# Patient Record
Sex: Female | Born: 1950 | Race: White | Hispanic: No | Marital: Married | Smoking: Former smoker
Health system: Southern US, Community
[De-identification: ages and names within clinical notes are randomized; demographics above are authoritative.]

## PROBLEM LIST (undated history)

## (undated) DIAGNOSIS — G4730 Sleep apnea, unspecified: Secondary | ICD-10-CM

## (undated) DIAGNOSIS — E119 Type 2 diabetes mellitus without complications: Secondary | ICD-10-CM

## (undated) DIAGNOSIS — M792 Neuralgia and neuritis, unspecified: Secondary | ICD-10-CM

## (undated) DIAGNOSIS — K589 Irritable bowel syndrome without diarrhea: Secondary | ICD-10-CM

## (undated) DIAGNOSIS — I10 Essential (primary) hypertension: Secondary | ICD-10-CM

## (undated) DIAGNOSIS — T8859XA Other complications of anesthesia, initial encounter: Secondary | ICD-10-CM

## (undated) DIAGNOSIS — T4145XA Adverse effect of unspecified anesthetic, initial encounter: Secondary | ICD-10-CM

## (undated) DIAGNOSIS — J189 Pneumonia, unspecified organism: Secondary | ICD-10-CM

## (undated) DIAGNOSIS — R519 Headache, unspecified: Secondary | ICD-10-CM

## (undated) DIAGNOSIS — R51 Headache: Secondary | ICD-10-CM

## (undated) HISTORY — PX: GASTRIC BYPASS: SHX52

## (undated) HISTORY — PX: FOOT SURGERY: SHX648

## (undated) HISTORY — PX: TUBAL LIGATION: SHX77

## (undated) HISTORY — PX: DILATION AND CURETTAGE OF UTERUS: SHX78

---

## 2002-11-20 ENCOUNTER — Ambulatory Visit (HOSPITAL_BASED_OUTPATIENT_CLINIC_OR_DEPARTMENT_OTHER): Admission: RE | Admit: 2002-11-20 | Discharge: 2002-11-20 | Payer: Self-pay | Admitting: *Deleted

## 2006-04-07 ENCOUNTER — Ambulatory Visit (HOSPITAL_COMMUNITY): Admission: RE | Admit: 2006-04-07 | Discharge: 2006-04-07 | Payer: Self-pay | Admitting: *Deleted

## 2006-04-07 ENCOUNTER — Encounter: Admission: RE | Admit: 2006-04-07 | Discharge: 2006-07-06 | Payer: Self-pay | Admitting: *Deleted

## 2006-04-12 ENCOUNTER — Ambulatory Visit (HOSPITAL_COMMUNITY): Admission: RE | Admit: 2006-04-12 | Discharge: 2006-04-12 | Payer: Self-pay | Admitting: *Deleted

## 2006-05-25 ENCOUNTER — Inpatient Hospital Stay (HOSPITAL_COMMUNITY): Admission: RE | Admit: 2006-05-25 | Discharge: 2006-05-27 | Payer: Self-pay | Admitting: *Deleted

## 2006-05-26 ENCOUNTER — Encounter: Payer: Self-pay | Admitting: Vascular Surgery

## 2006-05-31 ENCOUNTER — Encounter: Admission: RE | Admit: 2006-05-31 | Discharge: 2006-08-29 | Payer: Self-pay | Admitting: *Deleted

## 2006-11-29 ENCOUNTER — Encounter: Admission: RE | Admit: 2006-11-29 | Discharge: 2007-02-27 | Payer: Self-pay | Admitting: *Deleted

## 2007-06-13 ENCOUNTER — Encounter: Admission: RE | Admit: 2007-06-13 | Discharge: 2007-06-13 | Payer: Self-pay | Admitting: *Deleted

## 2010-11-21 ENCOUNTER — Other Ambulatory Visit: Payer: Self-pay

## 2010-11-21 ENCOUNTER — Other Ambulatory Visit: Payer: Self-pay | Admitting: Family Medicine

## 2010-11-21 DIAGNOSIS — R197 Diarrhea, unspecified: Secondary | ICD-10-CM

## 2010-11-24 ENCOUNTER — Ambulatory Visit
Admission: RE | Admit: 2010-11-24 | Discharge: 2010-11-24 | Disposition: A | Discharge status: Home / Self Care | Payer: PRIVATE HEALTH INSURANCE | Source: Ambulatory Visit | Attending: Family Medicine | Admitting: Family Medicine

## 2010-11-24 DIAGNOSIS — R197 Diarrhea, unspecified: Secondary | ICD-10-CM

## 2010-11-28 ENCOUNTER — Other Ambulatory Visit: Payer: Self-pay | Admitting: Internal Medicine

## 2010-11-28 DIAGNOSIS — R197 Diarrhea, unspecified: Secondary | ICD-10-CM

## 2010-12-05 ENCOUNTER — Other Ambulatory Visit: Payer: PRIVATE HEALTH INSURANCE

## 2010-12-05 NOTE — Op Note (Signed)
NAME:  Rachel White, Rachel White            ACCOUNT NO.:  [id number]   MEDICAL RECORD NO.:  [id number]          PATIENT TYPE:  INP   LOCATION:  0152                         FACILITY:  [facility]   PHYSICIAN:  Alfonse Ras, MD   DATE OF BIRTH:  06/23/1951   DATE OF PROCEDURE:  05/25/2006  DATE OF DISCHARGE:                               OPERATIVE REPORT   PREOPERATIVE DIAGNOSIS:  Medically refractory morbid obesity.   POSTOPERATIVE DIAGNOSIS:  Medically refractory morbid obesity.   PROCEDURE:  Laparoscopic Roux-En-Y gastric bypass, antegastric  antecolic, left facing Roux limb.   SURGEON:  Alfonse Ras, M.D.   ASSISTANT:  Rachel White. Rachel White, M.D.   ANESTHESIA:  General.   DESCRIPTION:  The patient was taken to the operating room and placed in  a supine position. After adequate general anesthesia was induced using  endotracheal tube, the abdomen was prepped and draped in a normal  sterile fashion. Using a small incision in the left upper quadrant and a  12 mm OptiVu, peritoneal access was obtained under direct vision.  Pneumoperitoneum was then obtained.  Additional 12 mm trocars were  placed in the left and mid abdomen.  There was a few amount of omental  adhesions to the anterior abdominal wall around the umbilicus and these  were taken down with the harmonic scalpel.  An additional 5 mm port was  placed in the left abdomen.  The ligament of Treitz was identified and  40 cm distal to that, the small bowel was transected using a 60-mm white  load GIA stapling device.  Additional mesentery was taken down with the  harmonic scalpel.  The distal end was marked with a Penrose drain.  I  counted an additional 100 cm distal to this and a side-to-side  jejunojejunostomy was performed in the standard fashion using a harmonic  scalpel.  There was a through-and-through injury to the posterior wall  of the distal small bowel which was extended to include the enterotomy.  The side-to-side  jejunojejunostomy was performed using a white load 60  mm GIA stapling device.  The defect was closed with running 2-0 Vicryl  sutures.  It was inspected.  It was reinforced with Tisseel.  The  mesenteric defect was closed with running 2-0 silk suture.   I then put the patient in a head-up position and placed a Nathanson  liver retractor to retract the left lateral segment of the liver.  The  angle of His was dissected using the Bovie electrocautery device.  An  area about 4-5 cm distal from the EG junction on the lesser curve was  identified.  The lesser sac was entered using the harmonic scalpel and  bluntly.  Using four serial firings of first gold load and then three  additional blue loads, accomplished the pouch.  Rachel White was in place and  verified the patency of the EG junction.   The Roux limb was then brought up next to the gastric pouch and the  posterior row was sewn with a running 2-0 Vicryl suture.  Of note, there  was a little bit of duskiness to the  tip of the Roux limb but on  inspection over about 2 to 2 1/2 hours, it appeared pink and was  peristalsising.  After the posterior row was placed, a side-to-side  gastrojejunostomy was performed in the standard fashion using a 45 mm  blue load stapling device.  That defect was closed with a running 2-0  Vicryl suture, as well.  The Ewald was then placed down through the  anastomosis and an anterior layer was placed using a running 2-0 Vicryl  suture.  The Rachel White was removed.  Dr. Ezzard White performed upper endoscopy  and we saw a small leak in the anterior portion of the anastomosis and  this was oversewn with interrupted 2-0 Vicryl suture.  On follow-up  endoscopy, there appeared to be a leak at the gastrojejunostomy right at  the left lateral segment.  This was oversewn with a U stitch of 2-0  Vicryl, as well.  On a third endoscopy by Dr. Ezzard White, there was no  evidence of any further bubbles or leaks.  The anastomosis was   reinforced with Tisseel.  Peterson's defect was closed with a running 2-  0 silk suture.  Of note, the gastric remnant was also oversewn partially  with a running interrupted locking 2-0 silk suture.  I did place a 19  Blake drain.  Adequate hemostasis was assured.  The trocars were  removed.  Pneumoperitoneum was released.  The skin was closed with  staples.  The patient tolerated the procedure well, went to PACU and  then to step down because of a history of CPAP.      Alfonse Ras, MD  Electronically Signed     KRE/MEDQ  D:  05/25/2006  T:  05/25/2006  Job:  [id number]

## 2011-10-27 ENCOUNTER — Ambulatory Visit: Payer: Self-pay | Admitting: Family Medicine

## 2011-10-27 VITALS — BP 178/82 | HR 64 | Temp 98.3°F | Resp 16 | Ht 63.5 in | Wt 155.0 lb

## 2011-10-27 DIAGNOSIS — R197 Diarrhea, unspecified: Secondary | ICD-10-CM

## 2011-10-27 MED ORDER — METRONIDAZOLE 250 MG PO TABS: 250.0000 mg | ORAL_TABLET | Freq: Three times a day (TID) | ORAL | Status: DC

## 2011-10-27 NOTE — Progress Notes (Signed)
61 yo unemployed woman with 6 weeks of diarrhea.  Tried cholestyramine, colon health, imodium AD, peptobismol, (probiotics).  Watery diarrhea at least 10 times a day.  Hyperactive BS, no cramps Incredible urgency. No recent antibiotics Becoming dizzy at times One brother died 10 years of cirrhosis, another brother is dying in [place] with cirrhosis, another brother with frostbite up in [place] Mom is doing well Depressed, tearful  O:  NAD Abdomen:  Soft, nontender, hyperactive BS, no mass or HSM  No jaundice  A:  Anxiety, depression, diarrhea  P:  Flagyl, citrocel

## 2011-10-27 NOTE — Patient Instructions (Signed)
Flagyl three times a day citrocel daily  Call if feelings of stress worsen or continue, or if the diarrhea is not resolving

## 2011-11-05 ENCOUNTER — Other Ambulatory Visit: Payer: Self-pay | Admitting: Family Medicine

## 2011-11-07 ENCOUNTER — Other Ambulatory Visit: Payer: Self-pay | Admitting: Family Medicine

## 2011-11-07 NOTE — Telephone Encounter (Signed)
Okay to refill rx x 1

## 2011-11-07 NOTE — Telephone Encounter (Signed)
Dr. Milus Glazier, Please advise [id number] this medication vs. Re-evaluate.

## 2012-02-20 ENCOUNTER — Other Ambulatory Visit: Payer: Self-pay | Admitting: Physician Assistant

## 2012-02-20 NOTE — Telephone Encounter (Signed)
Need chart

## 2012-02-21 NOTE — Telephone Encounter (Signed)
Chart pulled °

## 2012-03-17 ENCOUNTER — Ambulatory Visit: Payer: Self-pay | Admitting: Family Medicine

## 2012-04-28 ENCOUNTER — Ambulatory Visit: Payer: Self-pay | Admitting: Family Medicine

## 2012-08-01 ENCOUNTER — Encounter: Payer: Self-pay | Admitting: Family Medicine

## 2012-08-01 ENCOUNTER — Ambulatory Visit (INDEPENDENT_AMBULATORY_CARE_PROVIDER_SITE_OTHER): Payer: Managed Care, Other (non HMO) | Admitting: Family Medicine

## 2012-08-01 VITALS — BP 182/100 | HR 70 | Temp 98.8°F | Resp 16 | Ht 63.0 in | Wt 156.8 lb

## 2012-08-01 DIAGNOSIS — K529 Noninfective gastroenteritis and colitis, unspecified: Secondary | ICD-10-CM | POA: Insufficient documentation

## 2012-08-01 DIAGNOSIS — G629 Polyneuropathy, unspecified: Secondary | ICD-10-CM | POA: Insufficient documentation

## 2012-08-01 DIAGNOSIS — R197 Diarrhea, unspecified: Secondary | ICD-10-CM

## 2012-08-01 DIAGNOSIS — G609 Hereditary and idiopathic neuropathy, unspecified: Secondary | ICD-10-CM

## 2012-08-01 DIAGNOSIS — Z0000 Encounter for general adult medical examination without abnormal findings: Secondary | ICD-10-CM

## 2012-08-01 DIAGNOSIS — G4700 Insomnia, unspecified: Secondary | ICD-10-CM

## 2012-08-01 LAB — CBC
HCT: 45.7 % (ref 36.0–46.0)
Hemoglobin: 15.9 g/dL — ABNORMAL HIGH (ref 12.0–15.0)
MCH: 29.2 pg (ref 26.0–34.0)
MCHC: 34.8 g/dL (ref 30.0–36.0)
MCV: 83.9 fL (ref 78.0–100.0)
Platelets: 206 10*3/uL (ref 150–400)
RBC: 5.45 MIL/uL — ABNORMAL HIGH (ref 3.87–5.11)
RDW: 13.8 % (ref 11.5–15.5)
WBC: 7.2 10*3/uL (ref 4.0–10.5)

## 2012-08-01 LAB — COMPREHENSIVE METABOLIC PANEL
ALT: 22 U/L (ref 0–35)
AST: 20 U/L (ref 0–37)
Albumin: 4.7 g/dL (ref 3.5–5.2)
Alkaline Phosphatase: 53 U/L (ref 39–117)
BUN: 14 mg/dL (ref 6–23)
CO2: 31 mEq/L (ref 19–32)
Calcium: 9.5 mg/dL (ref 8.4–10.5)
Chloride: 104 mEq/L (ref 96–112)
Creat: 0.93 mg/dL (ref 0.50–1.10)
Glucose, Bld: 82 mg/dL (ref 70–99)
Potassium: 3.8 mEq/L (ref 3.5–5.3)
Sodium: 142 mEq/L (ref 135–145)
Total Bilirubin: 0.4 mg/dL (ref 0.3–1.2)
Total Protein: 6.9 g/dL (ref 6.0–8.3)

## 2012-08-01 LAB — POCT URINALYSIS DIPSTICK
Bilirubin, UA: NEGATIVE
Blood, UA: NEGATIVE
Glucose, UA: NEGATIVE
Leukocytes, UA: NEGATIVE
Nitrite, UA: NEGATIVE
Protein, UA: NEGATIVE
Spec Grav, UA: 1.02
Urobilinogen, UA: 0.2
pH, UA: 6

## 2012-08-01 LAB — LIPID PANEL
Cholesterol: 195 mg/dL (ref 0–200)
HDL: 34 mg/dL — ABNORMAL LOW (ref >39)
LDL Cholesterol: 136 mg/dL — ABNORMAL HIGH (ref 0–99)
Total CHOL/HDL Ratio: 5.7 Ratio
Triglycerides: 126 mg/dL (ref <150)
VLDL: 25 mg/dL (ref 0–40)

## 2012-08-01 MED ORDER — GABAPENTIN 300 MG PO CAPS: 300.0000 mg | ORAL_CAPSULE | Freq: Every evening | ORAL | Status: DC | PRN

## 2012-08-01 MED ORDER — DIPHENOXYLATE-ATROPINE 2.5-0.025 MG PO TABS: 1.0000 | ORAL_TABLET | Freq: Every day | ORAL | Status: DC | PRN

## 2012-08-01 MED ORDER — LORAZEPAM 1 MG PO TABS: 1.0000 mg | ORAL_TABLET | Freq: Every evening | ORAL | Status: DC | PRN

## 2012-08-01 NOTE — Progress Notes (Signed)
Patient ID: Rachel White MRN: [id number], DOB: Nov 16, 1950, 62 y.o. Date of Encounter: 08/01/2012, 1:20 PM  Primary Physician: Elvina Sidle, MD  Chief Complaint: Physical (CPE)  HPI: 62 y.o. y/o female with history of noted below here for CPE.  S/P bypass surgery 2007.  Weight has stabilized at 156 (she had lost to 142 and is now fighting to keep from gaining).  She has chronic diarrhea and uses lomotil and dicyclomine (Bentyl).  Does not believe in immunizations, colonoscopy, paps.  Has not had a mammogram either.   Review of Systems: Consitutional: No fever, chills, fatigue, night sweats, lymphadenopathy, or weight changes. Eyes: No visual changes, eye redness, or discharge. ENT/Mouth: Ears: No otalgia, tinnitus, hearing loss, discharge. Nose: No congestion, rhinorrhea, sinus pain, or epistaxis. Throat: No sore throat, post nasal drip, or teeth pain. Cardiovascular: No CP, palpitations, diaphoresis, DOE, edema, orthopnea, PND. Respiratory: No cough, hemoptysis, SOB, or wheezing. Gastrointestinal: No anorexia, dysphagia, reflux, pain, nausea, vomiting, hematemesis, constipation, BRBPR, or melena. Breast: No discharge, pain, swelling, or mass. Genitourinary: No dysuria, frequency, urgency, hematuria, incontinence, nocturia, amenorrhea, vaginal discharge, pruritis, burning, abnormal bleeding, or pain. Musculoskeletal: No decreased ROM, myalgias, stiffness, joint swelling, or weakness. Skin: No rash, erythema, lesion changes, pain, warmth, jaundice, or pruritis. Neurological: No headache, dizziness, syncope, seizures, tremors, memory loss, coordination problems, or paresthesias. Psychological: No anxiety, depression, hallucinations, SI/HI. Endocrine: No fatigue, polydipsia, polyphagia, polyuria, or known diabetes. All other systems were reviewed and are otherwise negative.  No past medical history on file.   No past surgical history on file.  Home Meds:  Prior to Admission  medications   Medication Sig Start Date End Date Taking? Authorizing Provider  CALCIUM PO Take by mouth daily.   Yes Historical Provider, MD  ferrous sulfate 325 (65 FE) MG tablet Take 325 mg by mouth daily with breakfast. Patient states she takes 65 mg   Yes Historical Provider, MD  Multiple Vitamin (MULTIVITAMIN) tablet Take 1 tablet by mouth daily.   Yes Historical Provider, MD  gabapentin (NEURONTIN) 300 MG capsule TAKE ONE CAPSULE AT BEDTIME 02/20/12   Morrell Riddle, PA-C  metroNIDAZOLE (FLAGYL) 250 MG tablet TAKE 1 TABLET (250 MG TOTAL) BY MOUTH 3 (THREE) TIMES DAILY. 11/07/11   Elvina Sidle, MD    Allergies:  Allergies  Allergen Reactions  . Sulfa Antibiotics   . Zolpidem Tartrate     History   Social History  . Marital Status: Married    Spouse Name: N/A    Number of Children: N/A  . Years of Education: N/A   Occupational History  . Not on file.   Social History Main Topics  . Smoking status: Former Smoker -- 1.0 packs/day for 40 years    Types: Cigarettes  . Smokeless tobacco: Not on file  . Alcohol Use: Not on file  . Drug Use: Not on file  . Sexually Active: Not on file   Other Topics Concern  . Not on file   Social History Narrative  . No narrative on file    No family history on file.  Physical Exam:  160/80 Blood pressure 182/100, pulse 70, temperature 98.8 F (37.1 C), temperature source Oral, resp. rate 16, height 5\' 3"  (1.6 m), weight 156 lb 12.8 oz (71.124 kg), SpO2 98.00%., Body mass index is 27.78 kg/(m^2). General: Well developed, well nourished, in no acute distress. HEENT: Normocephalic, atraumatic. Conjunctiva pink, sclera non-icteric. Pupils 2 mm constricting to 1 mm, round, regular, and equally reactive to light and accomodation.  EOMI. Internal auditory canal clear. TMs with good cone of light and without pathology. Nasal mucosa pink. Nares are without discharge. No sinus tenderness. Oral mucosa pink. Dentition= good. Pharynx without exudate.     Neck: Supple. Trachea midline. No thyromegaly. Full ROM. No lymphadenopathy. Lungs: Clear to auscultation bilaterally without wheezes, rales, or rhonchi. Breathing is of normal effort and unlabored. Cardiovascular: RRR with S1 S2. No murmurs, rubs, or gallops appreciated. Distal pulses 2+ symmetrically. No carotid or abdominal bruits. Breast: Symmetrical. No masses. Nipples without discharge. Abdomen: Soft, non-tender, non-distended with normoactive bowel sounds. No hepatosplenomegaly or masses. No rebound/guarding. No CVA tenderness. Without hernias.  Musculoskeletal: Full range of motion and 5/5 strength throughout. Without swelling, atrophy, tenderness, crepitus, or warmth. Extremities without clubbing, cyanosis, or edema. Calves supple. Skin: Warm and moist without erythema, ecchymosis, wounds, or rash. Neuro: A+Ox3. CN II-XII grossly intact. Moves all extremities spontaneously. Full sensation throughout. Normal gait. DTR 2+ throughout upper and lower extremities. Finger to nose intact. Psych:  Responds to questions appropriately with a normal affect.    Assessment/Plan:  62 y.o. y/o female here for CPE, refuses preventive care 1. Annual physical exam  CBC, POCT urinalysis dipstick, Comprehensive metabolic panel, Lipid panel  2. Peripheral neuropathy  gabapentin (NEURONTIN) 300 MG capsule  3. Chronic diarrhea  diphenoxylate-atropine (LOMOTIL) 2.5-0.025 MG per tablet  4. Insomnia  LORazepam (ATIVAN) 1 MG tablet    -  Signed, Elvina Sidle, MD 08/01/2012 1:20 PM

## 2012-08-01 NOTE — Patient Instructions (Addendum)

## 2012-08-01 NOTE — Progress Notes (Signed)
Patient ID: Rachel White MRN: [id number], DOB: [birth year], 62 y.o. Date of Encounter: 08/01/2012, 1:44 PM  Primary Physician: Elvina Sidle, MD  Chief Complaint: Physical (CPE)  HPI: 62 y.o. y/o female with history of noted below here for CPE.  Doing well. Recently came back from [place] where her brother with cirrhosis lives, and her mother (who ran off with another man during patient's childhood) lives after suffering from an MI Husband works at [employer], along with her son. [place] no longer works Her chronic diarrhea is controlled with lomotil and bentyl.  She refuses colonoscopy, pap, mammogram, and all immunizations.  She is here to retain her health insurance (required by [employer])  LMP: 12 year ago Pap: 20 year ago MMG: never Review of Systems: Consitutional: No fever, chills, fatigue, night sweats, lymphadenopathy, or weight changes. Eyes: No visual changes, eye redness, or discharge. ENT/Mouth: Ears: No otalgia, tinnitus, hearing loss, discharge. Nose: No congestion, rhinorrhea, sinus pain, or epistaxis. Throat: No sore throat, post nasal drip, or teeth pain. Cardiovascular: No CP, palpitations, diaphoresis, DOE, edema, orthopnea, PND. Respiratory: No cough, hemoptysis, SOB, or wheezing. Gastrointestinal: No anorexia, dysphagia, reflux, pain, nausea, vomiting, hematemesis, diarrhea, constipation, BRBPR, or melena. Breast: No discharge, pain, swelling, or mass. Genitourinary: No dysuria, frequency, urgency, hematuria, incontinence, nocturia, amenorrhea, vaginal discharge, pruritis, burning, abnormal bleeding, or pain. Musculoskeletal: No decreased ROM, myalgias, stiffness, joint swelling, or weakness. Skin: No rash, erythema, lesion changes, pain, warmth, jaundice, or pruritis. Neurological: No headache, dizziness, syncope, seizures, tremors, memory loss, coordination problems, or paresthesias. Psychological: No anxiety, depression, hallucinations,  SI/HI. Endocrine: No fatigue, polydipsia, polyphagia, polyuria, or known diabetes. All other systems were reviewed and are otherwise negative.  No past medical history on file.   No past surgical history on file.  Home Meds:  Prior to Admission medications   Medication Sig Start Date End Date Taking? Authorizing Provider  CALCIUM PO Take by mouth daily.   Yes Historical Provider, MD  DICYCLOMINE HCL PO Take by mouth as needed.   Yes Historical Provider, MD  ferrous sulfate 325 (65 FE) MG tablet Take 325 mg by mouth daily with breakfast. Patient states she takes 65 mg   Yes Historical Provider, MD  Multiple Vitamin (MULTIVITAMIN) tablet Take 1 tablet by mouth daily.   Yes Historical Provider, MD  diphenoxylate-atropine (LOMOTIL) 2.5-0.025 MG per tablet Take 1 tablet by mouth daily as needed for diarrhea or loose stools. 08/01/12   Elvina Sidle, MD  gabapentin (NEURONTIN) 300 MG capsule Take 1 capsule (300 mg total) by mouth at bedtime and may repeat dose one time if needed. 08/01/12   Elvina Sidle, MD  LORazepam (ATIVAN) 1 MG tablet Take 1 tablet (1 mg total) by mouth at bedtime and may repeat dose one time if needed. 08/01/12   Elvina Sidle, MD  metroNIDAZOLE (FLAGYL) 250 MG tablet TAKE 1 TABLET (250 MG TOTAL) BY MOUTH 3 (THREE) TIMES DAILY. 11/07/11   Elvina Sidle, MD    Allergies:  Allergies  Allergen Reactions  . Sulfa Antibiotics   . Zolpidem Tartrate     History   Social History  . Marital Status: Married    Spouse Name: N/A    Number of Children: N/A  . Years of Education: N/A   Occupational History  . Not on file.   Social History Main Topics  . Smoking status: Former Smoker -- 1.0 packs/day for 40 years    Types: Cigarettes  . Smokeless tobacco: Not on file  . Alcohol Use: Not  on file  . Drug Use: Not on file  . Sexually Active: Not on file   Other Topics Concern  . Not on file   Social History Narrative  . No narrative on file    No family  history on file.  Physical Exam:  160/80 on recheck Blood pressure 182/100, pulse 70, temperature 98.8 F (37.1 C), temperature source Oral, resp. rate 16, height 5\' 3"  (1.6 m), weight 156 lb 12.8 oz (71.124 kg), SpO2 98.00%., Body mass index is 27.78 kg/(m^2). General: Well developed, well nourished, in no acute distress. HEENT: Normocephalic, atraumatic. Conjunctiva pink, sclera non-icteric. Pupils 2 mm constricting to 1 mm, round, regular, and equally reactive to light and accomodation. EOMI. Internal auditory canal clear. TMs with good cone of light and without pathology. Nasal mucosa pink. Nares are without discharge. No sinus tenderness. Oral mucosa pink. Dentition missing few teeth, but remaining teeth appear healthy. Pharynx without exudate.   Neck: Supple. Trachea midline. No thyromegaly. Full ROM. No lymphadenopathy. Lungs: Clear to auscultation bilaterally without wheezes, rales, or rhonchi. Breathing is of normal effort and unlabored. Cardiovascular: RRR with S1 S2. No murmurs, rubs, or gallops appreciated. Distal pulses 2+ symmetrically. No carotid or abdominal bruits Abdomen: Soft, non-tender, non-distended with normoactive bowel sounds. No hepatosplenomegaly or masses. No rebound/guarding. No CVA tenderness. Without hernias.  Musculoskeletal: Full range of motion and 5/5 strength throughout. Without swelling, atrophy, tenderness, crepitus, or warmth. Extremities without clubbing, cyanosis, or edema. Calves supple. Skin: Warm and moist without erythema, ecchymosis, wounds, or rash. Neuro: A+Ox3. CN II-XII grossly intact. Moves all extremities spontaneously. Full sensation throughout. Normal gait. DTR 2+ throughout upper and lower extremities. Finger to nose intact. Psych:  Responds to questions appropriately with a normal affect.   Studies: CBC, CMET, Lipid, TSH, Vitamin b12  Assessment/Plan:  62 y.o. y/o female here for CPE who doesn't believe in preventive health opportunities 1.  Annual physical exam  CBC, POCT urinalysis dipstick, Comprehensive metabolic panel, Lipid panel  2. Peripheral neuropathy  gabapentin (NEURONTIN) 300 MG capsule, Vitamin B12, TSH  3. Chronic diarrhea  diphenoxylate-atropine (LOMOTIL) 2.5-0.025 MG per tablet  4. Insomnia  LORazepam (ATIVAN) 1 MG tablet   Signed, Elvina Sidle, MD 08/01/2012 1:44 PM

## 2012-08-02 LAB — TSH: TSH: 3.458 u[IU]/mL (ref 0.350–4.500)

## 2012-08-02 LAB — VITAMIN B12: Vitamin B-12: 454 pg/mL (ref 211–911)

## 2012-08-08 ENCOUNTER — Telehealth: Payer: Self-pay

## 2012-08-08 MED ORDER — NEOMYCIN-POLYMYXIN-HC 3.5-10000-1 OT SUSP: 3.0000 [drp] | Freq: Four times a day (QID) | OTIC | Status: DC

## 2012-08-08 NOTE — Telephone Encounter (Signed)
She does not want the metronidazole, she wants something for her ear, states she spoke to you about this when she was here, but I do not see this in the OV notes, Dione Mccombie

## 2012-08-08 NOTE — Telephone Encounter (Signed)
Thanks, I called patient to advise.  

## 2012-08-08 NOTE — Telephone Encounter (Signed)
I need to know what form of metronidazole she takes.  Is this for a skin condition?

## 2012-08-08 NOTE — Telephone Encounter (Signed)
Ok , its done !

## 2012-08-08 NOTE — Telephone Encounter (Signed)
We had this listed as a historical medication, is she taking this? What does she need it for? She states she does not need this one, she needs Rx for her ear, states she was having trouble with her ear, and you were to give her a med for this, but was not sent in for her. Please advise. Amy

## 2012-08-08 NOTE — Telephone Encounter (Signed)
PT was seen by Dr. Elbert Ewings last week, he was supposed to call in RX for metronidazole. This is on her AVS but [facility] [address] has no record of this. Pt came by this morning and I advised I would put in a phone msg. [phone]

## 2012-11-21 ENCOUNTER — Ambulatory Visit: Payer: Managed Care, Other (non HMO) | Admitting: Family Medicine

## 2012-11-21 ENCOUNTER — Encounter: Payer: Self-pay | Admitting: Family Medicine

## 2012-11-21 VITALS — BP 126/92 | HR 67 | Temp 98.1°F | Resp 16 | Ht 63.5 in | Wt 151.2 lb

## 2012-11-21 DIAGNOSIS — R0989 Other specified symptoms and signs involving the circulatory and respiratory systems: Secondary | ICD-10-CM

## 2012-11-21 DIAGNOSIS — G629 Polyneuropathy, unspecified: Secondary | ICD-10-CM

## 2012-11-21 DIAGNOSIS — R109 Unspecified abdominal pain: Secondary | ICD-10-CM

## 2012-11-21 MED ORDER — GABAPENTIN 300 MG PO CAPS: 300.0000 mg | ORAL_CAPSULE | Freq: Every evening | ORAL | Status: DC | PRN

## 2012-11-21 MED ORDER — PREGABALIN 100 MG PO CAPS: ORAL_CAPSULE | ORAL | Status: DC

## 2012-11-21 NOTE — Progress Notes (Signed)
62 yo woman with chronic cold intolerance, dry scaly and itchy skin upper backs and forearms, and right flank pain (recent).  Her mother had kidney failure.  Patient has been working in garden, Dietitian.  No diarrhea since December.  Occasional leg cramps.  Married, plans to visit brother for July in [place].  Peripheral neuropathy in feet persists.  Lyrica worked better than gabapentin but insurance wouldn't cover it in the past.  Now has different insurance  Objective:  NAD Abdomen:  Palpable aorta which is tender.  No HSM.    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  Peripheral neuropathy should improve with Lyrica if insurance will cover.    Plan: check abdominal ultrasound.  Patient will fill either neurontin or Lyrica, preferring the latter if covererd by [employer]  Peripheral neuropathy - Plan: gabapentin (NEURONTIN) 300 MG capsule, pregabalin (LYRICA) 100 MG capsule  Right flank pain - Plan: US Abdomen Complete  Palpable abd. aorta - Plan: US Abdomen Complete  Signed, Elvina Sidle, MD

## 2012-11-28 ENCOUNTER — Ambulatory Visit
Admission: RE | Admit: 2012-11-28 | Discharge: 2012-11-28 | Disposition: A | Discharge status: Home / Self Care | Payer: Managed Care, Other (non HMO) | Source: Ambulatory Visit | Attending: Family Medicine | Admitting: Family Medicine

## 2012-11-28 DIAGNOSIS — R0989 Other specified symptoms and signs involving the circulatory and respiratory systems: Secondary | ICD-10-CM

## 2012-11-28 DIAGNOSIS — R109 Unspecified abdominal pain: Secondary | ICD-10-CM

## 2013-12-10 ENCOUNTER — Ambulatory Visit: Payer: Self-pay | Admitting: Family Medicine

## 2013-12-10 VITALS — BP 148/72 | HR 89 | Temp 99.0°F | Resp 18 | Ht 63.0 in | Wt 153.0 lb

## 2013-12-10 DIAGNOSIS — R300 Dysuria: Secondary | ICD-10-CM

## 2013-12-10 DIAGNOSIS — R6883 Chills (without fever): Secondary | ICD-10-CM

## 2013-12-10 DIAGNOSIS — N390 Urinary tract infection, site not specified: Secondary | ICD-10-CM

## 2013-12-10 MED ORDER — CIPROFLOXACIN HCL 250 MG PO TABS: 250.0000 mg | ORAL_TABLET | Freq: Two times a day (BID) | ORAL | Status: DC

## 2013-12-10 NOTE — Progress Notes (Signed)
Patient ID: Rachel White MRN: [id number], DOB: [birth year], 63 y.o. Date of Encounter: 12/10/2013, 3:43 PM  Primary Physician: Robyn Haber, MD  Chief Complaint:  Chief Complaint  Patient presents with  . Urinary Tract Infection    today, bladder pressure and dysuria    HPI: 63 y.o. year old female presents with 1/2 day history of dysuria, urgency, and frequency. Last UTI was over 6 months ago No hematuria  No sick contacts, recent antibiotics, or recent travels.   No vaginal discharge, back pain, fever  History reviewed. No pertinent past medical history.   Home Meds: Prior to Admission medications   Medication Sig Start Date End Date Taking? Authorizing Provider  CALCIUM PO Take by mouth daily.   Yes Historical Provider, MD  DICYCLOMINE HCL PO Take by mouth as needed.   Yes Historical Provider, MD  diphenoxylate-atropine (LOMOTIL) 2.5-0.025 MG per tablet Take 1 tablet by mouth daily as needed for diarrhea or loose stools. 08/01/12  Yes Robyn Haber, MD  gabapentin (NEURONTIN) 300 MG capsule Take 1 capsule (300 mg total) by mouth at bedtime and may repeat dose one time if needed. 11/21/12  Yes Robyn Haber, MD  LORazepam (ATIVAN) 1 MG tablet Take 1 tablet (1 mg total) by mouth at bedtime and may repeat dose one time if needed. 08/01/12  Yes Robyn Haber, MD    Allergies:  Allergies  Allergen Reactions  . Sulfa Antibiotics   . Zolpidem Tartrate     History   Social History  . Marital Status: Married    Spouse Name: N/A    Number of Children: N/A  . Years of Education: N/A   Occupational History  . Not on file.   Social History Main Topics  . Smoking status: Former Smoker -- 1.00 packs/day for 40 years    Types: Cigarettes  . Smokeless tobacco: Not on file  . Alcohol Use: Not on file  . Drug Use: Not on file  . Sexual Activity: Not on file   Other Topics Concern  . Not on file   Social History Narrative  . No narrative on file      Review of Systems: Constitutional: negative for chills, fever, night sweats or weight changes Cardiovascular: negative for chest pain or palpitations Respiratory: negative for hemoptysis, wheezing, or shortness of breath Abdominal: negative for abdominal pain, nausea, vomiting or diarrhea Dermatological: negative for rash Neurologic: negative for headache   Physical Exam: Blood pressure 148/72, pulse 89, temperature 99 F (37.2 C), resp. rate 18, height 5\' 3"  (1.6 m), weight 153 lb (69.4 kg), SpO2 97.00%., Body mass index is 27.11 kg/(m^2). General: Well developed, well nourished, in no acute distress. Head: Normocephalic, atraumatic, eyes without discharge, sclera non-icteric, nares are congested. Bilateral auditory canals clear, TM's are without perforation, pearly grey with reflective cone of light bilaterally. Serous effusion bilaterally behind TM's. Maxillary sinus TTP. Oral cavity moist, dentition normal. Posterior pharynx with post nasal drip and mild erythema. No peritonsillar abscess or tonsillar exudate. Neck: Supple. No thyromegaly. Full ROM. No lymphadenopathy. Lungs: Coarse breath sounds bilaterally without Clear bilaterally to auscultation without wheezes, rales, or rhonchi. Breathing is unlabored.  Heart: RRR with S1 S2. No murmurs, rubs, or gallops appreciated. Abdomen: Soft, non-tender, non-distended with normoactive bowel sounds. No hepatosplenomegaly. No rebound/guarding. No obvious abdominal masses. McBurney's, Rovsing's, Iliopsoas, and table jar all negative. Msk:  Strength and tone normal for age. Extremities: No clubbing or cyanosis. No edema. Neuro: Alert and oriented X 3. Moves all  extremities spontaneously. CNII-XII grossly in tact. Psych:  Responds to questions appropriately with a normal affect.   Labs: Results for orders placed in visit on 08/01/12  CBC      Result Value Ref Range   WBC 7.2  4.0 - 10.5 K/uL   RBC 5.45 (*) 3.87 - 5.11 MIL/uL   Hemoglobin  15.9 (*) 12.0 - 15.0 g/dL   HCT 45.7  36.0 - 46.0 %   MCV 83.9  78.0 - 100.0 fL   MCH 29.2  26.0 - 34.0 pg   MCHC 34.8  30.0 - 36.0 g/dL   RDW 13.8  11.5 - 15.5 %   Platelets 206  150 - 400 K/uL  COMPREHENSIVE METABOLIC PANEL      Result Value Ref Range   Sodium 142  135 - 145 mEq/L   Potassium 3.8  3.5 - 5.3 mEq/L   Chloride 104  96 - 112 mEq/L   CO2 31  19 - 32 mEq/L   Glucose, Bld 82  70 - 99 mg/dL   BUN 14  6 - 23 mg/dL   Creat 0.93  0.50 - 1.10 mg/dL   Total Bilirubin 0.4  0.3 - 1.2 mg/dL   Alkaline Phosphatase 53  39 - 117 U/L   AST 20  0 - 37 U/L   ALT 22  0 - 35 U/L   Total Protein 6.9  6.0 - 8.3 g/dL   Albumin 4.7  3.5 - 5.2 g/dL   Calcium 9.5  8.4 - 10.5 mg/dL  LIPID PANEL      Result Value Ref Range   Cholesterol 195  0 - 200 mg/dL   Triglycerides 126  <150 mg/dL   HDL 34 (*) >39 mg/dL   Total CHOL/HDL Ratio 5.7     VLDL 25  0 - 40 mg/dL   LDL Cholesterol 136 (*) 0 - 99 mg/dL  VITAMIN B12      Result Value Ref Range   Vitamin B-12 454  211 - 911 pg/mL  TSH      Result Value Ref Range   TSH 3.458  0.350 - 4.500 uIU/mL  POCT URINALYSIS DIPSTICK      Result Value Ref Range   Color, UA yellow     Clarity, UA clear     Glucose, UA neg     Bilirubin, UA neg     Ketones, UA trace     Spec Grav, UA 1.020     Blood, UA neg     pH, UA 6.0     Protein, UA neg     Urobilinogen, UA 0.2     Nitrite, UA neg     Leukocytes, UA Negative        ASSESSMENT AND PLAN:  63 y.o. year old female with symptoms of UTI -Dysuria - Plan: ciprofloxacin (CIPRO) 250 MG tablet  Chills - Plan: ciprofloxacin (CIPRO) 250 MG tablet   -Mucinex -Tylenol/Motrin prn -Rest/fluids -RTC precautions -RTC 3-5 days if no improvement  Signed, Robyn Haber, MD 12/10/2013 3:43 PM

## 2013-12-10 NOTE — Patient Instructions (Signed)
Urinary Tract Infection  Urinary tract infections (UTIs) can develop anywhere along your urinary tract. Your urinary tract is your body's drainage system for removing wastes and extra water. Your urinary tract includes two kidneys, two ureters, a bladder, and a urethra. Your kidneys are a pair of bean-shaped organs. Each kidney is about the size of your fist. They are located below your ribs, one on each side of your spine.  CAUSES  Infections are caused by microbes, which are microscopic organisms, including fungi, viruses, and bacteria. These organisms are so small that they can only be seen through a microscope. Bacteria are the microbes that most commonly cause UTIs.  SYMPTOMS   Symptoms of UTIs may vary by age and gender of the patient and by the location of the infection. Symptoms in young women typically include a frequent and intense urge to urinate and a painful, burning feeling in the bladder or urethra during urination. Older women and men are more likely to be tired, shaky, and weak and have muscle aches and abdominal pain. A fever may mean the infection is in your kidneys. Other symptoms of a kidney infection include pain in your back or sides below the ribs, nausea, and vomiting.  DIAGNOSIS  To diagnose a UTI, your caregiver will ask you about your symptoms. Your caregiver also will ask to provide a urine sample. The urine sample will be tested for bacteria and white blood cells. White blood cells are made by your body to help fight infection.  TREATMENT   Typically, UTIs can be treated with medication. Because most UTIs are caused by a bacterial infection, they usually can be treated with the use of antibiotics. The choice of antibiotic and length of treatment depend on your symptoms and the type of bacteria causing your infection.  HOME CARE INSTRUCTIONS   If you were prescribed antibiotics, take them exactly as your caregiver instructs you. Finish the medication even if you feel better after you  have only taken some of the medication.   Drink enough water and fluids to keep your urine clear or pale yellow.   Avoid caffeine, tea, and carbonated beverages. They tend to irritate your bladder.   Empty your bladder often. Avoid holding urine for long periods of time.   Empty your bladder before and after sexual intercourse.   After a bowel movement, women should cleanse from front to back. Use each tissue only once.  SEEK MEDICAL CARE IF:    You have back pain.   You develop a fever.   Your symptoms do not begin to resolve within 3 days.  SEEK IMMEDIATE MEDICAL CARE IF:    You have severe back pain or lower abdominal pain.   You develop chills.   You have nausea or vomiting.   You have continued burning or discomfort with urination.  MAKE SURE YOU:    Understand these instructions.   Will watch your condition.   Will get help right away if you are not doing well or get worse.  Document Released: 04/15/2005 Document Revised: 01/05/2012 Document Reviewed: 08/14/2011  [employer] Patient Information 2014 [employer]

## 2013-12-15 ENCOUNTER — Telehealth: Payer: Self-pay

## 2013-12-15 NOTE — Telephone Encounter (Signed)
LM for pt to RTC- we can not give a refill on this medication. Last OV states to RTC in 5 days if no improvement.

## 2013-12-15 NOTE — Telephone Encounter (Signed)
Patient is requesting a refill on Ciprofloxacin HCL 250 MG. [facility] on [address]. Patient request for her prescription to be filled by Thursday July 4th.

## 2014-06-06 ENCOUNTER — Ambulatory Visit (INDEPENDENT_AMBULATORY_CARE_PROVIDER_SITE_OTHER): Payer: No Typology Code available for payment source | Admitting: Emergency Medicine

## 2014-06-06 VITALS — BP 160/90 | HR 73 | Temp 98.7°F | Resp 18 | Ht 63.25 in | Wt 152.0 lb

## 2014-06-06 DIAGNOSIS — R6883 Chills (without fever): Secondary | ICD-10-CM

## 2014-06-06 DIAGNOSIS — R300 Dysuria: Secondary | ICD-10-CM

## 2014-06-06 DIAGNOSIS — N3000 Acute cystitis without hematuria: Secondary | ICD-10-CM

## 2014-06-06 LAB — POCT URINALYSIS DIPSTICK
BILIRUBIN UA: NEGATIVE
Glucose, UA: NEGATIVE
KETONES UA: NEGATIVE
NITRITE UA: NEGATIVE
PROTEIN UA: 100
Spec Grav, UA: 1.02
Urobilinogen, UA: 1
pH, UA: 7

## 2014-06-06 LAB — POCT UA - MICROSCOPIC ONLY
Casts, Ur, LPF, POC: NEGATIVE
Crystals, Ur, HPF, POC: NEGATIVE
MUCUS UA: NEGATIVE
YEAST UA: NEGATIVE

## 2014-06-06 MED ORDER — PHENAZOPYRIDINE HCL 200 MG PO TABS: 200.0000 mg | ORAL_TABLET | Freq: Three times a day (TID) | ORAL | Status: DC | PRN

## 2014-06-06 MED ORDER — CIPROFLOXACIN HCL 250 MG PO TABS: 250.0000 mg | ORAL_TABLET | Freq: Two times a day (BID) | ORAL | Status: DC

## 2014-06-06 NOTE — Patient Instructions (Signed)

## 2014-06-06 NOTE — Progress Notes (Signed)
[facility] [address], [place] [address] [phone]  Date:  06/06/2014   Name:  Rachel White   DOB:  03/02/1951   MRN:  [id number]  PCP:  Robyn Haber, MD    Chief Complaint: Urinary Tract Infection; Urinary Frequency; and Urinary Urgency   History of Present Illness:  Rachel White is a 63 y.o. very pleasant female patient who presents with the following:  History of gastric bypass and lost sufficient weight her blood pressure drop and now off medications Has had elevated pressure and is resistant to taking antihypertensives again Now to the office with dysuria, urgency and frequency No nausea or vomiting. No stool change.  No rash No fever or chills. No improvement with over the counter medications or other home remedies.  Denies other complaint or health concern today.   Patient Active Problem List   Diagnosis Date Noted  . Peripheral neuropathy 08/01/2012  . Insomnia 08/01/2012  . Chronic diarrhea 08/01/2012    No past medical history on file.  No past surgical history on file.  History  Substance Use Topics  . Smoking status: Former Smoker -- 1.00 packs/day for 40 years    Types: Cigarettes  . Smokeless tobacco: Never Used  . Alcohol Use: Not on file    No family history on file.  Allergies  Allergen Reactions  . Sulfa Antibiotics   . Zolpidem Tartrate     Medication list has been reviewed and updated.  Current Outpatient Prescriptions on File Prior to Visit  Medication Sig Dispense Refill  . CALCIUM PO Take by mouth daily.    . ciprofloxacin (CIPRO) 250 MG tablet Take 1 tablet (250 mg total) by mouth 2 (two) times daily. 10 tablet 0  . DICYCLOMINE HCL PO Take by mouth as needed.    . diphenoxylate-atropine (LOMOTIL) 2.5-0.025 MG per tablet Take 1 tablet by mouth daily as needed for diarrhea or loose stools. 90 tablet 3  . gabapentin (NEURONTIN) 300 MG capsule Take 1 capsule (300 mg total) by mouth at  bedtime and may repeat dose one time if needed. 90 capsule 3  . LORazepam (ATIVAN) 1 MG tablet Take 1 tablet (1 mg total) by mouth at bedtime and may repeat dose one time if needed. 180 tablet 1   No current facility-administered medications on file prior to visit.    Review of Systems:  As per HPI, otherwise negative.    Physical Examination: Filed Vitals:   06/06/14 0840  BP: 160/90  Pulse: 73  Temp: 98.7 F (37.1 C)  Resp: 18   Filed Vitals:   06/06/14 0840  Height: 5' 3.25" (1.607 m)  Weight: 152 lb (68.947 kg)   Body mass index is 26.7 kg/(m^2). Ideal Body Weight: Weight in (lb) to have BMI = 25: 142   GEN: WDWN, NAD, Non-toxic, Alert & Oriented x 3 HEENT: Atraumatic, Normocephalic.  Ears and Nose: No external deformity. EXTR: No clubbing/cyanosis/edema NEURO: Normal gait.  PSYCH: Normally interactive. Conversant. Not depressed or anxious appearing.  Calm demeanor.  BACK   No CVA tenderness Abd:  Soft not tender   Assessment and Plan: Acute cystitis cipro  Pyridium  Signed,  Ellison Carwin, MD   Results for orders placed or performed in visit on 06/06/14  POCT urinalysis dipstick  Result Value Ref Range   Color, UA yellow    Clarity, UA cloudy    Glucose, UA neg    Bilirubin, UA neg    Ketones,  UA neg    Spec Grav, UA 1.020    Blood, UA large    pH, UA 7.0    Protein, UA 100    Urobilinogen, UA 1.0    Nitrite, UA neg    Leukocytes, UA moderate (2+)   POCT UA - Microscopic Only  Result Value Ref Range   WBC, Ur, HPF, POC 2-4    RBC, urine, microscopic 9-16    Bacteria, U Microscopic trace    Mucus, UA neg    Epithelial cells, urine per micros 0-1    Crystals, Ur, HPF, POC neg    Casts, Ur, LPF, POC neg    Yeast, UA neg

## 2014-10-13 ENCOUNTER — Emergency Department (HOSPITAL_COMMUNITY)
Admission: EM | Admit: 2014-10-13 | Discharge: 2014-10-14 | Disposition: A | Discharge status: Home / Self Care | Payer: No Typology Code available for payment source | Attending: Emergency Medicine | Admitting: Emergency Medicine

## 2014-10-13 ENCOUNTER — Emergency Department (HOSPITAL_COMMUNITY): Payer: No Typology Code available for payment source

## 2014-10-13 ENCOUNTER — Encounter (HOSPITAL_COMMUNITY): Payer: Self-pay

## 2014-10-13 DIAGNOSIS — S82102A Unspecified fracture of upper end of left tibia, initial encounter for closed fracture: Secondary | ICD-10-CM | POA: Insufficient documentation

## 2014-10-13 DIAGNOSIS — I10 Essential (primary) hypertension: Secondary | ICD-10-CM | POA: Diagnosis not present

## 2014-10-13 DIAGNOSIS — W108XXA Fall (on) (from) other stairs and steps, initial encounter: Secondary | ICD-10-CM | POA: Insufficient documentation

## 2014-10-13 DIAGNOSIS — Y998 Other external cause status: Secondary | ICD-10-CM | POA: Diagnosis not present

## 2014-10-13 DIAGNOSIS — Y9289 Other specified places as the place of occurrence of the external cause: Secondary | ICD-10-CM | POA: Diagnosis not present

## 2014-10-13 DIAGNOSIS — Z720 Tobacco use: Secondary | ICD-10-CM | POA: Insufficient documentation

## 2014-10-13 DIAGNOSIS — E119 Type 2 diabetes mellitus without complications: Secondary | ICD-10-CM | POA: Insufficient documentation

## 2014-10-13 DIAGNOSIS — G629 Polyneuropathy, unspecified: Secondary | ICD-10-CM | POA: Insufficient documentation

## 2014-10-13 DIAGNOSIS — Y9389 Activity, other specified: Secondary | ICD-10-CM | POA: Insufficient documentation

## 2014-10-13 DIAGNOSIS — W19XXXA Unspecified fall, initial encounter: Secondary | ICD-10-CM

## 2014-10-13 DIAGNOSIS — Z79899 Other long term (current) drug therapy: Secondary | ICD-10-CM | POA: Diagnosis not present

## 2014-10-13 DIAGNOSIS — S8992XA Unspecified injury of left lower leg, initial encounter: Secondary | ICD-10-CM | POA: Diagnosis present

## 2014-10-13 DIAGNOSIS — S82142A Displaced bicondylar fracture of left tibia, initial encounter for closed fracture: Secondary | ICD-10-CM

## 2014-10-13 HISTORY — DX: Essential (primary) hypertension: I10

## 2014-10-13 HISTORY — DX: Type 2 diabetes mellitus without complications: E11.9

## 2014-10-13 HISTORY — DX: Neuralgia and neuritis, unspecified: M79.2

## 2014-10-13 MED ORDER — OXYCODONE-ACETAMINOPHEN 5-325 MG PO TABS: 1.0000 | ORAL_TABLET | Freq: Four times a day (QID) | ORAL | Status: DC | PRN

## 2014-10-13 MED ORDER — DOCUSATE SODIUM 100 MG PO CAPS: 100.0000 mg | ORAL_CAPSULE | Freq: Two times a day (BID) | ORAL | Status: DC

## 2014-10-13 MED ORDER — OXYCODONE-ACETAMINOPHEN 5-325 MG PO TABS: 1.0000 | ORAL_TABLET | Freq: Once | ORAL | Status: DC

## 2014-10-13 MED ORDER — ONDANSETRON HCL 4 MG/2ML IJ SOLN
4.0000 mg | Freq: Once | INTRAMUSCULAR | Status: AC
  Administered 2014-10-13: 4 mg via INTRAVENOUS
  Filled 2014-10-13: qty 2

## 2014-10-13 MED ORDER — OXYCODONE-ACETAMINOPHEN 5-325 MG PO TABS
2.0000 | ORAL_TABLET | Freq: Once | ORAL | Status: AC
  Administered 2014-10-13: 2 via ORAL
  Filled 2014-10-13: qty 2

## 2014-10-13 MED ORDER — HYDROMORPHONE HCL 1 MG/ML IJ SOLN
1.0000 mg | Freq: Once | INTRAMUSCULAR | Status: AC
  Administered 2014-10-13: 1 mg via INTRAVENOUS
  Filled 2014-10-13: qty 1

## 2014-10-13 NOTE — ED Notes (Signed)
Pt was coming down stairs at her home and slipped down last 2 stairs and landed on LT knee, feeling a pop.  EMS reports positive pedal pulses and +CMS, BP 156/102, 80, 18.

## 2014-10-13 NOTE — ED Provider Notes (Signed)
CSN: [id number]     Arrival date & time 10/13/14  1843 History   First MD Initiated Contact with Patient 10/13/14 1845     Chief Complaint  Patient presents with  . Knee Pain     (Consider location/radiation/quality/duration/timing/severity/associated sxs/prior Treatment) HPI Comments: Patient presents with complaint of left knee pain. Pain beginning acutely when patient fell down approximately 2 stairs. She states that she fell and sat on the stair however jammed her foot when she did this felt a crack in her knee. She was unable to bear weight and EMS was called to transport to the hospital. No treatments prior to arrival. Last oral intake at 5 PM. Patient has baseline neuropathy in her left foot from previous diabetes (DM cured with gastric bypass surgery). No previous knee surgeries. Patient given Percocet upon arrival to the emergency department. Course is constant. Movement makes the pain worse. Holding still makes it better.  Patient is a 64 y.o. female presenting with knee pain. The history is provided by the patient.  Knee Pain Associated symptoms: no back pain and no neck pain     Past Medical History  Diagnosis Date  . Hypertension   . Neuropathic pain   . Diabetes mellitus without complication    Past Surgical History  Procedure Laterality Date  . Gastric bypass    . Foot surgery    . Dilation and curettage of uterus     No family history on file. History  Substance Use Topics  . Smoking status: Current Every Day Smoker -- 0.50 packs/day for 40 years    Types: Cigarettes  . Smokeless tobacco: Never Used  . Alcohol Use: No   OB History    No data available     Review of Systems  Constitutional: Negative for activity change.  HENT: Negative for rhinorrhea and sore throat.   Eyes: Negative for redness.  Respiratory: Negative for cough.   Cardiovascular: Negative for chest pain.  Gastrointestinal: Negative for nausea, vomiting, abdominal pain and diarrhea.   Genitourinary: Negative for dysuria.  Musculoskeletal: Positive for joint swelling, arthralgias and gait problem. Negative for myalgias, back pain and neck pain.  Skin: Negative for rash and wound.  Neurological: Negative for weakness, numbness and headaches.    Allergies  Sulfa antibiotics and Zolpidem tartrate  Home Medications   Prior to Admission medications   Medication Sig Start Date End Date Taking? Authorizing Provider  CALCIUM PO Take 2 tablets by mouth daily.    Yes Historical Provider, MD  lisinopril (PRINIVIL,ZESTRIL) 10 MG tablet Take 10 mg by mouth daily.   Yes Historical Provider, MD  LORazepam (ATIVAN) 1 MG tablet Take 1 tablet (1 mg total) by mouth at bedtime and may repeat dose one time if needed. 08/01/12  Yes Robyn Haber, MD  MELATONIN PO Take 1 tablet by mouth at bedtime.   Yes Historical Provider, MD  pregabalin (LYRICA) 50 MG capsule Take 50 mg by mouth 3 (three) times daily.   Yes Historical Provider, MD  ciprofloxacin (CIPRO) 250 MG tablet Take 1 tablet (250 mg total) by mouth 2 (two) times daily. 06/06/14   Roselee Culver, MD  diphenoxylate-atropine (LOMOTIL) 2.5-0.025 MG per tablet Take 1 tablet by mouth daily as needed for diarrhea or loose stools. 08/01/12   Robyn Haber, MD  gabapentin (NEURONTIN) 300 MG capsule Take 1 capsule (300 mg total) by mouth at bedtime and may repeat dose one time if needed. 11/21/12   Robyn Haber, MD  phenazopyridine (PYRIDIUM)  200 MG tablet Take 1 tablet (200 mg total) by mouth 3 (three) times daily as needed. 06/06/14   Roselee Culver, MD   BP 188/86 mmHg  Pulse 73  Temp(Src) 99.3 F (37.4 C) (Oral)  Resp 16  Ht 5\' 3"  (1.6 m)  Wt 156 lb (70.761 kg)  BMI 27.64 kg/m2  SpO2 100%   Physical Exam  Constitutional: She appears well-developed and well-nourished.  HENT:  Head: Normocephalic and atraumatic.  Eyes: Conjunctivae are normal. Pupils are equal, round, and reactive to light. Right eye exhibits no  discharge. Left eye exhibits no discharge.  Neck: Normal range of motion. Neck supple.  Cardiovascular: Normal rate, regular rhythm and normal heart sounds.  Exam reveals no decreased pulses.   Pulses:      Dorsalis pedis pulses are 2+ on the right side, and 2+ on the left side.       Posterior tibial pulses are 2+ on the right side, and 2+ on the left side.  Pulmonary/Chest: Effort normal and breath sounds normal.  Abdominal: Soft. There is no tenderness.  Musculoskeletal: She exhibits tenderness. She exhibits no edema.       Left hip: Normal.       Left knee: She exhibits decreased range of motion, swelling, effusion and bony tenderness. She exhibits no deformity. Tenderness found.       Left ankle: Normal.       Left upper leg: Normal.       Left lower leg: Normal.  Compartments of L leg are soft.   Neurological: She is alert. No sensory deficit.  Motor, sensation, and vascular distal to the injury is fully intact.   Skin: Skin is warm and dry.  Psychiatric: She has a normal mood and affect.  Nursing note and vitals reviewed.   ED Course  Procedures (including critical care time) Labs Review Labs Reviewed - No data to display  Imaging Review Dg Knee Complete 4 Views Left  10/13/2014   CLINICAL DATA:  [place] [address] down stairs.  Landing on left knee.  EXAM: LEFT KNEE - COMPLETE 4+ VIEW  COMPARISON:  None.  FINDINGS: There is a fracture through the proximal left tibia extending in the region of the tibial spines into the medial metaphysis. Minimal displacement of the medial fragment. Low large joint effusion with lipohemarthrosis.  IMPRESSION: Tibial plateau fracture and extending into the joint at the tibial spines.   Electronically Signed   By: Rolm Baptise M.D.   On: 10/13/2014 20:12     EKG Interpretation None       8:26 PM Patient seen and examined. Work-up initiated. Medications ordered.   Vital signs reviewed and are as follows: BP 188/86 mmHg  Pulse 73  Temp(Src) 99.3 F  (37.4 C) (Oral)  Resp 16  Ht 5\' 3"  (1.6 m)  Wt 156 lb (70.761 kg)  BMI 27.64 kg/m2  SpO2 100%  9:06 PM Pt d/w Dr. Wilson Singer. Spoke with Dr. Erlinda Hong. He requests CT of knee, patient made nonweightbearing with knee immobilizer and crutches, family to call for appointment on Monday (2 days). Patient informed of plan and she is in agreement. We'll give IV pain medications while she waits for her CT.  11:14 PM CT completed. Patient is receiving immobilizer and crutches. Percocet and Colace written for home. Instructions regarding nonweightbearing, elevation discussed with patient and family at bedside. She will call Dr. Phoebe Sharps office in 2 days for a follow-up appointment.  Exam unchanged. No signs of compartment syndrome at  discharge.  MDM   Final diagnoses:  Fall  Tibial plateau fracture, left, closed, initial encounter   Patient with tibial plateau fracture. Orthopedic follow-up arranged as above. Lower extremity is neurovascularly intact.    Carlisle Cater, PA-C 10/13/14 2316  Virgel Manifold, MD 10/14/14 [phone]

## 2014-10-13 NOTE — Discharge Instructions (Signed)
Please read and follow all provided instructions.  Your diagnoses today include:  1. Tibial plateau fracture, left, closed, initial encounter   2. Fall     Tests performed today include:  An x-ray/CT of the affected area - shows tibial plateau fracture  Vital signs. See below for your results today.   Medications prescribed:   Percocet (oxycodone/acetaminophen) - narcotic pain medication  DO NOT drive or perform any activities that require you to be awake and alert because this medicine can make you drowsy. BE VERY CAREFUL not to take multiple medicines containing Tylenol (also called acetaminophen). Doing so can lead to an overdose which can damage your liver and cause liver failure and possibly death.   Colace - stool softener  This medication can be found over-the-counter.   Take any prescribed medications only as directed.  Home care instructions:   Follow any educational materials contained in this packet  Do not bear weight on your leg, use the crutches at all times  Follow R.I.C.E. Protocol:  R - rest your injury   I  - use ice on injury without applying directly to skin  C - compress injury with bandage or splint  E - elevate the injury as much as possible  Follow-up instructions: Please follow-up with Dr. Erlinda Hong in 2 days.    Return instructions:   Please return if your toes or feet are numb or tingling, appear gray or blue, or you have severe pain (also elevate the leg and loosen splint or wrap if you were given one)  Please return to the Emergency Department if you experience worsening symptoms.   Please return if you have any other emergent concerns.  Additional Information:  Your vital signs today were: BP 156/71 mmHg   Pulse 68   Temp(Src) 99.3 F (37.4 C) (Oral)   Resp 15   Ht 5\' 3"  (1.6 m)   Wt 156 lb (70.761 kg)   BMI 27.64 kg/m2   SpO2 92% If your blood pressure (BP) was elevated above 135/85 this visit, please have this repeated by your doctor  within one month. --------------

## 2014-10-13 NOTE — ED Notes (Signed)
Fall down 1 one stare and jared knee.  Pt sts pain is 8/10 if she tries to move knee at this point.  Family at bedside.  Pt resting quietly.

## 2014-10-13 NOTE — ED Notes (Signed)
Pt gone to CT now

## 2014-10-16 ENCOUNTER — Other Ambulatory Visit (HOSPITAL_COMMUNITY): Payer: Self-pay | Admitting: Orthopaedic Surgery

## 2014-10-17 ENCOUNTER — Encounter (HOSPITAL_COMMUNITY): Payer: Self-pay | Admitting: *Deleted

## 2014-10-18 MED ORDER — CEFAZOLIN SODIUM-DEXTROSE 2-3 GM-% IV SOLR
2.0000 g | INTRAVENOUS | Status: AC
  Administered 2014-10-19: 2 g via INTRAVENOUS
  Filled 2014-10-18: qty 50

## 2014-10-18 NOTE — H&P (Signed)
PREOPERATIVE H&P  Chief Complaint: left tibial plateau fracture  HPI: Rachel White is a 64 y.o. female who presents for surgical treatment of left tibial plateau fracture.  She denies any changes in medical history.  Past Medical History  Diagnosis Date  . Hypertension   . Neuropathic pain   . Diabetes mellitus without complication     diet controlled  . Sleep apnea     does not use C_pap  . Pneumonia     as a child  . Headache     migraines years ago  . Irritable bowel syndrome     under control  . Complication of anesthesia     some difficulty waking up after tubes tied   Past Surgical History  Procedure Laterality Date  . Gastric bypass    . Foot surgery Left   . Dilation and curettage of uterus    . Tubal ligation     History   Social History  . Marital Status: Married    Spouse Name: N/A  . Number of Children: N/A  . Years of Education: N/A   Social History Main Topics  . Smoking status: Current Every Day Smoker -- 0.50 packs/day for 40 years    Types: Cigarettes  . Smokeless tobacco: Never Used  . Alcohol Use: No  . Drug Use: No  . Sexual Activity: Not on file   Other Topics Concern  . None   Social History Narrative   History reviewed. No pertinent family history. Allergies  Allergen Reactions  . Sulfa Antibiotics Other (See Comments)    Pt. Does not recall the reaction   . Zolpidem Tartrate Other (See Comments)    Altered mental status    Prior to Admission medications   Medication Sig Start Date End Date Taking? Authorizing Provider  CALCIUM PO Take 2 tablets by mouth daily.     Historical Provider, MD  ciprofloxacin (CIPRO) 250 MG tablet Take 1 tablet (250 mg total) by mouth 2 (two) times daily. 06/06/14   Roselee Culver, MD  diphenoxylate-atropine (LOMOTIL) 2.5-0.025 MG per tablet Take 1 tablet by mouth daily as needed for diarrhea or loose stools. 08/01/12   Robyn Haber, MD  docusate sodium (COLACE) 100 MG capsule Take 1  capsule (100 mg total) by mouth every 12 (twelve) hours. 10/13/14   Carlisle Cater, PA-C  gabapentin (NEURONTIN) 300 MG capsule Take 1 capsule (300 mg total) by mouth at bedtime and may repeat dose one time if needed. 11/21/12   Robyn Haber, MD  lisinopril (PRINIVIL,ZESTRIL) 10 MG tablet Take 10 mg by mouth daily.    Historical Provider, MD  LORazepam (ATIVAN) 1 MG tablet Take 1 tablet (1 mg total) by mouth at bedtime and may repeat dose one time if needed. 08/01/12   Robyn Haber, MD  MELATONIN PO Take 1 tablet by mouth at bedtime.    Historical Provider, MD  oxyCODONE-acetaminophen (PERCOCET/ROXICET) 5-325 MG per tablet Take 1-2 tablets by mouth every 6 (six) hours as needed for severe pain. 10/13/14   Carlisle Cater, PA-C  phenazopyridine (PYRIDIUM) 200 MG tablet Take 1 tablet (200 mg total) by mouth 3 (three) times daily as needed. 06/06/14   Roselee Culver, MD  pregabalin (LYRICA) 50 MG capsule Take 50 mg by mouth 3 (three) times daily.    Historical Provider, MD     Positive ROS: All other systems have been reviewed and were otherwise negative with the exception of those mentioned in the HPI and as  above.  Physical Exam: General: Alert, no acute distress Cardiovascular: No pedal edema Respiratory: No cyanosis, no use of accessory musculature GI: abdomen soft Skin: No lesions in the area of chief complaint Neurologic: Sensation intact distally Psychiatric: Patient is competent for consent with normal mood and affect Lymphatic: no lymphedema  MUSCULOSKELETAL: exam stable  Assessment: left tibial plateau fracture  Plan: Plan for Procedure(s): OPEN REDUCTION INTERNAL FIXATION (ORIF) LEFT BICONDYLAR TIBIAL PLATEAU  The risks benefits and alternatives were discussed with the patient including but not limited to the risks of nonoperative treatment, versus surgical intervention including infection, bleeding, nerve injury,  blood clots, cardiopulmonary complications, morbidity,  mortality, among others, and they were willing to proceed.   Marianna Payment, MD   10/18/2014 10:12 PM

## 2014-10-19 ENCOUNTER — Encounter (HOSPITAL_COMMUNITY)
Admission: RE | Disposition: A | Discharge status: Home / Self Care | Payer: Self-pay | Source: Ambulatory Visit | Attending: Orthopaedic Surgery

## 2014-10-19 ENCOUNTER — Ambulatory Visit (HOSPITAL_COMMUNITY): Payer: Medicare Other | Admitting: Anesthesiology

## 2014-10-19 ENCOUNTER — Observation Stay (HOSPITAL_COMMUNITY)
Admission: RE | Admit: 2014-10-19 | Discharge: 2014-10-20 | Disposition: A | Discharge status: Home / Self Care | Payer: Medicare Other | Source: Ambulatory Visit | Attending: Orthopaedic Surgery | Admitting: Orthopaedic Surgery

## 2014-10-19 ENCOUNTER — Ambulatory Visit (HOSPITAL_COMMUNITY): Payer: Medicare Other

## 2014-10-19 ENCOUNTER — Encounter (HOSPITAL_COMMUNITY): Payer: Self-pay | Admitting: *Deleted

## 2014-10-19 DIAGNOSIS — Z7982 Long term (current) use of aspirin: Secondary | ICD-10-CM | POA: Insufficient documentation

## 2014-10-19 DIAGNOSIS — G4730 Sleep apnea, unspecified: Secondary | ICD-10-CM | POA: Diagnosis not present

## 2014-10-19 DIAGNOSIS — M79605 Pain in left leg: Secondary | ICD-10-CM | POA: Diagnosis not present

## 2014-10-19 DIAGNOSIS — S82142A Displaced bicondylar fracture of left tibia, initial encounter for closed fracture: Secondary | ICD-10-CM | POA: Diagnosis not present

## 2014-10-19 DIAGNOSIS — I10 Essential (primary) hypertension: Secondary | ICD-10-CM | POA: Insufficient documentation

## 2014-10-19 DIAGNOSIS — Y998 Other external cause status: Secondary | ICD-10-CM | POA: Insufficient documentation

## 2014-10-19 DIAGNOSIS — G629 Polyneuropathy, unspecified: Secondary | ICD-10-CM | POA: Insufficient documentation

## 2014-10-19 DIAGNOSIS — Z882 Allergy status to sulfonamides status: Secondary | ICD-10-CM | POA: Diagnosis not present

## 2014-10-19 DIAGNOSIS — Z888 Allergy status to other drugs, medicaments and biological substances status: Secondary | ICD-10-CM | POA: Insufficient documentation

## 2014-10-19 DIAGNOSIS — Y9389 Activity, other specified: Secondary | ICD-10-CM | POA: Diagnosis not present

## 2014-10-19 DIAGNOSIS — Z9884 Bariatric surgery status: Secondary | ICD-10-CM | POA: Diagnosis not present

## 2014-10-19 DIAGNOSIS — S82143A Displaced bicondylar fracture of unspecified tibia, initial encounter for closed fracture: Secondary | ICD-10-CM | POA: Diagnosis present

## 2014-10-19 DIAGNOSIS — Y9289 Other specified places as the place of occurrence of the external cause: Secondary | ICD-10-CM | POA: Diagnosis not present

## 2014-10-19 DIAGNOSIS — K589 Irritable bowel syndrome without diarrhea: Secondary | ICD-10-CM | POA: Diagnosis not present

## 2014-10-19 DIAGNOSIS — E119 Type 2 diabetes mellitus without complications: Secondary | ICD-10-CM | POA: Insufficient documentation

## 2014-10-19 DIAGNOSIS — W108XXA Fall (on) (from) other stairs and steps, initial encounter: Secondary | ICD-10-CM | POA: Insufficient documentation

## 2014-10-19 DIAGNOSIS — F17210 Nicotine dependence, cigarettes, uncomplicated: Secondary | ICD-10-CM | POA: Diagnosis not present

## 2014-10-19 DIAGNOSIS — T148XXA Other injury of unspecified body region, initial encounter: Secondary | ICD-10-CM

## 2014-10-19 DIAGNOSIS — R262 Difficulty in walking, not elsewhere classified: Secondary | ICD-10-CM | POA: Diagnosis not present

## 2014-10-19 DIAGNOSIS — R52 Pain, unspecified: Secondary | ICD-10-CM

## 2014-10-19 HISTORY — PX: ORIF TIBIA PLATEAU: SHX2132

## 2014-10-19 HISTORY — DX: Irritable bowel syndrome, unspecified: K58.9

## 2014-10-19 HISTORY — DX: Sleep apnea, unspecified: G47.30

## 2014-10-19 HISTORY — DX: Headache: R51

## 2014-10-19 HISTORY — DX: Other complications of anesthesia, initial encounter: T88.59XA

## 2014-10-19 HISTORY — DX: Pneumonia, unspecified organism: J18.9

## 2014-10-19 HISTORY — DX: Adverse effect of unspecified anesthetic, initial encounter: T41.45XA

## 2014-10-19 HISTORY — DX: Headache, unspecified: R51.9

## 2014-10-19 LAB — BASIC METABOLIC PANEL
Anion gap: 11 (ref 5–15)
BUN: 23 mg/dL (ref 6–23)
CHLORIDE: 105 mmol/L (ref 96–112)
CO2: 24 mmol/L (ref 19–32)
Calcium: 9 mg/dL (ref 8.4–10.5)
Creatinine, Ser: 1.03 mg/dL (ref 0.50–1.10)
GFR calc Af Amer: 65 mL/min — ABNORMAL LOW (ref >90)
GFR calc non Af Amer: 56 mL/min — ABNORMAL LOW (ref >90)
GLUCOSE: 94 mg/dL (ref 70–99)
POTASSIUM: 3.9 mmol/L (ref 3.5–5.1)
Sodium: 140 mmol/L (ref 135–145)

## 2014-10-19 LAB — CBC
HEMATOCRIT: 40.9 % (ref 36.0–46.0)
Hemoglobin: 13.8 g/dL (ref 12.0–15.0)
MCH: 29.2 pg (ref 26.0–34.0)
MCHC: 33.7 g/dL (ref 30.0–36.0)
MCV: 86.7 fL (ref 78.0–100.0)
PLATELETS: 192 10*3/uL (ref 150–400)
RBC: 4.72 MIL/uL (ref 3.87–5.11)
RDW: 13.4 % (ref 11.5–15.5)
WBC: 8.7 10*3/uL (ref 4.0–10.5)

## 2014-10-19 LAB — GLUCOSE, CAPILLARY
GLUCOSE-CAPILLARY: 90 mg/dL (ref 70–99)
GLUCOSE-CAPILLARY: 81 mg/dL (ref 70–99)
GLUCOSE-CAPILLARY: 88 mg/dL (ref 70–99)

## 2014-10-19 SURGERY — OPEN REDUCTION INTERNAL FIXATION (ORIF) TIBIAL PLATEAU
Anesthesia: Regional | Site: Knee | Laterality: Left

## 2014-10-19 MED ORDER — PROMETHAZINE HCL 25 MG/ML IJ SOLN
6.2500 mg | INTRAMUSCULAR | Status: DC | PRN
  Administered 2014-10-19: 6.25 mg via INTRAVENOUS

## 2014-10-19 MED ORDER — ONDANSETRON HCL 4 MG/2ML IJ SOLN: 4.0000 mg | Freq: Four times a day (QID) | INTRAMUSCULAR | Status: DC | PRN

## 2014-10-19 MED ORDER — DEXAMETHASONE SODIUM PHOSPHATE 4 MG/ML IJ SOLN
INTRAMUSCULAR | Status: AC
  Filled 2014-10-19: qty 2

## 2014-10-19 MED ORDER — METOCLOPRAMIDE HCL 5 MG PO TABS: 5.0000 mg | ORAL_TABLET | Freq: Three times a day (TID) | ORAL | Status: DC | PRN

## 2014-10-19 MED ORDER — MIDAZOLAM HCL 5 MG/5ML IJ SOLN
INTRAMUSCULAR | Status: DC | PRN
  Administered 2014-10-19: 1 mg via INTRAVENOUS

## 2014-10-19 MED ORDER — FENTANYL CITRATE 0.05 MG/ML IJ SOLN
INTRAMUSCULAR | Status: AC
  Administered 2014-10-19: 50 ug via INTRAVENOUS
  Filled 2014-10-19: qty 2

## 2014-10-19 MED ORDER — SODIUM CHLORIDE 0.9 % IV SOLN
INTRAVENOUS | Status: DC
  Administered 2014-10-19: via INTRAVENOUS
  Administered 2014-10-19: 125 mL/h via INTRAVENOUS

## 2014-10-19 MED ORDER — ONDANSETRON HCL 4 MG/2ML IJ SOLN
4.0000 mg | Freq: Once | INTRAMUSCULAR | Status: AC | PRN
  Administered 2014-10-19: 4 mg via INTRAVENOUS

## 2014-10-19 MED ORDER — LORAZEPAM 1 MG PO TABS
1.0000 mg | ORAL_TABLET | Freq: Every evening | ORAL | Status: DC | PRN
  Administered 2014-10-19: 1 mg via ORAL
  Filled 2014-10-19: qty 1

## 2014-10-19 MED ORDER — ACETAMINOPHEN 325 MG PO TABS: 650.0000 mg | ORAL_TABLET | Freq: Four times a day (QID) | ORAL | Status: DC | PRN

## 2014-10-19 MED ORDER — ONDANSETRON HCL 4 MG/2ML IJ SOLN
INTRAMUSCULAR | Status: AC
  Filled 2014-10-19: qty 2

## 2014-10-19 MED ORDER — POLYETHYLENE GLYCOL 3350 17 G PO PACK: 17.0000 g | PACK | Freq: Every day | ORAL | Status: DC | PRN

## 2014-10-19 MED ORDER — ASPIRIN EC 325 MG PO TBEC
325.0000 mg | DELAYED_RELEASE_TABLET | Freq: Two times a day (BID) | ORAL | Status: DC
  Administered 2014-10-19 – 2014-10-20 (×2): 325 mg via ORAL
  Filled 2014-10-19 (×2): qty 1

## 2014-10-19 MED ORDER — DOCUSATE SODIUM 100 MG PO CAPS
100.0000 mg | ORAL_CAPSULE | Freq: Two times a day (BID) | ORAL | Status: DC
  Administered 2014-10-20: 100 mg via ORAL
  Filled 2014-10-19 (×2): qty 1

## 2014-10-19 MED ORDER — VECURONIUM BROMIDE 10 MG IV SOLR
INTRAVENOUS | Status: AC
  Filled 2014-10-19: qty 10

## 2014-10-19 MED ORDER — LACTATED RINGERS IV SOLN
INTRAVENOUS | Status: DC
  Administered 2014-10-19: 10:00:00 via INTRAVENOUS

## 2014-10-19 MED ORDER — DIPHENOXYLATE-ATROPINE 2.5-0.025 MG PO TABS: 1.0000 | ORAL_TABLET | Freq: Every day | ORAL | Status: DC | PRN

## 2014-10-19 MED ORDER — SCOPOLAMINE 1 MG/3DAYS TD PT72
MEDICATED_PATCH | TRANSDERMAL | Status: AC
  Filled 2014-10-19: qty 1

## 2014-10-19 MED ORDER — BUPIVACAINE HCL (PF) 0.25 % IJ SOLN
INTRAMUSCULAR | Status: AC
  Filled 2014-10-19: qty 30

## 2014-10-19 MED ORDER — ONDANSETRON HCL 4 MG/2ML IJ SOLN
INTRAMUSCULAR | Status: DC | PRN
  Administered 2014-10-19: 4 mg via INTRAVENOUS

## 2014-10-19 MED ORDER — OXYCODONE HCL 5 MG PO TABS: 5.0000 mg | ORAL_TABLET | ORAL | Status: DC | PRN

## 2014-10-19 MED ORDER — ROCURONIUM BROMIDE 50 MG/5ML IV SOLN
INTRAVENOUS | Status: AC
  Filled 2014-10-19: qty 1

## 2014-10-19 MED ORDER — ONDANSETRON HCL 4 MG/2ML IJ SOLN
INTRAMUSCULAR | Status: AC
  Administered 2014-10-19: 4 mg via INTRAVENOUS
  Filled 2014-10-19: qty 2

## 2014-10-19 MED ORDER — ONDANSETRON HCL 4 MG PO TABS: 4.0000 mg | ORAL_TABLET | Freq: Four times a day (QID) | ORAL | Status: DC | PRN

## 2014-10-19 MED ORDER — MIDAZOLAM HCL 2 MG/2ML IJ SOLN
1.0000 mg | INTRAMUSCULAR | Status: DC | PRN
  Administered 2014-10-19: 1 mg via INTRAVENOUS

## 2014-10-19 MED ORDER — FENTANYL CITRATE 0.05 MG/ML IJ SOLN
50.0000 ug | INTRAMUSCULAR | Status: DC | PRN
  Administered 2014-10-19: 50 ug via INTRAVENOUS

## 2014-10-19 MED ORDER — LACTATED RINGERS IV SOLN
INTRAVENOUS | Status: DC | PRN
  Administered 2014-10-19 (×2): via INTRAVENOUS

## 2014-10-19 MED ORDER — PREGABALIN 50 MG PO CAPS
50.0000 mg | ORAL_CAPSULE | Freq: Three times a day (TID) | ORAL | Status: DC
  Administered 2014-10-19 – 2014-10-20 (×4): 50 mg via ORAL
  Filled 2014-10-19 (×4): qty 1

## 2014-10-19 MED ORDER — PROPOFOL 10 MG/ML IV BOLUS
INTRAVENOUS | Status: DC | PRN
  Administered 2014-10-19: 130 mg via INTRAVENOUS

## 2014-10-19 MED ORDER — METHOCARBAMOL 1000 MG/10ML IJ SOLN
500.0000 mg | Freq: Four times a day (QID) | INTRAVENOUS | Status: DC | PRN
  Administered 2014-10-19: 500 mg via INTRAVENOUS
  Filled 2014-10-19 (×2): qty 5

## 2014-10-19 MED ORDER — ASPIRIN EC 325 MG PO TBEC: 325.0000 mg | DELAYED_RELEASE_TABLET | Freq: Two times a day (BID) | ORAL | Status: DC

## 2014-10-19 MED ORDER — HYDROMORPHONE HCL 1 MG/ML IJ SOLN
0.2500 mg | INTRAMUSCULAR | Status: DC | PRN
Start: 2014-10-19 — End: 2014-10-19
  Administered 2014-10-19 (×4): 0.5 mg via INTRAVENOUS

## 2014-10-19 MED ORDER — FENTANYL CITRATE 0.05 MG/ML IJ SOLN
INTRAMUSCULAR | Status: AC
  Filled 2014-10-19: qty 5

## 2014-10-19 MED ORDER — ACETAMINOPHEN 650 MG RE SUPP: 650.0000 mg | Freq: Four times a day (QID) | RECTAL | Status: DC | PRN

## 2014-10-19 MED ORDER — FENTANYL CITRATE 0.05 MG/ML IJ SOLN
INTRAMUSCULAR | Status: DC | PRN
  Administered 2014-10-19 (×2): 50 ug via INTRAVENOUS
  Administered 2014-10-19: 100 ug via INTRAVENOUS
  Administered 2014-10-19: 50 ug via INTRAVENOUS

## 2014-10-19 MED ORDER — HYDROMORPHONE HCL 1 MG/ML IJ SOLN
INTRAMUSCULAR | Status: AC
  Filled 2014-10-19: qty 1

## 2014-10-19 MED ORDER — OXYCODONE HCL 5 MG/5ML PO SOLN: 5.0000 mg | Freq: Once | ORAL | Status: DC | PRN

## 2014-10-19 MED ORDER — PROPOFOL 10 MG/ML IV BOLUS
INTRAVENOUS | Status: AC
  Filled 2014-10-19: qty 20

## 2014-10-19 MED ORDER — HYDROMORPHONE HCL 1 MG/ML IJ SOLN
INTRAMUSCULAR | Status: AC
  Administered 2014-10-19: 0.5 mg via INTRAVENOUS
  Filled 2014-10-19: qty 1

## 2014-10-19 MED ORDER — OXYCODONE HCL 5 MG PO TABS: 5.0000 mg | ORAL_TABLET | Freq: Once | ORAL | Status: DC | PRN

## 2014-10-19 MED ORDER — SCOPOLAMINE 1 MG/3DAYS TD PT72
MEDICATED_PATCH | TRANSDERMAL | Status: DC | PRN
  Administered 2014-10-19: 1 via TRANSDERMAL

## 2014-10-19 MED ORDER — MIDAZOLAM HCL 2 MG/2ML IJ SOLN
INTRAMUSCULAR | Status: AC
  Administered 2014-10-19: 1 mg via INTRAVENOUS
  Filled 2014-10-19: qty 2

## 2014-10-19 MED ORDER — OXYCODONE HCL 5 MG PO TABS
5.0000 mg | ORAL_TABLET | ORAL | Status: DC | PRN
  Administered 2014-10-19 – 2014-10-20 (×5): 10 mg via ORAL
  Filled 2014-10-19 (×5): qty 2

## 2014-10-19 MED ORDER — STERILE WATER FOR INJECTION IJ SOLN
INTRAMUSCULAR | Status: AC
  Filled 2014-10-19: qty 10

## 2014-10-19 MED ORDER — LIDOCAINE HCL (CARDIAC) 10 MG/ML IV SOLN
INTRAVENOUS | Status: DC | PRN
  Administered 2014-10-19: 60 mg via INTRAVENOUS

## 2014-10-19 MED ORDER — DIPHENHYDRAMINE HCL 12.5 MG/5ML PO ELIX: 25.0000 mg | ORAL_SOLUTION | ORAL | Status: DC | PRN

## 2014-10-19 MED ORDER — HYDROCODONE-ACETAMINOPHEN 7.5-325 MG PO TABS: 1.0000 | ORAL_TABLET | ORAL | Status: DC | PRN

## 2014-10-19 MED ORDER — KETOROLAC TROMETHAMINE 30 MG/ML IJ SOLN
INTRAMUSCULAR | Status: AC
  Filled 2014-10-19: qty 1

## 2014-10-19 MED ORDER — KETOROLAC TROMETHAMINE 30 MG/ML IJ SOLN: 30.0000 mg | Freq: Four times a day (QID) | INTRAMUSCULAR | Status: DC | PRN

## 2014-10-19 MED ORDER — BUPIVACAINE HCL (PF) 0.25 % IJ SOLN
INTRAMUSCULAR | Status: DC | PRN
  Administered 2014-10-19: 20 mL

## 2014-10-19 MED ORDER — LISINOPRIL 10 MG PO TABS
10.0000 mg | ORAL_TABLET | Freq: Every day | ORAL | Status: DC
  Administered 2014-10-20: 10 mg via ORAL
  Filled 2014-10-19: qty 1

## 2014-10-19 MED ORDER — METHOCARBAMOL 500 MG PO TABS
500.0000 mg | ORAL_TABLET | Freq: Four times a day (QID) | ORAL | Status: DC | PRN
  Administered 2014-10-20 (×2): 500 mg via ORAL
  Filled 2014-10-19 (×3): qty 1

## 2014-10-19 MED ORDER — SORBITOL 70 % SOLN: 30.0000 mL | Freq: Every day | Status: DC | PRN

## 2014-10-19 MED ORDER — DEXAMETHASONE SODIUM PHOSPHATE 4 MG/ML IJ SOLN
INTRAMUSCULAR | Status: DC | PRN
  Administered 2014-10-19: 8 mg via INTRAVENOUS

## 2014-10-19 MED ORDER — PROMETHAZINE HCL 25 MG/ML IJ SOLN
INTRAMUSCULAR | Status: AC
  Filled 2014-10-19: qty 1

## 2014-10-19 MED ORDER — LIDOCAINE HCL (CARDIAC) 20 MG/ML IV SOLN
INTRAVENOUS | Status: AC
  Filled 2014-10-19: qty 5

## 2014-10-19 MED ORDER — MORPHINE SULFATE 2 MG/ML IJ SOLN: 1.0000 mg | INTRAMUSCULAR | Status: DC | PRN

## 2014-10-19 MED ORDER — METOCLOPRAMIDE HCL 5 MG/ML IJ SOLN: 5.0000 mg | Freq: Three times a day (TID) | INTRAMUSCULAR | Status: DC | PRN

## 2014-10-19 MED ORDER — KETOROLAC TROMETHAMINE 30 MG/ML IJ SOLN
30.0000 mg | Freq: Once | INTRAMUSCULAR | Status: AC | PRN
  Administered 2014-10-19: 30 mg via INTRAVENOUS

## 2014-10-19 MED ORDER — CEFAZOLIN SODIUM-DEXTROSE 2-3 GM-% IV SOLR
2.0000 g | Freq: Four times a day (QID) | INTRAVENOUS | Status: AC
  Administered 2014-10-19 – 2014-10-20 (×3): 2 g via INTRAVENOUS
  Filled 2014-10-19 (×3): qty 50

## 2014-10-19 MED ORDER — MAGNESIUM CITRATE PO SOLN: 1.0000 | Freq: Once | ORAL | Status: AC | PRN

## 2014-10-19 MED ORDER — MIDAZOLAM HCL 2 MG/2ML IJ SOLN
INTRAMUSCULAR | Status: AC
  Filled 2014-10-19: qty 2

## 2014-10-19 SURGICAL SUPPLY — 60 items
BANDAGE ELASTIC 4 VELCRO ST LF (GAUZE/BANDAGES/DRESSINGS) ×2 IMPLANT
BANDAGE ELASTIC 6 VELCRO ST LF (GAUZE/BANDAGES/DRESSINGS) ×2 IMPLANT
BANDAGE ESMARK 6X9 LF (GAUZE/BANDAGES/DRESSINGS) ×1 IMPLANT
BIT DRILL 2.7 QC 7.9IN LONG (BIT) ×2 IMPLANT
BIT DRILL QC 2.7 6.3IN  SHORT (BIT) ×1
BIT DRILL QC 2.7 6.3IN SHORT (BIT) ×1 IMPLANT
BNDG COHESIVE 6X5 TAN STRL LF (GAUZE/BANDAGES/DRESSINGS) ×2 IMPLANT
BNDG ESMARK 6X9 LF (GAUZE/BANDAGES/DRESSINGS) ×2
COVER SURGICAL LIGHT HANDLE (MISCELLANEOUS) ×2 IMPLANT
DRAPE C-ARM 42X72 X-RAY (DRAPES) ×2 IMPLANT
DRAPE C-ARMOR (DRAPES) ×2 IMPLANT
DRAPE IMP U-DRAPE 54X76 (DRAPES) ×4 IMPLANT
DRAPE INCISE IOBAN 66X45 STRL (DRAPES) ×2 IMPLANT
DRAPE ORTHO SPLIT 77X108 STRL (DRAPES) ×2
DRAPE PROXIMA HALF (DRAPES) ×4 IMPLANT
DRAPE SURG ORHT 6 SPLT 77X108 (DRAPES) ×2 IMPLANT
DRAPE U-SHAPE 47X51 STRL (DRAPES) ×2 IMPLANT
DURAPREP 26ML APPLICATOR (WOUND CARE) ×2 IMPLANT
ELECT REM PT RETURN 9FT ADLT (ELECTROSURGICAL) ×2
ELECTRODE REM PT RTRN 9FT ADLT (ELECTROSURGICAL) ×1 IMPLANT
FACESHIELD STD STERILE (MASK) ×2 IMPLANT
GAUZE XEROFORM 1X8 LF (GAUZE/BANDAGES/DRESSINGS) ×2 IMPLANT
GLOVE NEODERM STRL 7.5 LF PF (GLOVE) ×1 IMPLANT
GLOVE SURG NEODERM 7.5  LF PF (GLOVE) ×1
GLOVE SURG SYN 7.5  E (GLOVE) ×1
GLOVE SURG SYN 7.5 E (GLOVE) ×1 IMPLANT
GOWN STRL REIN XL XLG (GOWN DISPOSABLE) ×6 IMPLANT
K-WIRE 2.0 (WIRE) ×3
K-WIRE TROCAR PT 2.0 150MM (WIRE) ×3
KIT BASIN OR (CUSTOM PROCEDURE TRAY) ×2 IMPLANT
KIT ROOM TURNOVER OR (KITS) ×2 IMPLANT
KWIRE TROCAR PT 2.0 150MM (WIRE) ×3 IMPLANT
MANIFOLD NEPTUNE II (INSTRUMENTS) ×2 IMPLANT
NS IRRIG 1000ML POUR BTL (IV SOLUTION) ×2 IMPLANT
PACK GENERAL/GYN (CUSTOM PROCEDURE TRAY) ×2 IMPLANT
PAD ABD 8X10 STRL (GAUZE/BANDAGES/DRESSINGS) ×2 IMPLANT
PAD ARMBOARD 7.5X6 YLW CONV (MISCELLANEOUS) ×4 IMPLANT
PAD CAST 4YDX4 CTTN HI CHSV (CAST SUPPLIES) ×1 IMPLANT
PADDING CAST COTTON 4X4 STRL (CAST SUPPLIES) ×1
PADDING CAST COTTON 6X4 STRL (CAST SUPPLIES) ×2 IMPLANT
PLATE PROX TIBIA 8HOLE (Plate) ×2 IMPLANT
SCREW CANCELLOUS 4.0X75 (Screw) ×4 IMPLANT
SCREW LOCK 3.5X30 (Screw) ×1 IMPLANT
SCREW LOCK 3.5X32 (Screw) ×4 IMPLANT
SCREW LOCK 3.5X34 (Screw) ×1 IMPLANT
SCREW LOCK 3.5X55 (Screw) ×2 IMPLANT
SCREW LOCK 3.5X70 (Screw) ×6 IMPLANT
SCREW LOCK T20 30X3.5XST CORT (Screw) ×1 IMPLANT
SCREW LOCK T20 34X3.5XST CORT (Screw) ×1 IMPLANT
SPONGE GAUZE 4X4 12PLY STER LF (GAUZE/BANDAGES/DRESSINGS) ×2 IMPLANT
STAPLER VISISTAT 35W (STAPLE) ×2 IMPLANT
SUT ETHILON 2 0 FS 18 (SUTURE) IMPLANT
SUT ETHILON 3 0 PS 1 (SUTURE) IMPLANT
SUT PDS AB 0 CT 36 (SUTURE) IMPLANT
SUT VIC AB 0 CT1 27 (SUTURE) ×2
SUT VIC AB 0 CT1 27XBRD ANBCTR (SUTURE) ×2 IMPLANT
SUT VIC AB 2-0 CT1 27 (SUTURE) ×1
SUT VIC AB 2-0 CT1 TAPERPNT 27 (SUTURE) ×1 IMPLANT
TOWEL OR 17X24 6PK STRL BLUE (TOWEL DISPOSABLE) ×2 IMPLANT
TOWEL OR 17X26 10 PK STRL BLUE (TOWEL DISPOSABLE) ×4 IMPLANT

## 2014-10-19 NOTE — Addendum Note (Signed)
Addendum  created 10/19/14 1444 by Lauretta Grill, MD   Modules edited: Orders, PRL Based Order Sets

## 2014-10-19 NOTE — Discharge Instructions (Signed)
1. Strict non weight bearing to left lower extremity 2. Wear brace when up 3. Do not need to wear brace when in sleeping 4. Change dressings in 2 days and place sterile gauze over incision and paper tape 5. Take aspirin to prevent blood clots

## 2014-10-19 NOTE — Anesthesia Procedure Notes (Addendum)
Anesthesia Regional Block:  Adductor canal block  Pre-Anesthetic Checklist: ,, timeout performed, Correct Patient, Correct Site, Correct Laterality, Correct Procedure, Correct Position, site marked, Risks and benefits discussed,  Surgical consent,  Pre-op evaluation,  At surgeon's request and post-op pain management  Laterality: Left  Prep: chloraprep       Needles:  Injection technique: Single-shot  Needle Type: Echogenic Stimulator Needle     Needle Length: 9cm 9 cm Needle Gauge: 22 and 22 G    Additional Needles:  Procedures: ultrasound guided (picture in chart) Adductor canal block Narrative:  Start time: 10/19/2014 11:50 AM End time: 10/19/2014 11:55 AM Injection made incrementally with aspirations every 5 mL.  Performed by: Personally   Additional Notes: 25 cc 0.5% Marcaine with 1:200 Epi injected easily   Procedure Name: LMA Insertion Date/Time: 10/19/2014 12:03 PM Performed by: Greggory Stallion, Accalia Rigdon L Pre-anesthesia Checklist: Patient identified, Emergency Drugs available, Suction available, Timeout performed and Patient being monitored Patient Re-evaluated:Patient Re-evaluated prior to inductionOxygen Delivery Method: Circle system utilized Preoxygenation: Pre-oxygenation with 100% oxygen Intubation Type: IV induction Ventilation: Mask ventilation without difficulty LMA: LMA inserted LMA Size: 4.0 Placement Confirmation: positive ETCO2 and breath sounds checked- equal and bilateral Tube secured with: Tape Dental Injury: Teeth and Oropharynx as per pre-operative assessment

## 2014-10-19 NOTE — OR Nursing (Signed)
[employer] notified of need for Bledsoe Locked brace 0-90.

## 2014-10-19 NOTE — Addendum Note (Signed)
Addendum  created 10/19/14 1440 by Kerby Less, CRNA   Modules edited: Anesthesia Attestations

## 2014-10-19 NOTE — Op Note (Signed)
Date of Surgery: 10/19/2014  INDICATIONS: Rachel White is a 64 y.o.-year-old female who sustained a left tibial plateau fracture; she was indicated for open reduction and internal fixation due to the displaced nature of the articular fracture and came to the operating room today for this procedure. The patient did consent to the procedure after discussion of the risks and benefits.  PREOPERATIVE DIAGNOSIS: left tibial plateau fracture (bicondylar).  POSTOPERATIVE DIAGNOSIS: Same.  PROCEDURE: Open treatment of left bicondylar tibial plateau fracture.  SURGEON: N. Eduard Roux, M.D.  ASSIST: Laure Kidney, RNFA, .  ANESTHESIA:  general  IV FLUIDS AND URINE: See anesthesia.  ESTIMATED BLOOD LOSS: minimal mL.  IMPLANTS: [employer] proximal tibial locking plate with 3.5 mm locking and nonlocking screws.   DRAINS: none  COMPLICATIONS: None.  DESCRIPTION OF PROCEDURE: The patient was brought to the operating room and placed supine on the operating table.  The patient had been signed prior to the procedure and this was documented. The patient had the anesthesia placed by the anesthesiologist.  The prep verification and incision time-outs were performed to confirm that this was the correct patient, site, side and location. The patient had an SCD on the opposite lower extremity. The patient did receive antibiotics prior to the incision and was re-dosed during the procedure as needed at indicated intervals.  The patient had the lower extremity prepped and draped in the standard surgical fashion.  The bony landmarks were palpated and the incision was drawn with a marker.  The incision was taken down through the skin and subcutaneous tissue with the knife down to the fascia. The fascia was then incised in line with the skin incision, taking care to extend through Gerdy's tubercle. The fascia was then taken anteriorly and posteriorly off of Gerdy's tubercle, and this exposed the joint capsule. The  anterior compartment was also gently elevated from distal to proximal off of the tibia to expose the fracture, taking care to leave the fascia available for later repair. We obtained a reduction of the plateau fracture by using a King tong clamp to squeeze the condyles together then this was provisionally fixed with a K wire. We then placed a lateral plate on the proximal tibia. Its placement was confirmed using fluoroscopy. We then pinned the plate in place and confirmed its placement both proximally and distally. We then placed a nonlocking screw in order to compress the fracture in the plate to the bone. This was done through the central proximal hole. This was placed as parallel to the joint surface as possible. Once this was done we then placed 2 locking rafting screws in the proximal holes. After this we swapped out the nonlocking screw for a locking screw. We then turned our attention to fixing the plate to the shaft. Through minimally invasive and percutaneous methods we placed 3 nonlocking screws through the shaft of the tibia in order to fix the distal portion of the plate to the bone. Finally we placed a kickstand screw under fluoroscopic guidance. This was a locking screw. Final x-rays were taken. The wound was thoroughly irrigated with normal saline. The IT band was closed with 0 Vicryl. Subcutaneous layer was closed with 2-0 Vicryl and the skin was closed with staples. Sterile dressings were applied. Patient was x-rayed and transferred to the PACU in stable condition. All sponge counts were correct.   POSTOPERATIVE PLAN: Rachel White will remain nonweightbearing on this leg for approximately 6 weeks; she will return for suture removal in  2 weeks.  The hinged knee brace will remain on at this time, but will remain completely unlocked.  Rachel White will receive DVT prophylaxis based on other medications, activity level, and risk ratio of bleeding to thrombosis.  Rachel Cecil, MD [place] 1:36 PM

## 2014-10-19 NOTE — Transfer of Care (Signed)
Immediate Anesthesia Transfer of Care Note  Patient: Rachel White  Procedure(s) Performed: Procedure(s): OPEN REDUCTION INTERNAL FIXATION (ORIF) LEFT BICONDYLAR TIBIAL PLATEAU (Left)  Patient Location: PACU  Anesthesia Type:General and GA combined with regional for post-op pain  Level of Consciousness: awake, alert , oriented and patient cooperative  Airway & Oxygen Therapy: Patient Spontanous Breathing and Patient connected to nasal cannula oxygen  Post-op Assessment: Report given to RN, Post -op Vital signs reviewed and stable and Patient moving all extremities  Post vital signs: Reviewed and stable  Last Vitals:  Filed Vitals:   10/19/14 1140  BP: 158/82  Pulse: 83  Temp:   Resp: 15    Complications: No apparent anesthesia complications

## 2014-10-19 NOTE — Progress Notes (Signed)
CRNA at bedside.

## 2014-10-19 NOTE — Anesthesia Postprocedure Evaluation (Signed)
  Anesthesia Post-op Note  Patient: Rachel White  Procedure(s) Performed: Procedure(s) (LRB): OPEN REDUCTION INTERNAL FIXATION (ORIF) LEFT BICONDYLAR TIBIAL PLATEAU (Left)  Patient Location: PACU  Anesthesia Type: General  Level of Consciousness: awake and alert   Airway and Oxygen Therapy: Patient Spontanous Breathing  Post-op Pain: mild  Post-op Assessment: Post-op Vital signs reviewed, Patient's Cardiovascular Status Stable, Respiratory Function Stable, Patent Airway and No signs of Nausea or vomiting  Last Vitals:  Filed Vitals:   10/19/14 1345  BP:   Pulse:   Temp: 37 C  Resp:     Post-op Vital Signs: stable   Complications: No apparent anesthesia complications

## 2014-10-19 NOTE — Anesthesia Preprocedure Evaluation (Signed)
Anesthesia Evaluation  Patient identified by MRN, date of birth, ID band Patient awake    Reviewed: Allergy & Precautions, NPO status , Patient's Chart, lab work & pertinent test results  Airway Mallampati: II  TM Distance: >3 FB Neck ROM: Full    Dental  (+) Teeth Intact, Dental Advisory Given   Pulmonary Current Smoker,  breath sounds clear to auscultation        Cardiovascular hypertension, Rhythm:Regular Rate:Normal     Neuro/Psych    GI/Hepatic   Endo/Other  diabetes  Renal/GU      Musculoskeletal   Abdominal   Peds  Hematology   Anesthesia Other Findings   Reproductive/Obstetrics                             Anesthesia Physical Anesthesia Plan  ASA: III  Anesthesia Plan: General   Post-op Pain Management:    Induction: Intravenous  Airway Management Planned: LMA  Additional Equipment:   Intra-op Plan:   Post-operative Plan:   Informed Consent: I have reviewed the patients History and Physical, chart, labs and discussed the procedure including the risks, benefits and alternatives for the proposed anesthesia with the patient or authorized representative who has indicated his/her understanding and acceptance.   Dental advisory given  Plan Discussed with: CRNA and Anesthesiologist  Anesthesia Plan Comments: (L. Tibial plateau Fracture Hypertension H/O Type 2 DM now resolved following gastric bypass glucose 90 S/P gastric bypass 2008 s/p 90 lb wt. Loss  Plan GA with LMA and adductor canal block.  Roberts Gaudy)        Anesthesia Quick Evaluation

## 2014-10-20 DIAGNOSIS — S82142A Displaced bicondylar fracture of left tibia, initial encounter for closed fracture: Secondary | ICD-10-CM | POA: Diagnosis not present

## 2014-10-20 NOTE — Patient Instructions (Signed)
ROM: Plantar / Dorsiflexion   With left leg relaxed, gently flex and extend  Both ankles. Move through full range of motion. Avoid pain. Repeat __10__ times per set. Do _1___ sets per session. Do __3__ sessions per day.  http://orth.exer.us/34   Copyright  VHI. All rights reserved.  AROM: Toe Curl   Sitting or lying with left heel supported, gently curl and straighten toes. Repeat ___10_ times per set. Do 1____ sets per session. Do __3__ sessions per day.  http://orth.exer.us/60   Copyright  VHI. All rights reserved.  Strengthening: Straight Leg Raise (Phase 1)   Tighten muscles on front of left thigh, then lift leg _4___ inches from surface, keeping knee locked.  Repeat __10__ times per set. Do _1___ sets per session. Do ___3_ sessions per day.  http://orth.exer.us/614   Copyright  VHI. All rights reserved.  Hip Abduction / Adduction: with Extended Knee (Supine)   Bring left leg out to side and return. Keep knee straight. Repeat _10___ times per set. Do _1___ sets per session. Do __3__ sessions per day.  http://orth.exer.us/680   Copyright  VHI. All rights reserved.

## 2014-10-20 NOTE — Evaluation (Signed)
Occupational Therapy Evaluation and Discharge Patient Details Name: Rachel White MRN: [id number] DOB: 12-12-1950 Today's Date: 10/20/2014    History of Present Illness Pt is a 64 y.o. Female s/p Left ORIF for tibial plateau fx sustained slipping on stairs at home.    Clinical Impression   PTA pt lived at home and was independent with use of crutches since her fx. Pt currently at min guard level for functional mobility and progressing towards Supervision. All education and training completed for safety with ADLs. Acute OT to sign off at this time.     Follow Up Recommendations  No OT follow up;Supervision/Assistance - 24 hour    Equipment Recommendations  3 in 1 bedside comode    Recommendations for Other Services       Precautions / Restrictions Precautions Required Braces or Orthoses: Other Brace/Splint Other Brace/Splint: Left bledsoe brace locked in extension; on when OOB Restrictions Weight Bearing Restrictions: Yes LLE Weight Bearing: Non weight bearing      Mobility Bed Mobility Overal bed mobility: Needs Assistance Bed Mobility: Supine to Sit     Supine to sit: Min guard;HOB elevated     General bed mobility comments: Min guard with HOB elevated and use of bed rail. Educated pt on use of bledsoe brace strapping to help progress LLE to EOB.   Transfers Overall transfer level: Needs assistance Equipment used: Rolling walker (2 wheeled) Transfers: Sit to/from Stand Sit to Stand: Supervision         General transfer comment: Supervision for safety. Pt able to maintain NWB status.          ADL Overall ADL's : Needs assistance/impaired                                       General ADL Comments: Pt at min guard level for functional mobility with ADLs. Sit<>stand supervision level and educated on use of RW for mobility. Pt ambulated to bathroom for toileting. Educated pt on use of BSC beside bed and in shower for safety with transfers.       Vision Vision Assessment?: No apparent visual deficits          Pertinent Vitals/Pain Pain Assessment: Faces Faces Pain Scale: Hurts little more Pain Location: LLE Pain Descriptors / Indicators: Grimacing (during movement) Pain Intervention(s): Limited activity within patient's tolerance;Monitored during session;Repositioned     Hand Dominance Right   Extremity/Trunk Assessment Upper Extremity Assessment Upper Extremity Assessment: Overall WFL for tasks assessed   Lower Extremity Assessment Lower Extremity Assessment: Defer to PT evaluation   Cervical / Trunk Assessment Cervical / Trunk Assessment: Normal   Communication Communication Communication: No difficulties   Cognition Arousal/Alertness: Awake/alert Behavior During Therapy: WFL for tasks assessed/performed Overall Cognitive Status: Within Functional Limits for tasks assessed                                Home Living Family/patient expects to be discharged to:: Private residence Living Arrangements: Children;Spouse/significant other Available Help at Discharge: Family;Available 24 hours/day (son will be home with pt for 1 week) Type of Home: Other(Comment) (townhouse) Home Access: Stairs to enter [employer] of Steps: curb +1   Home Layout: Two level;Able to live on main level with bedroom/bathroom (pt has futon in dining room)     Bathroom Shower/Tub: Tub/shower unit Shower/tub characteristics: Theatre stage manager  Toilet: Standard     Home Equipment: Crutches          Prior Functioning/Environment Level of Independence: Independent with assistive device(s)        Comments: pt was using crutches after fx.     OT Diagnosis: Generalized weakness;Acute pain    End of Session Equipment Utilized During Treatment: Gait belt;Rolling walker;Other (comment) (bledsoe brace)  Activity Tolerance: Patient tolerated treatment well Patient left: in chair;with call bell/phone  within reach   Time: 0840-0910 OT Time Calculation (min): 30 min Charges:  OT General Charges $OT Visit: 1 Procedure OT Evaluation $Initial OT Evaluation Tier I: 1 Procedure OT Treatments $Self Care/Home Management : 8-22 mins G-Codes: OT G-codes **NOT FOR INPATIENT CLASS** Functional Assessment Tool Used: clinical judgement Functional Limitation: Self care Self Care Current Status (O2703): At least 1 percent but less than 20 percent impaired, limited or restricted Self Care Goal Status (J0093): At least 1 percent but less than 20 percent impaired, limited or restricted Self Care Discharge Status [phone]): At least 1 percent but less than 20 percent impaired, limited or restricted  Juluis Rainier 10/20/2014, 9:27 AM  Cyndie Chime, OTR/L Occupational Therapist [phone] (pager)

## 2014-10-20 NOTE — Care Management Note (Signed)
CARE MANAGEMENT NOTE 10/20/2014  Patient:  Rachel White,Rachel White   Account Number:  [id number]  Date Initiated:  10/20/2014  Documentation initiated by:  Oliveras-Aizpurua,  Subjective/Objective Assessment:   63 yo F, s/p White ORIF for tibial plateau fx after slipping on stairs at home     Action/Plan:   PT is recommending supervision for mobility and OT a 3 in 1 BSC.   Anticipated DC Date:  10/20/2014   Anticipated DC Plan:  HOME/SELF CARE      DC Planning Services  CM consult      Choice offered to / List presented to:     DME arranged  3-N-1      DME agency  [facility]        Status of service:  Completed, signed off Medicare Important Message given?   (If response is "NO", the following Medicare IM given date fields will be blank) Date Medicare IM given:   Medicare IM given by:   Date Additional Medicare IM given:   Additional Medicare IM given by:    Discharge Disposition:    Per UR Regulation:    If discussed at Long Length of Stay Meetings, dates discussed:    Comments:  10/20/14 1500 -  Oliveras, RN, BSN  Met with pt and husband. She has the support of her husband. She has crutches and she needs a BSC. Contacted James at [facility] for DME referral.   

## 2014-10-20 NOTE — Evaluation (Signed)
Physical Therapy Evaluation Patient Details Name: Rachel White MRN: [id number] DOB: Dec 30, 1950 Today's Date: 10/20/2014   History of Present Illness  Pt is a 64 y.o. Female s/p Left ORIF for tibial plateau fx sustained slipping on stairs at home.   Clinical Impression  Pt is able to mobilize with axillary crutches and min guard assist.  She has family's assist at home and is safe enough to d/c when MD deems appropriate.  If she is here tomorrow, we will practice gait and the stairs again in an effort to increase independence and safety. HEP given for L leg (with no flexion exercises).   PT to follow acutely for deficits listed below.       Follow Up Recommendations No PT follow up;Supervision for mobility/OOB    Equipment Recommendations  None recommended by PT    Recommendations for Other Services   NA    Precautions / Restrictions Precautions Precautions: Fall Required Braces or Orthoses: Other Brace/Splint Other Brace/Splint: Left bledsoe brace locked in extension; on when OOB Restrictions LLE Weight Bearing: Non weight bearing      Mobility  Bed Mobility Overal bed mobility: Needs Assistance Bed Mobility: Supine to Sit     Supine to sit: Modified independent (Device/Increase time);HOB elevated     General bed mobility comments: Pt using her hands to progress left leg over EOB.  HOB ~45 degrees and pt using bed rail for leverage of trunk. Pt sleeps in the recliner chair at home.   Transfers Overall transfer level: Needs assistance Equipment used: None Transfers: Sit to/from [facility] Sit to Stand: Supervision Stand pivot transfers: Supervision       General transfer comment: supervision for safety due to NWB status of left leg. Pt relying on hands on bed rail and then chair armrests for stability during transfer.  Right shoe donned for OOB mobility.   Ambulation/Gait Ambulation/Gait assistance: Min guard Ambulation Distance (Feet): 10  Feet Assistive device: Crutches Gait Pattern/deviations: Step-to pattern (hop to gait pattern) Gait velocity: decreased Gait velocity interpretation: Below normal speed for age/gender General Gait Details: Pt able to show proficiency with crutches for gait.  Min guard assist for safety due to pt reports of increased pain.   Stairs Stairs: Yes Stairs assistance: Min assist Stair Management: No rails;Step to pattern;Forwards;With crutches Number of Stairs: 1 General stair comments: Hop to pattern to get up curb step. Verbal cues for safe crutch placement and technique.  Min assist to support trunk for balance while she hopped up the step.  I encouraged pt to have her husband stand behind her as she does the step up when she discharges home (she reports he would stand 10' away and hold the front door open.  "I guess I need to verbally communicate that I need his help."      Balance Overall balance assessment: Needs assistance Sitting-balance support: Feet supported;No upper extremity supported Sitting balance-Leahy Scale: Good     Standing balance support: Bilateral upper extremity supported Standing balance-Leahy Scale: Poor                               Pertinent Vitals/Pain Pain Assessment: 0-10 Pain Score: 8  Pain Location: left leg Pain Descriptors / Indicators: Burning;Aching (twitching) Pain Intervention(s): Limited activity within patient's tolerance;Monitored during session;Repositioned;RN gave pain meds during session (pt requested muscle relaxer, RN made aware and brought it )    Home Living Family/patient expects to  be discharged to:: Private residence Living Arrangements: Children;Spouse/significant other Available Help at Discharge: Family;Available 24 hours/day (son will be home with pt for 1 week) Type of Home: Other(Comment) (townhouse) Home Access: Stairs to enter   [employer] of Steps: curb +1 Home Layout: Two level;Able to live on  main level with bedroom/bathroom (pt has futon in dining room) Home Equipment: Crutches      Prior Function Level of Independence: Independent with assistive device(s)         Comments: pt was using crutches after fx. She would use crutches to get into and out of the house, but reports that she used a computer chair to wheel around the house with her son's assist.      Hand Dominance   Dominant Hand: Right    Extremity/Trunk Assessment   Upper Extremity Assessment: Defer to OT evaluation           Lower Extremity Assessment: LLE deficits/detail   LLE Deficits / Details: left leg with normal post op pain and weakness.  Pt with 2+/5 ankle DF, can wiggle her toes, quad NT due to [facility] locked in extension, hip flexion and abduction 2+/5.   Cervical / Trunk Assessment: Normal  Communication   Communication: No difficulties  Cognition Arousal/Alertness: Awake/alert Behavior During Therapy: WFL for tasks assessed/performed Overall Cognitive Status: Within Functional Limits for tasks assessed                         Exercises Total Joint Exercises Ankle Circles/Pumps: AROM;Both;10 reps;Supine Hip ABduction/ADduction: Left;AROM;10 reps;Supine Straight Leg Raises: AROM;Left;10 reps;Supine      Assessment/Plan    PT Assessment Patient needs continued PT services  PT Diagnosis Difficulty walking;Abnormality of gait;Generalized weakness;Acute pain   PT Problem List Decreased strength;Decreased activity tolerance;Decreased balance;Decreased mobility;Decreased knowledge of use of DME;Pain  PT Treatment Interventions DME instruction;Stair training;Gait training;Functional mobility training;Therapeutic activities;Therapeutic exercise;Balance training;Neuromuscular re-education;Patient/family education;Modalities   PT Goals (Current goals can be found in the Care Plan section) Acute Rehab PT Goals Patient Stated Goal: to go home today if able. PT Goal Formulation: With  patient Time For Goal Achievement: 10/27/14 Potential to Achieve Goals: Good    Frequency Min 5X/week    End of Session Equipment Utilized During Treatment: Gait belt Activity Tolerance: Patient limited by fatigue;Patient limited by pain Patient left: in bed;with call bell/phone within reach      Functional Assessment Tool Used: assist level Functional Limitation: Mobility: Walking and moving around Mobility: Walking and Moving Around Current Status (G6440): At least 20 percent but less than 40 percent impaired, limited or restricted Mobility: Walking and Moving Around Goal Status [phone]): At least 1 percent but less than 20 percent impaired, limited or restricted    Time: 1154-1229 PT Time Calculation (min) (ACUTE ONLY): 35 min   Charges:   PT Evaluation $Initial PT Evaluation Tier I: 1 Procedure PT Treatments $Gait Training: 8-22 mins   PT G Codes:   PT G-Codes **NOT FOR INPATIENT CLASS** Functional Assessment Tool Used: assist level Functional Limitation: Mobility: Walking and moving around Mobility: Walking and Moving Around Current Status (V9563): At least 20 percent but less than 40 percent impaired, limited or restricted Mobility: Walking and Moving Around Goal Status [phone]): At least 1 percent but less than 20 percent impaired, limited or restricted    Dashonda Bonneau B. [place], [place], DPT [phone]   10/20/2014, 1:34 PM

## 2014-10-20 NOTE — Progress Notes (Signed)
UR completed 

## 2014-10-20 NOTE — Progress Notes (Signed)
Patient is stable and pain well controlled.  Up with PT today.  NWB LLE.  Bledsoe brace when up.  Can take off while in bed.  Aspirin for DVT ppx.  Rx in chart. Can dc home today if pain controlled and does well with PT.  [name], MD [place] 8:09 AM

## 2014-10-20 NOTE — Progress Notes (Signed)
Orthopedic Tech Progress Note Patient Details:  Rachel White [birth year] [id number]  Patient ID: Rachel White, female   DOB: [birth year], 64 y.o.   MRN: [id number]   Rachel White 10/20/2014, 1:03 AMApplied over head frame

## 2014-10-20 NOTE — Discharge Summary (Signed)
Physician Discharge Summary      Patient ID: Rachel White MRN: [id number] DOB/AGE: 65-21-1952 65 y.o.  Admit date: 10/19/2014 Discharge date: 10/20/2014  Admission Diagnoses:  <principal problem not specified>  Discharge Diagnoses:  Active Problems:   Fracture of left tibial plateau   Tibial plateau fracture   Past Medical History  Diagnosis Date  . Hypertension   . Neuropathic pain   . Diabetes mellitus without complication     diet controlled  . Sleep apnea     does not use C_pap  . Pneumonia     as a child  . Headache     migraines years ago  . Irritable bowel syndrome     under control  . Complication of anesthesia     some difficulty waking up after tubes tied    Surgeries: Procedure(s): OPEN REDUCTION INTERNAL FIXATION (ORIF) LEFT BICONDYLAR TIBIAL PLATEAU on 10/19/2014   Consultants (if any):    Discharged Condition: Improved  Hospital Course: Daizee Firmin Carrell is an 64 y.o. female who was admitted 10/19/2014 with a diagnosis of <principal problem not specified> and went to the operating room on 10/19/2014 and underwent the above named procedures.    She was given perioperative antibiotics:  Anti-infectives    Start     Dose/Rate Route Frequency Ordered Stop   10/19/14 1730  ceFAZolin (ANCEF) IVPB 2 g/50 mL premix     2 g 100 mL/hr over 30 Minutes Intravenous Every 6 hours 10/19/14 1644 10/20/14 0541   10/19/14 0600  ceFAZolin (ANCEF) IVPB 2 g/50 mL premix     2 g 100 mL/hr over 30 Minutes Intravenous On call to O.R. 10/18/14 1211 10/19/14 1215    .  She was given sequential compression devices, early ambulation, and aspirin for DVT prophylaxis.  She benefited maximally from the hospital stay and there were no complications.    Recent vital signs:  Filed Vitals:   10/20/14 1423  BP: 144/72  Pulse: 70  Temp: 98.8 F (37.1 C)  Resp: 16    Recent laboratory studies:  Lab Results  Component Value Date   HGB 13.8 10/19/2014   HGB 15.9*  08/01/2012   Lab Results  Component Value Date   WBC 8.7 10/19/2014   PLT 192 10/19/2014   No results found for: INR Lab Results  Component Value Date   NA 140 10/19/2014   K 3.9 10/19/2014   CL 105 10/19/2014   CO2 24 10/19/2014   BUN 23 10/19/2014   CREATININE 1.03 10/19/2014   GLUCOSE 94 10/19/2014    Discharge Medications:     Medication List    TAKE these medications        aspirin EC 325 MG tablet  Take 1 tablet (325 mg total) by mouth 2 (two) times daily.     CALCIUM PO  Take 2 tablets by mouth daily.     ciprofloxacin 250 MG tablet  Commonly known as:  CIPRO  Take 1 tablet (250 mg total) by mouth 2 (two) times daily.     diphenoxylate-atropine 2.5-0.025 MG per tablet  Commonly known as:  LOMOTIL  Take 1 tablet by mouth daily as needed for diarrhea or loose stools.     docusate sodium 100 MG capsule  Commonly known as:  COLACE  Take 1 capsule (100 mg total) by mouth every 12 (twelve) hours.     gabapentin 300 MG capsule  Commonly known as:  NEURONTIN  Take 1 capsule (300 mg total) by  mouth at bedtime and may repeat dose one time if needed.     lisinopril 10 MG tablet  Commonly known as:  PRINIVIL,ZESTRIL  Take 10 mg by mouth daily.     LORazepam 1 MG tablet  Commonly known as:  ATIVAN  Take 1 tablet (1 mg total) by mouth at bedtime and may repeat dose one time if needed.     MELATONIN PO  Take 1 tablet by mouth at bedtime.     oxyCODONE 5 MG immediate release tablet  Commonly known as:  Oxy IR/ROXICODONE  Take 1-3 tablets (5-15 mg total) by mouth every 4 (four) hours as needed.     oxyCODONE-acetaminophen 5-325 MG per tablet  Commonly known as:  PERCOCET/ROXICET  Take 1-2 tablets by mouth every 6 (six) hours as needed for severe pain.     phenazopyridine 200 MG tablet  Commonly known as:  PYRIDIUM  Take 1 tablet (200 mg total) by mouth 3 (three) times daily as needed.     pregabalin 50 MG capsule  Commonly known as:  LYRICA  Take 50 mg by  mouth 3 (three) times daily.        Diagnostic Studies: Dg Knee 1-2 Views Left  10-23-14   CLINICAL DATA:  ORIF proximal tibia fracture.  EXAM: DG C-ARM 61-120 MIN; LEFT KNEE - 1-2 VIEW  FLUOROSCOPY TIME:  C-arm fluoroscopic images were obtained intraoperatively and submitted for post operative interpretation. Please see the performing provider's procedural report for the fluoroscopy time utilized.  COMPARISON:  Left knee radiographs and CT 10/13/2014  FINDINGS: Single frontal and lateral intraoperative spot fluoroscopic images of the left knee are provided. These demonstrate interval reduction and internal fixation of the proximal tibia fracture. Alignment appears grossly anatomic on these limited images. Lateral plate and screws are in place. The knee is located.  IMPRESSION: Intraoperative images during ORIF of left proximal tibia fracture.   Electronically Signed   By: Logan Bores   On: 23-Oct-2014 13:31   Ct Knee Left Wo Contrast  10/13/2014   CLINICAL DATA:  Evaluate tibial plateau fracture.  EXAM: CT OF THE LEFT KNEE WITHOUT CONTRAST  TECHNIQUE: Multidetector CT imaging of the left knee was performed according to the standard protocol. Multiplanar CT image reconstructions were also generated.  COMPARISON:  Plain films earlier today  FINDINGS: There is a proximal tibial fracture. Fracture noted through the lateral tibial plateau, extending to the tibial spines, then extending distally into the medial tibial metaphysis. No fibular or femur abnormality.  IMPRESSION: Lateral tibial plateau fracture extending to the tibial spines, thin distally to the medial tibial metaphysis.  Lipohemarthrosis.   Electronically Signed   By: Rolm Baptise M.D.   On: 10/13/2014 23:29   Dg Knee Complete 4 Views Left  10/13/2014   CLINICAL DATA:  [place] down stairs.  Landing on left knee.  EXAM: LEFT KNEE - COMPLETE 4+ VIEW  COMPARISON:  None.  FINDINGS: There is a fracture through the proximal left tibia extending in the  region of the tibial spines into the medial metaphysis. Minimal displacement of the medial fragment. Low large joint effusion with lipohemarthrosis.  IMPRESSION: Tibial plateau fracture and extending into the joint at the tibial spines.   Electronically Signed   By: Rolm Baptise M.D.   On: 10/13/2014 20:12   Dg Ankle Left Port  10/23/2014   CLINICAL DATA:  Left ankle pain secondary to a fall.  EXAM: PORTABLE LEFT ANKLE - 2 VIEW  COMPARISON:  None.  FINDINGS: There is no fracture or dislocation. There is small ankle effusion. Screw in the base of the fifth metatarsal.  IMPRESSION: No acute osseous abnormality. Small ankle effusion. Soft tissue swelling.   Electronically Signed   By: Lorriane Shire M.D.   On: 10/19/2014 12:02   Dg C-arm 1-60 Min  10/19/2014   CLINICAL DATA:  ORIF proximal tibia fracture.  EXAM: DG C-ARM 61-120 MIN; LEFT KNEE - 1-2 VIEW  FLUOROSCOPY TIME:  C-arm fluoroscopic images were obtained intraoperatively and submitted for post operative interpretation. Please see the performing provider's procedural report for the fluoroscopy time utilized.  COMPARISON:  Left knee radiographs and CT 10/13/2014  FINDINGS: Single frontal and lateral intraoperative spot fluoroscopic images of the left knee are provided. These demonstrate interval reduction and internal fixation of the proximal tibia fracture. Alignment appears grossly anatomic on these limited images. Lateral plate and screws are in place. The knee is located.  IMPRESSION: Intraoperative images during ORIF of left proximal tibia fracture.   Electronically Signed   By: Logan Bores   On: 10/19/2014 13:31    Disposition: 01-Home or Self Care      Discharge Instructions    Call MD / Call 911    Complete by:  As directed   If you experience chest pain or shortness of breath, CALL 911 and be transported to the hospital emergency room.  If you develope a fever above 101.5 F, pus (white drainage) or increased drainage or redness at the  wound, or calf pain, call your surgeon's office.     Constipation Prevention    Complete by:  As directed   Drink plenty of fluids.  Prune juice may be helpful.  You may use a stool softener, such as Colace (over the counter) 100 mg twice a day.  Use MiraLax (over the counter) for constipation as needed.     Diet - low sodium heart healthy    Complete by:  As directed      Diet general    Complete by:  As directed      Driving restrictions    Complete by:  As directed   No driving while taking narcotic pain meds.     Increase activity slowly as tolerated    Complete by:  As directed            Follow-up Information    Follow up with Marianna Payment, MD In 2 weeks.   Specialty:  Orthopedic Surgery   Why:  For suture removal, For wound re-check   Contact information:   [address] [place] [address] [phone]        Signed: Marianna Payment 10/20/2014, 4:00 PM

## 2014-10-23 ENCOUNTER — Encounter (HOSPITAL_COMMUNITY): Payer: Self-pay | Admitting: Orthopaedic Surgery

## 2014-11-06 ENCOUNTER — Encounter (HOSPITAL_COMMUNITY): Payer: Self-pay | Admitting: Orthopaedic Surgery

## 2015-03-03 ENCOUNTER — Ambulatory Visit (INDEPENDENT_AMBULATORY_CARE_PROVIDER_SITE_OTHER): Payer: Medicare Other | Admitting: Emergency Medicine

## 2015-03-03 VITALS — BP 136/84 | HR 71 | Temp 97.8°F | Resp 18 | Ht 64.0 in | Wt 156.0 lb

## 2015-03-03 DIAGNOSIS — R300 Dysuria: Secondary | ICD-10-CM | POA: Diagnosis not present

## 2015-03-03 DIAGNOSIS — O2312 Infections of bladder in pregnancy, second trimester: Secondary | ICD-10-CM

## 2015-03-03 LAB — POCT UA - MICROSCOPIC ONLY
CRYSTALS, UR, HPF, POC: NEGATIVE
Casts, Ur, LPF, POC: NEGATIVE
Mucus, UA: NEGATIVE
Yeast, UA: NEGATIVE

## 2015-03-03 LAB — POCT URINALYSIS DIPSTICK
Bilirubin, UA: NEGATIVE
Glucose, UA: NEGATIVE
NITRITE UA: POSITIVE
Protein, UA: 100
SPEC GRAV UA: 1.015
Urobilinogen, UA: 2
pH, UA: 7

## 2015-03-03 MED ORDER — CIPROFLOXACIN HCL 500 MG PO TABS: 500.0000 mg | ORAL_TABLET | Freq: Two times a day (BID) | ORAL | Status: DC

## 2015-03-03 MED ORDER — PHENAZOPYRIDINE HCL 200 MG PO TABS: 200.0000 mg | ORAL_TABLET | Freq: Three times a day (TID) | ORAL | Status: DC | PRN

## 2015-03-03 NOTE — Patient Instructions (Signed)

## 2015-03-03 NOTE — Progress Notes (Signed)
Subjective:  Patient ID: Rachel White, female    DOB: [birth year]  Age: 64 y.o. MRN: [id number]  CC: Dysuria   HPI Rachel White presents    With a several day history of dysuria  Urgency and frequency. She has some low back discomfort no fever or chills. No vaginal discharge or bleeding. She has no nausea or vomiting. No stool change.  She's been taking AZO with some relief for discomfort. She's had a history of prior multiple urinary tract infection  History Rachel has a past medical history of Hypertension; Neuropathic pain; Diabetes mellitus without complication; Sleep apnea; Pneumonia; Headache; Irritable bowel syndrome; and Complication of anesthesia.   She has past surgical history that includes Gastric bypass; Foot surgery (Left); Dilation and curettage of uterus; Tubal ligation; and ORIF tibia plateau (Left, 10/19/2014).   Her  family history is not on file.  She   reports that she has been smoking Cigarettes.  She has a 41 pack-year smoking history. She has never used smokeless tobacco. She reports that she does not drink alcohol or use illicit drugs.  Outpatient Prescriptions Prior to Visit  Medication Sig Dispense Refill  . aspirin EC 325 MG tablet Take 1 tablet (325 mg total) by mouth 2 (two) times daily. 84 tablet 0  . CALCIUM PO Take 2 tablets by mouth daily.     . diphenoxylate-atropine (LOMOTIL) 2.5-0.025 MG per tablet Take 1 tablet by mouth daily as needed for diarrhea or loose stools. 90 tablet 3  . lisinopril (PRINIVIL,ZESTRIL) 10 MG tablet Take 10 mg by mouth daily.    Marland Kitchen LORazepam (ATIVAN) 1 MG tablet Take 1 tablet (1 mg total) by mouth at bedtime and may repeat dose one time if needed. 180 tablet 1  . MELATONIN PO Take 1 tablet by mouth at bedtime.    . pregabalin (LYRICA) 50 MG capsule Take 50 mg by mouth 3 (three) times daily.    . ciprofloxacin (CIPRO) 250 MG tablet Take 1 tablet (250 mg total) by mouth 2 (two) times daily. 10 tablet 0  . docusate  sodium (COLACE) 100 MG capsule Take 1 capsule (100 mg total) by mouth every 12 (twelve) hours. 15 capsule 0  . gabapentin (NEURONTIN) 300 MG capsule Take 1 capsule (300 mg total) by mouth at bedtime and may repeat dose one time if needed. 90 capsule 3  . oxyCODONE (OXY IR/ROXICODONE) 5 MG immediate release tablet Take 1-3 tablets (5-15 mg total) by mouth every 4 (four) hours as needed. 90 tablet 0  . oxyCODONE-acetaminophen (PERCOCET/ROXICET) 5-325 MG per tablet Take 1-2 tablets by mouth every 6 (six) hours as needed for severe pain. 20 tablet 0  . phenazopyridine (PYRIDIUM) 200 MG tablet Take 1 tablet (200 mg total) by mouth 3 (three) times daily as needed. 6 tablet 0   No facility-administered medications prior to visit.    Social History   Social History  . Marital Status: Married    Spouse Name: N/A  . Number of Children: N/A  . Years of Education: N/A   Social History Main Topics  . Smoking status: Current Every Day Smoker -- 1.00 packs/day for 41 years    Types: Cigarettes  . Smokeless tobacco: Never Used  . Alcohol Use: No  . Drug Use: No  . Sexual Activity: Not Asked   Other Topics Concern  . None   Social History Narrative     Review of Systems  Constitutional: Negative for fever, chills and appetite change.  HENT: Negative for congestion, ear pain, postnasal drip, sinus pressure and sore throat.   Eyes: Negative for pain and redness.  Respiratory: Negative for cough, shortness of breath and wheezing.   Cardiovascular: Negative for leg swelling.  Gastrointestinal: Negative for nausea, vomiting, abdominal pain, diarrhea, constipation and blood in stool.  Endocrine: Negative for polyuria.  Genitourinary: Positive for dysuria, urgency and frequency. Negative for flank pain.  Musculoskeletal: Negative for gait problem.  Skin: Negative for rash.  Neurological: Negative for weakness and headaches.  Psychiatric/Behavioral: Negative for confusion and decreased  concentration. The patient is not nervous/anxious.     Objective:  BP 136/84 mmHg  Pulse 71  Temp(Src) 97.8 F (36.6 C) (Oral)  Resp 18  Ht 5\' 4"  (1.626 m)  Wt 156 lb (70.761 kg)  BMI 26.76 kg/m2  SpO2 98%  Physical Exam  Constitutional: She is oriented to person, place, and time. She appears well-developed and well-nourished.  HENT:  Head: Normocephalic and atraumatic.  Eyes: Conjunctivae are normal. Pupils are equal, round, and reactive to light.  Pulmonary/Chest: Effort normal.  Musculoskeletal: She exhibits no edema.  Neurological: She is alert and oriented to person, place, and time.  Skin: Skin is dry.  Psychiatric: She has a normal mood and affect. Her behavior is normal. Thought content normal.      Assessment & Plan:   Rachel White was seen today for dysuria.  Diagnoses and all orders for this visit:  Acute cystitis during pregnancy in second trimester  Dysuria -     POCT UA - Microscopic Only -     POCT urinalysis dipstick  Other orders -     phenazopyridine (PYRIDIUM) 200 MG tablet; Take 1 tablet (200 mg total) by mouth 3 (three) times daily as needed. -     ciprofloxacin (CIPRO) 500 MG tablet; Take 1 tablet (500 mg total) by mouth 2 (two) times daily.   I have discontinued Rachel White's gabapentin, ciprofloxacin, phenazopyridine, oxyCODONE-acetaminophen, docusate sodium, and oxyCODONE. I am also having her start on phenazopyridine and ciprofloxacin. Additionally, I am having her maintain her CALCIUM PO, diphenoxylate-atropine, LORazepam, pregabalin, lisinopril, MELATONIN PO, and aspirin EC.  Meds ordered this encounter  Medications  . phenazopyridine (PYRIDIUM) 200 MG tablet    Sig: Take 1 tablet (200 mg total) by mouth 3 (three) times daily as needed.    Dispense:  6 tablet    Refill:  0  . ciprofloxacin (CIPRO) 500 MG tablet    Sig: Take 1 tablet (500 mg total) by mouth 2 (two) times daily.    Dispense:  20 tablet    Refill:  0    Appropriate red  flag conditions were discussed with the patient as well as actions that should be taken.  Patient expressed his understanding.  Follow-up: Return if symptoms worsen or fail to improve.  Roselee Culver, MD   Results for orders placed or performed in visit on 03/03/15  POCT UA - Microscopic Only  Result Value Ref Range   WBC, Ur, HPF, POC 25-35    RBC, urine, microscopic 3-6    Bacteria, U Microscopic 1+    Mucus, UA neg    Epithelial cells, urine per micros 2-4    Crystals, Ur, HPF, POC neg    Casts, Ur, LPF, POC neg    Yeast, UA neg   POCT urinalysis dipstick  Result Value Ref Range   Color, UA orange    Clarity, UA cloudy    Glucose, UA neg  Bilirubin, UA neg    Ketones, UA trace    Spec Grav, UA 1.015    Blood, UA trace    pH, UA 7.0    Protein, UA 100    Urobilinogen, UA 2.0    Nitrite, UA pos    Leukocytes, UA large (3+) (A) Negative

## 2015-07-29 DIAGNOSIS — M549 Dorsalgia, unspecified: Secondary | ICD-10-CM

## 2015-07-29 DIAGNOSIS — K589 Irritable bowel syndrome without diarrhea: Secondary | ICD-10-CM | POA: Insufficient documentation

## 2015-07-29 DIAGNOSIS — M8580 Other specified disorders of bone density and structure, unspecified site: Secondary | ICD-10-CM | POA: Insufficient documentation

## 2015-07-29 DIAGNOSIS — G8929 Other chronic pain: Secondary | ICD-10-CM | POA: Insufficient documentation

## 2015-07-29 DIAGNOSIS — E785 Hyperlipidemia, unspecified: Secondary | ICD-10-CM | POA: Insufficient documentation

## 2015-10-26 ENCOUNTER — Inpatient Hospital Stay (HOSPITAL_COMMUNITY)
Admission: EM | Admit: 2015-10-26 | Discharge: 2015-10-30 | DRG: 392 | Disposition: A | Discharge status: Home / Self Care | Payer: Medicare Other | Attending: Internal Medicine | Admitting: Internal Medicine

## 2015-10-26 ENCOUNTER — Encounter (HOSPITAL_COMMUNITY): Payer: Self-pay | Admitting: Emergency Medicine

## 2015-10-26 DIAGNOSIS — N179 Acute kidney failure, unspecified: Secondary | ICD-10-CM | POA: Diagnosis present

## 2015-10-26 DIAGNOSIS — E876 Hypokalemia: Secondary | ICD-10-CM | POA: Diagnosis present

## 2015-10-26 DIAGNOSIS — Z66 Do not resuscitate: Secondary | ICD-10-CM | POA: Diagnosis present

## 2015-10-26 DIAGNOSIS — K648 Other hemorrhoids: Secondary | ICD-10-CM | POA: Diagnosis present

## 2015-10-26 DIAGNOSIS — Z882 Allergy status to sulfonamides status: Secondary | ICD-10-CM

## 2015-10-26 DIAGNOSIS — Z9884 Bariatric surgery status: Secondary | ICD-10-CM

## 2015-10-26 DIAGNOSIS — I10 Essential (primary) hypertension: Secondary | ICD-10-CM | POA: Diagnosis present

## 2015-10-26 DIAGNOSIS — D125 Benign neoplasm of sigmoid colon: Secondary | ICD-10-CM | POA: Diagnosis present

## 2015-10-26 DIAGNOSIS — F17210 Nicotine dependence, cigarettes, uncomplicated: Secondary | ICD-10-CM | POA: Diagnosis present

## 2015-10-26 DIAGNOSIS — G4733 Obstructive sleep apnea (adult) (pediatric): Secondary | ICD-10-CM | POA: Diagnosis present

## 2015-10-26 DIAGNOSIS — K529 Noninfective gastroenteritis and colitis, unspecified: Secondary | ICD-10-CM | POA: Diagnosis not present

## 2015-10-26 DIAGNOSIS — I951 Orthostatic hypotension: Secondary | ICD-10-CM

## 2015-10-26 DIAGNOSIS — E860 Dehydration: Secondary | ICD-10-CM | POA: Diagnosis present

## 2015-10-26 DIAGNOSIS — I959 Hypotension, unspecified: Secondary | ICD-10-CM | POA: Diagnosis not present

## 2015-10-26 DIAGNOSIS — A084 Viral intestinal infection, unspecified: Principal | ICD-10-CM | POA: Diagnosis present

## 2015-10-26 DIAGNOSIS — Z888 Allergy status to other drugs, medicaments and biological substances status: Secondary | ICD-10-CM

## 2015-10-26 DIAGNOSIS — E872 Acidosis: Secondary | ICD-10-CM | POA: Diagnosis present

## 2015-10-26 DIAGNOSIS — A09 Infectious gastroenteritis and colitis, unspecified: Secondary | ICD-10-CM | POA: Diagnosis not present

## 2015-10-26 DIAGNOSIS — G4730 Sleep apnea, unspecified: Secondary | ICD-10-CM | POA: Diagnosis present

## 2015-10-26 DIAGNOSIS — R197 Diarrhea, unspecified: Secondary | ICD-10-CM | POA: Diagnosis present

## 2015-10-26 DIAGNOSIS — E119 Type 2 diabetes mellitus without complications: Secondary | ICD-10-CM

## 2015-10-26 LAB — COMPREHENSIVE METABOLIC PANEL
ALBUMIN: 4.7 g/dL (ref 3.5–5.0)
ALK PHOS: 47 U/L (ref 38–126)
ALT: 15 U/L (ref 14–54)
AST: 17 U/L (ref 15–41)
Anion gap: 15 (ref 5–15)
BILIRUBIN TOTAL: 0.6 mg/dL (ref 0.3–1.2)
BUN: 70 mg/dL — ABNORMAL HIGH (ref 6–20)
CO2: 19 mmol/L — AB (ref 22–32)
CREATININE: 3.1 mg/dL — AB (ref 0.44–1.00)
Calcium: 9.4 mg/dL (ref 8.9–10.3)
Chloride: 107 mmol/L (ref 101–111)
GFR calc Af Amer: 17 mL/min — ABNORMAL LOW (ref >60)
GFR calc non Af Amer: 15 mL/min — ABNORMAL LOW (ref >60)
Glucose, Bld: 129 mg/dL — ABNORMAL HIGH (ref 65–99)
Potassium: 3.3 mmol/L — ABNORMAL LOW (ref 3.5–5.1)
Sodium: 141 mmol/L (ref 135–145)
Total Protein: 7.8 g/dL (ref 6.5–8.1)

## 2015-10-26 LAB — CBC
HCT: 46.1 % — ABNORMAL HIGH (ref 36.0–46.0)
Hemoglobin: 15.5 g/dL — ABNORMAL HIGH (ref 12.0–15.0)
MCH: 29.2 pg (ref 26.0–34.0)
MCHC: 33.6 g/dL (ref 30.0–36.0)
MCV: 87 fL (ref 78.0–100.0)
Platelets: 300 10*3/uL (ref 150–400)
RBC: 5.3 MIL/uL — ABNORMAL HIGH (ref 3.87–5.11)
RDW: 13.6 % (ref 11.5–15.5)
WBC: 8.1 10*3/uL (ref 4.0–10.5)

## 2015-10-26 LAB — C DIFFICILE QUICK SCREEN W PCR REFLEX
C DIFFICLE (CDIFF) ANTIGEN: NEGATIVE
C Diff interpretation: NEGATIVE
C Diff toxin: NEGATIVE

## 2015-10-26 LAB — LACTIC ACID, PLASMA: Lactic Acid, Venous: 1.3 mmol/L (ref 0.5–2.0)

## 2015-10-26 LAB — LIPASE, BLOOD: Lipase: 26 U/L (ref 11–51)

## 2015-10-26 MED ORDER — SODIUM CHLORIDE 0.9 % IV BOLUS (SEPSIS)
1000.0000 mL | Freq: Once | INTRAVENOUS | Status: AC
  Administered 2015-10-26: 1000 mL via INTRAVENOUS

## 2015-10-26 MED ORDER — POTASSIUM CHLORIDE 10 MEQ/100ML IV SOLN
10.0000 meq | INTRAVENOUS | Status: AC
  Administered 2015-10-26 (×2): 10 meq via INTRAVENOUS
  Filled 2015-10-26 (×2): qty 100

## 2015-10-26 NOTE — ED Notes (Signed)
Pt reports feeling better and not as weak but still having frequent episodes of diarrhea. Pt unable to produce urine sample at this time.

## 2015-10-26 NOTE — ED Notes (Addendum)
Pt reports diarrhea for a week unrelieved by imodium; denies n/v or blood in stool; denies recent antibiotic use.

## 2015-10-26 NOTE — ED Notes (Signed)
Hospitalist at bedside to evaluate pt.

## 2015-10-26 NOTE — H&P (Signed)
PCP:  Wonda Cerise, MD now with Rachel White   Referring provider HeatherPA   Chief Complaint:  Diarrhea generalized weakness  HPI: Rachel White is a 65 y.o. female   has a past medical history of Hypertension; Neuropathic pain; Diabetes mellitus without complication ([place]); Sleep apnea; Pneumonia; Headache; Irritable bowel syndrome; and Complication of anesthesia.   Presented with a week of diarrhea she's been trying to use Imodium without improvement. No nausea or vomiting associated with that no blood in stool. She's been feeling generally weak. Her brother had some diarrhea. Associative abdominal pain a few days ago but currently improved haven't had any fevers or chills. No chest pain or shortness of breath. Denies antibiotics use. Feeling light headed when she tries to stand up.   IN ER: Was found to have sodium of 3.3. Creatinine 3.1 up from baseline of 1.1 She was given 2 L of normal saline bolus  Regarding pertinent past history: Patient supposedly had history of diabetes that is diet-controlled and had resolved ever since gastric bi pass surgery 9 years ago. history of sleep apnea but not of the C Pap Had hx of IBS with diarrhea in the [past ut not recently. Hospitalist was called for admission for acute renal failure secondary to dehydration  Review of Systems:    Pertinent positives include: abdominal pain,  diarrhea, change in bowel habits, loss of appetite,  fatigue,   Constitutional:  No weight loss, night sweats, Fevers, chills,weight loss  HEENT:  No headaches, Difficulty swallowing,Tooth/dental problems,Sore throat,  No sneezing, itching, ear ache, nasal congestion, post nasal drip,  Cardio-vascular:  No chest pain, Orthopnea, PND, anasarca, dizziness, palpitations.no Bilateral lower extremity swelling  GI:  No heartburn, indigestion, nausea, vomiting,melena, blood in stool, hematemesis Resp:  no shortness of breath at rest. No dyspnea on exertion, No  excess mucus, no productive cough, No non-productive cough, No coughing up of blood.No change in color of mucus.No wheezing. Skin:  no rash or lesions. No jaundice GU:  no dysuria, change in color of urine, no urgency or frequency. No straining to urinate.  No flank pain.  Musculoskeletal:  No joint pain or no joint swelling. No decreased range of motion. No back pain.  Psych:  No change in mood or affect. No depression or anxiety. No memory loss.  Neuro: no localizing neurological complaints, no tingling, no weakness, no double vision, no gait abnormality, no slurred speech, no confusion  Otherwise ROS are negative except for above, 10 systems were reviewed  Past Medical History: Past Medical History  Diagnosis Date  . Hypertension   . Neuropathic pain   . Diabetes mellitus without complication ([name])     diet controlled  . Sleep apnea     does not use C_pap  . Pneumonia     as a child  . Headache     migraines years ago  . Irritable bowel syndrome     under control  . Complication of anesthesia     some difficulty waking up after tubes tied   Past Surgical History  Procedure Laterality Date  . Gastric bypass    . Foot surgery Left   . Dilation and curettage of uterus    . Tubal ligation    . Orif tibia plateau Left 10/19/2014    Procedure: OPEN REDUCTION INTERNAL FIXATION (ORIF) LEFT BICONDYLAR TIBIAL PLATEAU;  Surgeon: Leandrew Koyanagi, MD;  Location: [place];  Service: Orthopedics;  Laterality: Left;     Medications: Prior to  Admission medications   Medication Sig Start Date End Date Taking? Authorizing Provider  amLODipine (NORVASC) 5 MG tablet Take 5 mg by mouth daily.   Yes Historical Provider, MD  aspirin EC 81 MG tablet Take 81 mg by mouth daily.   Yes Historical Provider, MD  atorvastatin (LIPITOR) 40 MG tablet Take 40 mg by mouth daily.   Yes Historical Provider, MD  Calcium Carbonate-Vitamin D (CALCIUM-VITAMIN D) 600-125 MG-UNIT TABS Take 2 tablets by mouth daily.    Yes Historical Provider, MD  lisinopril (PRINIVIL,ZESTRIL) 10 MG tablet Take 10 mg by mouth daily.   Yes Historical Provider, MD  loperamide (IMODIUM) 1 MG/5ML solution Take 4 mg by mouth as needed for diarrhea or loose stools.   Yes Historical Provider, MD  LORazepam (ATIVAN) 1 MG tablet Take 1 tablet (1 mg total) by mouth at bedtime and may repeat dose one time if needed. 08/01/12  Yes Robyn Haber, MD  Melatonin 10 MG CAPS Take 20 mg by mouth at bedtime as needed (SLEEP).   Yes Historical Provider, MD  meloxicam (MOBIC) 15 MG tablet Take 15 mg by mouth daily.   Yes Historical Provider, MD  pregabalin (LYRICA) 150 MG capsule Take 150 mg by mouth 2 (two) times daily.   Yes Historical Provider, MD  tiZANidine (ZANAFLEX) 2 MG tablet Take 4 mg by mouth at bedtime.   Yes Historical Provider, MD  aspirin EC 325 MG tablet Take 1 tablet (325 mg total) by mouth 2 (two) times daily. Patient not taking: Reported on 10/26/2015 10/19/14   Leandrew Koyanagi, MD  diphenoxylate-atropine (LOMOTIL) 2.5-0.025 MG per tablet Take 1 tablet by mouth daily as needed for diarrhea or loose stools. Patient not taking: Reported on 10/26/2015 08/01/12   Robyn Haber, MD    Allergies:   Allergies  Allergen Reactions  . Sulfa Antibiotics Other (See Comments)    Pt. Does not recall the reaction   . Trazodone Other (See Comments)    Visual Disturbances as well as Tingling sensation  . Zolpidem Tartrate Other (See Comments)    Altered mental status     Social History:  Ambulatory   Independently  Lives at home   With family     reports that she has been smoking Cigarettes.  She has a 41 pack-year smoking history. She has never used smokeless tobacco. She reports that she does not drink alcohol or use illicit drugs.     Family History: family history includes COPD in her other; Cancer in her mother and other. There is no history of Diabetes type II.    Physical Exam: Patient Vitals for the past 24 hrs:  BP Temp  Temp src Pulse Resp SpO2 Height Weight  10/26/15 2109 96/72 mmHg - - 88 16 99 % - -  10/26/15 1954 92/58 mmHg - - 86 17 98 % - -  10/26/15 1745 109/74 mmHg 97.8 F (36.6 C) Oral 98 16 98 % 5\' 3"  (1.6 m) 65.318 kg (144 lb)    1. General:  in No Acute distress 2. Psychological: Alert and  Oriented 3. Head/ENT:     Dry Mucous Membranes                          Head Non traumatic, neck supple                          Normal  Dentition 4. SKIN:  decreased  Skin turgor,  Skin clean Dry and intact no rash 5. Heart: Regular rate and rhythm no  Murmur, Rub or gallop 6. Lungs:  Clear to auscultation bilaterally, no wheezes or crackles   7. Abdomen: Soft, non-tender, Non distended 8. Lower extremities: no clubbing, cyanosis, or edema 9. Neurologically Grossly intact, moving all 4 extremities equally 10. MSK: Normal range of motion  body mass index is 25.51 kg/(m^2).   Labs on Admission:   Results for orders placed or performed during the hospital encounter of 10/26/15 (from the past 24 hour(s))  Lipase, blood     Status: None   Collection Time: 10/26/15  7:00 PM  Result Value Ref Range   Lipase 26 11 - 51 U/L  Comprehensive metabolic panel     Status: Abnormal   Collection Time: 10/26/15  7:00 PM  Result Value Ref Range   Sodium 141 135 - 145 mmol/L   Potassium 3.3 (L) 3.5 - 5.1 mmol/L   Chloride 107 101 - 111 mmol/L   CO2 19 (L) 22 - 32 mmol/L   Glucose, Bld 129 (H) 65 - 99 mg/dL   BUN 70 (H) 6 - 20 mg/dL   Creatinine, Ser 3.10 (H) 0.44 - 1.00 mg/dL   Calcium 9.4 8.9 - 10.3 mg/dL   Total Protein 7.8 6.5 - 8.1 g/dL   Albumin 4.7 3.5 - 5.0 g/dL   AST 17 15 - 41 U/L   ALT 15 14 - 54 U/L   Alkaline Phosphatase 47 38 - 126 U/L   Total Bilirubin 0.6 0.3 - 1.2 mg/dL   GFR calc non Af Amer 15 (L) >60 mL/min   GFR calc Af Amer 17 (L) >60 mL/min   Anion gap 15 5 - 15  CBC     Status: Abnormal   Collection Time: 10/26/15  7:00 PM  Result Value Ref Range   WBC 8.1 4.0 - 10.5 K/uL   RBC  5.30 (H) 3.87 - 5.11 MIL/uL   Hemoglobin 15.5 (H) 12.0 - 15.0 g/dL   HCT 46.1 (H) 36.0 - 46.0 %   MCV 87.0 78.0 - 100.0 fL   MCH 29.2 26.0 - 34.0 pg   MCHC 33.6 30.0 - 36.0 g/dL   RDW 13.6 11.5 - 15.5 %   Platelets 300 150 - 400 K/uL    UA not obtained  No results found for: HGBA1C  Estimated Creatinine Clearance: 16.5 mL/min (by C-G formula based on Cr of 3.1).  BNP (last 3 results) No results for input(s): PROBNP in the last 8760 hours.  Other results:  I have pearsonaly reviewed this: ECG REPORT not obtained    Filed Weights   10/26/15 1745  Weight: 65.318 kg (144 lb)     Cultures: No results found for: SDES, SPECREQUEST, CULT, REPTSTATUS   Radiological Exams on Admission: No results found.  Chart has been reviewed  Family   at  Bedside  plan of care was discussed with  Husband Yelena Penfold [id number]  Assessment/Plan  65 year old female history of diabetes hypertension presents with dehydration secondary to diarrhea admit for rehydration follow renal function  Present on Admission:  .  diarrhea check stool PCR and check for C. difficile contact precautions  . Dehydration  administer IV fluids and rehydrate  . Acute renal failure (ARF) ([name]) most likely secondary to dehydration obtain urine electrolytes, if slow to recover overall need renal consult  . Hypotension given soft blood pressures in emergency department whole blood pressure medications  . Hypokalemia will  replace and check magnesium level and to dissipate father electrolyte disbalance is secondary to fluid resuscitation monitor on telemetry next 24 hours  . Sleep apnea - reports not being on C Pap currently stable   Prophylaxis:   Lovenox   CODE STATUS:    DNR/DNI as per patient    Disposition:  To home once workup is complete and patient is stable  Other plan as per orders.  I have spent a total of 56 min on this admission    Cameo Shewell 10/26/2015, 10:39 PM    [facility]  Pager [phone]   after 2 AM please page floor coverage PA If 7AM-7PM, please contact the day team taking care of the patient  [email or web address]  Password [email or web address]

## 2015-10-26 NOTE — ED Notes (Signed)
Told pt that a urine sample is needed, pt says that she only is having diarrhea

## 2015-10-26 NOTE — ED Provider Notes (Signed)
CSN: [id number]     Arrival date & time 10/26/15  1739 History   First MD Initiated Contact with Patient 10/26/15 1914     Chief Complaint  Patient presents with  . Diarrhea     (Consider location/radiation/quality/duration/timing/severity/associated sxs/prior Treatment) HPI Comments: Patient presents today with diarrhea for the past week.  She reports numerous episodes of diarrhea.  She states that she has been taking Imodium for the diarrhea, which she states has slowed down to one episode every two hours.  She denies any blood in her stool.  She reports that she had an episode of diffuse abdominal cramping pain a couple of days ago, but denies any pain at this time.  She denies fever, chills, nausea, vomiting, or any other symptoms at this time.  She denies any recent hospitalizations or antibiotic use.  Denies recent foreign travel.  No history of C Diff.  Patient is a 65 y.o. female presenting with diarrhea. The history is provided by the patient.  Diarrhea   Past Medical History  Diagnosis Date  . Hypertension   . Neuropathic pain   . Diabetes mellitus without complication ([name])     diet controlled  . Sleep apnea     does not use C_pap  . Pneumonia     as a child  . Headache     migraines years ago  . Irritable bowel syndrome     under control  . Complication of anesthesia     some difficulty waking up after tubes tied   Past Surgical History  Procedure Laterality Date  . Gastric bypass    . Foot surgery Left   . Dilation and curettage of uterus    . Tubal ligation    . Orif tibia plateau Left 10/19/2014    Procedure: OPEN REDUCTION INTERNAL FIXATION (ORIF) LEFT BICONDYLAR TIBIAL PLATEAU;  Surgeon: Leandrew Koyanagi, MD;  Location: [place];  Service: Orthopedics;  Laterality: Left;   No family history on file. Social History  Substance Use Topics  . Smoking status: Current Every Day Smoker -- 1.00 packs/day for 41 years    Types: Cigarettes  . Smokeless tobacco: Never Used   . Alcohol Use: No   OB History    No data available     Review of Systems  Gastrointestinal: Positive for diarrhea.  All other systems reviewed and are negative.     Allergies  Sulfa antibiotics; Trazodone; and Zolpidem tartrate  Home Medications   Prior to Admission medications   Medication Sig Start Date End Date Taking? Authorizing Provider  amLODipine (NORVASC) 5 MG tablet Take 5 mg by mouth daily.   Yes Historical Provider, MD  aspirin EC 81 MG tablet Take 81 mg by mouth daily.   Yes Historical Provider, MD  atorvastatin (LIPITOR) 40 MG tablet Take 40 mg by mouth daily.   Yes Historical Provider, MD  Calcium Carbonate-Vitamin D (CALCIUM-VITAMIN D) 600-125 MG-UNIT TABS Take 2 tablets by mouth daily.   Yes Historical Provider, MD  lisinopril (PRINIVIL,ZESTRIL) 10 MG tablet Take 10 mg by mouth daily.   Yes Historical Provider, MD  loperamide (IMODIUM) 1 MG/5ML solution Take 4 mg by mouth as needed for diarrhea or loose stools.   Yes Historical Provider, MD  LORazepam (ATIVAN) 1 MG tablet Take 1 tablet (1 mg total) by mouth at bedtime and may repeat dose one time if needed. 08/01/12  Yes Robyn Haber, MD  Melatonin 10 MG CAPS Take 20 mg by mouth at bedtime as  needed (SLEEP).   Yes Historical Provider, MD  meloxicam (MOBIC) 15 MG tablet Take 15 mg by mouth daily.   Yes Historical Provider, MD  pregabalin (LYRICA) 150 MG capsule Take 150 mg by mouth 2 (two) times daily.   Yes Historical Provider, MD  tiZANidine (ZANAFLEX) 2 MG tablet Take 4 mg by mouth at bedtime.   Yes Historical Provider, MD  aspirin EC 325 MG tablet Take 1 tablet (325 mg total) by mouth 2 (two) times daily. Patient not taking: Reported on 10/26/2015 10/19/14   Leandrew Koyanagi, MD  diphenoxylate-atropine (LOMOTIL) 2.5-0.025 MG per tablet Take 1 tablet by mouth daily as needed for diarrhea or loose stools. Patient not taking: Reported on 10/26/2015 08/01/12   Robyn Haber, MD   BP 109/74 mmHg  Pulse 98  Temp(Src)  97.8 F (36.6 C) (Oral)  Resp 16  Ht 5\' 3"  (1.6 m)  Wt 65.318 kg  BMI 25.51 kg/m2  SpO2 98% Physical Exam  Constitutional: She appears well-developed and well-nourished.  HENT:  Head: Normocephalic and atraumatic.  Mouth/Throat: Mucous membranes are dry.  Neck: Normal range of motion. Neck supple.  Cardiovascular: Normal rate, regular rhythm and normal heart sounds.   Pulmonary/Chest: Effort normal and breath sounds normal.  Abdominal: Soft. Bowel sounds are normal. She exhibits no distension and no mass. There is no tenderness. There is no rebound and no guarding.  Musculoskeletal: Normal range of motion.  Neurological: She is alert.  Skin: Skin is warm and dry.  Psychiatric: She has a normal mood and affect.  Nursing note and vitals reviewed.   ED Course  Procedures (including critical care time) Labs Review Labs Reviewed  COMPREHENSIVE METABOLIC PANEL - Abnormal; Notable for the following:    Potassium 3.3 (*)    CO2 19 (*)    Glucose, Bld 129 (*)    BUN 70 (*)    Creatinine, Ser 3.10 (*)    GFR calc non Af Amer 15 (*)    GFR calc Af Amer 17 (*)    All other components within normal limits  CBC - Abnormal; Notable for the following:    RBC 5.30 (*)    Hemoglobin 15.5 (*)    HCT 46.1 (*)    All other components within normal limits  C DIFFICILE QUICK SCREEN W PCR REFLEX  GASTROINTESTINAL PANEL BY PCR, STOOL (REPLACES STOOL CULTURE)  LIPASE, BLOOD  LACTIC ACID, PLASMA  URINALYSIS, ROUTINE W REFLEX MICROSCOPIC (NOT AT [facility])  CREATININE, URINE, RANDOM  SODIUM, URINE, RANDOM    Imaging Review No results found. I have personally reviewed and evaluated these images and lab results as part of my medical decision-making.   EKG Interpretation None      MDM   Final diagnoses:  None   Patient presents today with diarrhea x 1 week.  No vomiting.  Abdomen is soft and non tender on exam.  She denies any recent antibiotic use or hospitalizations.  GI stool panel  pending.  C Diff also pending.  Labs today show ARF.  Creatine is 3.1, which is up from her baseline of around 1.0.  She also has a BUN of 70.  Suspect this is secondary to dehydration.  Patient given 2 L IVF in the ED.  Patient admitted to [facility] for ARF and dehydration.    Hyman Bible, PA-C 10/26/15 [id number]  Charlesetta Shanks, MD 10/31/15 [id number]

## 2015-10-27 ENCOUNTER — Encounter (HOSPITAL_COMMUNITY): Payer: Self-pay | Admitting: *Deleted

## 2015-10-27 DIAGNOSIS — I959 Hypotension, unspecified: Secondary | ICD-10-CM

## 2015-10-27 LAB — GASTROINTESTINAL PANEL BY PCR, STOOL (REPLACES STOOL CULTURE)
ASTROVIRUS: NOT DETECTED
Adenovirus F40/41: NOT DETECTED
CAMPYLOBACTER SPECIES: NOT DETECTED
Cryptosporidium: NOT DETECTED
Cyclospora cayetanensis: NOT DETECTED
E. coli O157: NOT DETECTED
ENTEROAGGREGATIVE E COLI (EAEC): NOT DETECTED
ENTEROPATHOGENIC E COLI (EPEC): NOT DETECTED
Entamoeba histolytica: NOT DETECTED
Enterotoxigenic E coli (ETEC): NOT DETECTED
Giardia lamblia: NOT DETECTED
NOROVIRUS GI/GII: NOT DETECTED
Plesimonas shigelloides: NOT DETECTED
Rotavirus A: NOT DETECTED
Salmonella species: NOT DETECTED
Sapovirus (I, II, IV, and V): NOT DETECTED
Shiga like toxin producing E coli (STEC): NOT DETECTED
Shigella/Enteroinvasive E coli (EIEC): NOT DETECTED
Vibrio cholerae: NOT DETECTED
Vibrio species: NOT DETECTED
Yersinia enterocolitica: NOT DETECTED

## 2015-10-27 LAB — URINE MICROSCOPIC-ADD ON

## 2015-10-27 LAB — CBC
HCT: 36.8 % (ref 36.0–46.0)
Hemoglobin: 12.6 g/dL (ref 12.0–15.0)
MCH: 29.9 pg (ref 26.0–34.0)
MCHC: 34.2 g/dL (ref 30.0–36.0)
MCV: 87.2 fL (ref 78.0–100.0)
PLATELETS: 216 10*3/uL (ref 150–400)
RBC: 4.22 MIL/uL (ref 3.87–5.11)
RDW: 13.8 % (ref 11.5–15.5)
WBC: 5.9 10*3/uL (ref 4.0–10.5)

## 2015-10-27 LAB — COMPREHENSIVE METABOLIC PANEL
ALK PHOS: 35 U/L — AB (ref 38–126)
ALT: 13 U/L — AB (ref 14–54)
AST: 14 U/L — ABNORMAL LOW (ref 15–41)
Albumin: 3.5 g/dL (ref 3.5–5.0)
Anion gap: 8 (ref 5–15)
BUN: 63 mg/dL — ABNORMAL HIGH (ref 6–20)
CALCIUM: 7.9 mg/dL — AB (ref 8.9–10.3)
CHLORIDE: 116 mmol/L — AB (ref 101–111)
CO2: 18 mmol/L — ABNORMAL LOW (ref 22–32)
CREATININE: 2.18 mg/dL — AB (ref 0.44–1.00)
GFR, EST AFRICAN AMERICAN: 26 mL/min — AB (ref >60)
GFR, EST NON AFRICAN AMERICAN: 23 mL/min — AB (ref >60)
Glucose, Bld: 111 mg/dL — ABNORMAL HIGH (ref 65–99)
Potassium: 3.5 mmol/L (ref 3.5–5.1)
Sodium: 142 mmol/L (ref 135–145)
TOTAL PROTEIN: 6.1 g/dL — AB (ref 6.5–8.1)
Total Bilirubin: 0.3 mg/dL (ref 0.3–1.2)

## 2015-10-27 LAB — URINALYSIS, ROUTINE W REFLEX MICROSCOPIC
Glucose, UA: NEGATIVE mg/dL
Ketones, ur: NEGATIVE mg/dL
Nitrite: NEGATIVE
PROTEIN: 30 mg/dL — AB
Specific Gravity, Urine: 1.022 (ref 1.005–1.030)
pH: 5.5 (ref 5.0–8.0)

## 2015-10-27 LAB — TSH: TSH: 0.337 u[IU]/mL — AB (ref 0.350–4.500)

## 2015-10-27 LAB — GLUCOSE, CAPILLARY: GLUCOSE-CAPILLARY: 83 mg/dL (ref 65–99)

## 2015-10-27 LAB — MAGNESIUM: Magnesium: 1.9 mg/dL (ref 1.7–2.4)

## 2015-10-27 LAB — PHOSPHORUS: Phosphorus: 4.1 mg/dL (ref 2.5–4.6)

## 2015-10-27 LAB — SODIUM, URINE, RANDOM: SODIUM UR: 24 mmol/L

## 2015-10-27 LAB — CREATININE, URINE, RANDOM: CREATININE, URINE: 208.13 mg/dL

## 2015-10-27 MED ORDER — MELATONIN 10 MG PO CAPS: 20.0000 mg | ORAL_CAPSULE | Freq: Every evening | ORAL | Status: DC | PRN

## 2015-10-27 MED ORDER — ONDANSETRON HCL 4 MG/2ML IJ SOLN: 4.0000 mg | Freq: Four times a day (QID) | INTRAMUSCULAR | Status: DC | PRN

## 2015-10-27 MED ORDER — SODIUM CHLORIDE 0.9 % IV SOLN
INTRAVENOUS | Status: DC
  Administered 2015-10-27: 01:00:00 via INTRAVENOUS

## 2015-10-27 MED ORDER — ACETAMINOPHEN 650 MG RE SUPP: 650.0000 mg | Freq: Four times a day (QID) | RECTAL | Status: DC | PRN

## 2015-10-27 MED ORDER — TIZANIDINE HCL 4 MG PO TABS
4.0000 mg | ORAL_TABLET | Freq: Every day | ORAL | Status: DC
  Administered 2015-10-27 – 2015-10-29 (×4): 4 mg via ORAL
  Filled 2015-10-27 (×5): qty 1

## 2015-10-27 MED ORDER — ATORVASTATIN CALCIUM 40 MG PO TABS
40.0000 mg | ORAL_TABLET | Freq: Every day | ORAL | Status: DC
  Administered 2015-10-27 – 2015-10-30 (×4): 40 mg via ORAL
  Filled 2015-10-27 (×4): qty 1

## 2015-10-27 MED ORDER — SODIUM CHLORIDE 0.9% FLUSH
3.0000 mL | Freq: Two times a day (BID) | INTRAVENOUS | Status: DC
  Administered 2015-10-29 – 2015-10-30 (×2): 3 mL via INTRAVENOUS

## 2015-10-27 MED ORDER — PREGABALIN 75 MG PO CAPS
150.0000 mg | ORAL_CAPSULE | Freq: Two times a day (BID) | ORAL | Status: DC
  Administered 2015-10-27 – 2015-10-30 (×6): 150 mg via ORAL
  Filled 2015-10-27 (×7): qty 2

## 2015-10-27 MED ORDER — LORAZEPAM 1 MG PO TABS: 1.0000 mg | ORAL_TABLET | Freq: Every evening | ORAL | Status: DC | PRN

## 2015-10-27 MED ORDER — POTASSIUM CHLORIDE IN NACL 40-0.9 MEQ/L-% IV SOLN
INTRAVENOUS | Status: DC
  Administered 2015-10-27 – 2015-10-28 (×2): 125 mL/h via INTRAVENOUS
  Filled 2015-10-27 (×3): qty 1000

## 2015-10-27 MED ORDER — ASPIRIN EC 81 MG PO TBEC
81.0000 mg | DELAYED_RELEASE_TABLET | Freq: Every day | ORAL | Status: DC
  Administered 2015-10-27 – 2015-10-30 (×4): 81 mg via ORAL
  Filled 2015-10-27 (×4): qty 1

## 2015-10-27 MED ORDER — LORAZEPAM 1 MG PO TABS
1.0000 mg | ORAL_TABLET | Freq: Every evening | ORAL | Status: DC | PRN
  Administered 2015-10-27 – 2015-10-29 (×5): 1 mg via ORAL
  Filled 2015-10-27 (×5): qty 1

## 2015-10-27 MED ORDER — ENOXAPARIN SODIUM 30 MG/0.3ML ~~LOC~~ SOLN
30.0000 mg | SUBCUTANEOUS | Status: DC
  Administered 2015-10-27 – 2015-10-28 (×2): 30 mg via SUBCUTANEOUS
  Filled 2015-10-27 (×2): qty 0.3

## 2015-10-27 MED ORDER — HYDROCODONE-ACETAMINOPHEN 5-325 MG PO TABS
1.0000 | ORAL_TABLET | ORAL | Status: DC | PRN
  Administered 2015-10-28: 2 via ORAL
  Filled 2015-10-27: qty 2

## 2015-10-27 MED ORDER — ONDANSETRON HCL 4 MG PO TABS: 4.0000 mg | ORAL_TABLET | Freq: Four times a day (QID) | ORAL | Status: DC | PRN

## 2015-10-27 MED ORDER — ACETAMINOPHEN 325 MG PO TABS: 650.0000 mg | ORAL_TABLET | Freq: Four times a day (QID) | ORAL | Status: DC | PRN

## 2015-10-27 NOTE — Progress Notes (Signed)
PHARMACIST - PHYSICIAN ORDER COMMUNICATION  CONCERNING: P&T Medication Policy on Herbal Medications  DESCRIPTION:  This patient's order for:  Melatonin  has been noted.  This product(s) is classified as an "herbal" or natural product. Due to a lack of definitive safety studies or FDA approval, nonstandard manufacturing practices, plus the potential risk of unknown drug-drug interactions while on inpatient medications, the Pharmacy and Therapeutics Committee does not permit the use of "herbal" or natural products of this type within [facility].   ACTION TAKEN: The pharmacy department is unable to verify this order at this time and your patient has been informed of this safety policy. Please reevaluate patient's clinical condition at discharge and address if the herbal or natural product(s) should be resumed at that time.  Andree Golphin, PharmD  

## 2015-10-27 NOTE — Progress Notes (Addendum)
PROGRESS NOTE    Rachel White  [id number]  DOB: 15-Apr-1951  DOA: 10/26/2015 PCP: Wonda Cerise, MD Outpatient Specialists:   Hospital course: 65 year old female patient with history of HTN, diet-controlled DM, OSA not on CPAP, IBS-diarrhea type, presented to the ED on 10/26/15 with a week's history of watery, nonbloody diarrhea which did not improve with Imodium. Associated with generalized weakness and some lightheadedness on admission. History of diarrhea in patient's brother and son's 2 coworkers. In the ED, potassium 3.3, creatinine 3.1. Admitted for management. C. difficile PCR negative.   Assessment & Plan:   Diarrhea - Presumed Viral (no fever, leukocytosis, abdominal pain or blood in stools) - C. difficile PCR negative. GI pathogen panel PCR pending. - Treat supportively. Hold antimotility agents for now. If GI pathogen panel PCR negative, may consider Imodium and if does not improve, may need GI consultation. - Mild non-anion gap metabolic acidosis secondary to diarrhea.  Dehydration - Secondary to GI losses. IV fluids. Improving  Acute kidney injury - Secondary to dehydration from GI losses. Improving. IV fluids  Hypokalemia - Replace and follow. Magnesium is normal.  Hypotension - IV fluids and follow.  Diet-controlled DM - Monitor CBGs  OSA - Not on CPAP    DVT prophylaxis: Lovenox Code Status: DO NOT RESUSCITATE Family Communication: None at bedside Disposition Plan: DC home when medically stable   Consultants:  None  Procedures:  None  Antimicrobials:  None   Subjective: Feels stronger. No dizziness or lightheadedness reported. Tolerating diet and asking to advanced. Persisting diarrhea-frequent watery green without blood.  Objective: Filed Vitals:   10/27/15 0036 10/27/15 0103 10/27/15 0510 10/27/15 1312  BP: 92/59 98/51 101/58 112/61  Pulse: 67 68 63 59  Temp:  98 F (36.7 C) 97.8 F (36.6 C) 98.2 F (36.8 C)    TempSrc:  Oral Oral Oral  Resp: 18  16 18   Height:  5\' 3"  (1.6 m)    Weight:  67.5 kg (148 lb 13 oz)    SpO2: 98% 99% 100% 100%    Intake/Output Summary (Last 24 hours) at 10/27/15 1530 Last data filed at 10/27/15 0600  Gross per 24 hour  Intake 835.83 ml  Output      0 ml  Net 835.83 ml   Filed Weights   10/26/15 1745 10/27/15 0103  Weight: 65.318 kg (144 lb) 67.5 kg (148 lb 13 oz)    Exam:  General exam: Pleasant middle-aged female sitting comfortably in bed. Does not appear septic or toxic. Respiratory system: Clear. No increased work of breathing. Cardiovascular system: S1 & S2 heard, RRR. No JVD, murmurs, gallops, clicks or pedal edema. Telemetry: SR. Gastrointestinal system: Abdomen is nondistended, soft and nontender. Normal bowel sounds heard. Central nervous system: Alert and oriented. No focal neurological deficits. Extremities: Symmetric 5 x 5 power.   Data Reviewed: Basic Metabolic Panel:  Recent Labs Lab 10/26/15 1900 10/27/15 0116  NA 141 142  K 3.3* 3.5  CL 107 116*  CO2 19* 18*  GLUCOSE 129* 111*  BUN 70* 63*  CREATININE 3.10* 2.18*  CALCIUM 9.4 7.9*  MG  --  1.9  PHOS  --  4.1   Liver Function Tests:  Recent Labs Lab 10/26/15 1900 10/27/15 0116  AST 17 14*  ALT 15 13*  ALKPHOS 47 35*  BILITOT 0.6 0.3  PROT 7.8 6.1*  ALBUMIN 4.7 3.5    Recent Labs Lab 10/26/15 1900  LIPASE 26   No results for input(s): AMMONIA  in the last 168 hours. CBC:  Recent Labs Lab 10/26/15 1900 10/27/15 0116  WBC 8.1 5.9  HGB 15.5* 12.6  HCT 46.1* 36.8  MCV 87.0 87.2  PLT 300 216   Cardiac Enzymes: No results for input(s): CKTOTAL, CKMB, CKMBINDEX, TROPONINI in the last 168 hours. BNP (last 3 results) No results for input(s): PROBNP in the last 8760 hours. CBG: No results for input(s): GLUCAP in the last 168 hours.  Recent Results (from the past 240 hour(s))  C difficile quick scan w PCR reflex     Status: None   Collection Time: 10/26/15   9:32 PM  Result Value Ref Range Status   C Diff antigen NEGATIVE NEGATIVE Final   C Diff toxin NEGATIVE NEGATIVE Final   C Diff interpretation Negative for toxigenic C. difficile  Final         Studies: No results found.      Scheduled Meds: . aspirin EC  81 mg Oral Daily  . atorvastatin  40 mg Oral Daily  . enoxaparin (LOVENOX) injection  30 mg Subcutaneous Q24H  . LORazepam  1 mg Oral QHS,MR X 1  . pregabalin  150 mg Oral BID  . sodium chloride flush  3 mL Intravenous Q12H  . tiZANidine  4 mg Oral QHS   Continuous Infusions:    Active Problems:   Chronic diarrhea   Dehydration   Acute renal failure (ARF) ([name])   Hypotension   Hypokalemia   Diabetes mellitus ([name])   Sleep apnea   Diarrhea    Time spent: 25 minutes.    Vernell Leep, MD, FACP, FHM. [facility] Pager [phone]  If 7PM-7AM, please contact night-coverage [email or web address] Password [email or web address] 10/27/2015, 3:30 PM    LOS: 1 day

## 2015-10-27 NOTE — Progress Notes (Signed)
Utilization review completed.  

## 2015-10-28 ENCOUNTER — Encounter (HOSPITAL_COMMUNITY): Payer: Self-pay | Admitting: Gastroenterology

## 2015-10-28 DIAGNOSIS — A09 Infectious gastroenteritis and colitis, unspecified: Secondary | ICD-10-CM

## 2015-10-28 LAB — BASIC METABOLIC PANEL
ANION GAP: 6 (ref 5–15)
BUN: 36 mg/dL — ABNORMAL HIGH (ref 6–20)
CHLORIDE: 118 mmol/L — AB (ref 101–111)
CO2: 20 mmol/L — ABNORMAL LOW (ref 22–32)
CREATININE: 1.01 mg/dL — AB (ref 0.44–1.00)
Calcium: 8.5 mg/dL — ABNORMAL LOW (ref 8.9–10.3)
GFR calc non Af Amer: 57 mL/min — ABNORMAL LOW (ref >60)
Glucose, Bld: 87 mg/dL (ref 65–99)
Potassium: 3.5 mmol/L (ref 3.5–5.1)
SODIUM: 144 mmol/L (ref 135–145)

## 2015-10-28 LAB — GLUCOSE, CAPILLARY
GLUCOSE-CAPILLARY: 77 mg/dL (ref 65–99)
Glucose-Capillary: 68 mg/dL (ref 65–99)

## 2015-10-28 LAB — HEMOGLOBIN A1C
Hgb A1c MFr Bld: 5.7 % — ABNORMAL HIGH (ref 4.8–5.6)
Mean Plasma Glucose: 117 mg/dL

## 2015-10-28 MED ORDER — LOPERAMIDE HCL 2 MG PO CAPS
2.0000 mg | ORAL_CAPSULE | Freq: Three times a day (TID) | ORAL | Status: DC | PRN
  Administered 2015-10-28 – 2015-10-29 (×3): 2 mg via ORAL
  Filled 2015-10-28 (×3): qty 1

## 2015-10-28 MED ORDER — ENOXAPARIN SODIUM 40 MG/0.4ML ~~LOC~~ SOLN
40.0000 mg | SUBCUTANEOUS | Status: DC
  Administered 2015-10-29 – 2015-10-30 (×2): 40 mg via SUBCUTANEOUS
  Filled 2015-10-28 (×3): qty 0.4

## 2015-10-28 MED ORDER — POTASSIUM CHLORIDE IN NACL 40-0.9 MEQ/L-% IV SOLN
INTRAVENOUS | Status: DC
  Administered 2015-10-28 – 2015-10-29 (×2): 75 mL/h via INTRAVENOUS
  Filled 2015-10-28 (×2): qty 1000

## 2015-10-28 MED ORDER — POTASSIUM CHLORIDE CRYS ER 20 MEQ PO TBCR
40.0000 meq | EXTENDED_RELEASE_TABLET | Freq: Once | ORAL | Status: AC
  Administered 2015-10-28: 40 meq via ORAL
  Filled 2015-10-28: qty 2

## 2015-10-28 NOTE — Progress Notes (Signed)
PROGRESS NOTE    Rachel White  [id number]  DOB: October 26, 1950  DOA: 10/26/2015 PCP: Wonda Cerise, MD Outpatient Specialists:   Hospital course: 65 year old female patient with history of HTN, diet-controlled DM, OSA not on CPAP, IBS-diarrhea type, presented to the ED on 10/26/15 with a week's history of watery, nonbloody diarrhea which did not improve with Imodium. Associated with generalized weakness and some lightheadedness on admission. History of diarrhea in patient's brother and son's 2 coworkers. In the ED, potassium 3.3, creatinine 3.1. Admitted for management. C. difficile PCR and GI pathogen panel PCR negative. Some improvement.   Assessment & Plan:   Diarrhea - Presumed Viral (no fever, leukocytosis, abdominal pain or blood in stools) - C. difficile PCR negative. GI pathogen panel PCR negative. - Treat supportively.  - Mild non-anion gap metabolic acidosis secondary to diarrhea >improved. - Improving. Add Imodium when necessary. Patient reported "dark stools"-requested nursing to witness next BM. - Patient states that her diarrhea is not similar to her IBS in the past. If has persisting or worsening diarrhea, consider GI consultation [facility]/Dr. Teena Irani). Patient refuses colonoscopy.  Dehydration - Secondary to GI losses. IV fluids. Improved. Continue gentle IV fluids for additional 24 hours.  Acute kidney injury - Secondary to dehydration from GI losses. Resolved.  Hypokalemia - Replace and follow. Magnesium is normal.  Hypotension - Resolved after IV fluids.  Diet-controlled DM - Monitor CBGs. Well controlled. DC CBGs.   OSA - Not on CPAP    DVT prophylaxis: Lovenox Code Status: DO NOT RESUSCITATE Family Communication: None at bedside Disposition Plan: DC home when medically stable   Consultants:  None  Procedures:  None  Antimicrobials:  None   Subjective: States diarrhea is improving-less frequent, less volume and more  formed. As per RN, patient reported last BM is being dark but did not get to witness.  Objective: Filed Vitals:   10/27/15 0510 10/27/15 1312 10/27/15 2153 10/28/15 0734  BP: 101/58 112/61 101/59 120/74  Pulse: 63 59 61 57  Temp: 97.8 F (36.6 C) 98.2 F (36.8 C) 97.4 F (36.3 C) 98.2 F (36.8 C)  TempSrc: Oral Oral Oral Oral  Resp: 16 18 18 18   Height:      Weight:      SpO2: 100% 100% 100% 98%    Intake/Output Summary (Last 24 hours) at 10/28/15 1216 Last data filed at 10/28/15 0600  Gross per 24 hour  Intake 1759.58 ml  Output      0 ml  Net 1759.58 ml   Filed Weights   10/26/15 1745 10/27/15 0103  Weight: 65.318 kg (144 lb) 67.5 kg (148 lb 13 oz)    Exam:  General exam: Pleasant middle-aged female sitting comfortably in bed. Does not appear septic or toxic. Respiratory system: Clear. No increased work of breathing. Cardiovascular system: S1 & S2 heard, RRR. No JVD, murmurs, gallops, clicks or pedal edema.  Gastrointestinal system: Abdomen is nondistended, soft and nontender. Normal bowel sounds heard. Central nervous system: Alert and oriented. No focal neurological deficits. Extremities: Symmetric 5 x 5 power.   Data Reviewed: Basic Metabolic Panel:  Recent Labs Lab 10/26/15 1900 10/27/15 0116 10/28/15 0428  NA 141 142 144  K 3.3* 3.5 3.5  CL 107 116* 118*  CO2 19* 18* 20*  GLUCOSE 129* 111* 87  BUN 70* 63* 36*  CREATININE 3.10* 2.18* 1.01*  CALCIUM 9.4 7.9* 8.5*  MG  --  1.9  --   PHOS  --  4.1  --    Liver Function Tests:  Recent Labs Lab 10/26/15 1900 10/27/15 0116  AST 17 14*  ALT 15 13*  ALKPHOS 47 35*  BILITOT 0.6 0.3  PROT 7.8 6.1*  ALBUMIN 4.7 3.5    Recent Labs Lab 10/26/15 1900  LIPASE 26   No results for input(s): AMMONIA in the last 168 hours. CBC:  Recent Labs Lab 10/26/15 1900 10/27/15 0116  WBC 8.1 5.9  HGB 15.5* 12.6  HCT 46.1* 36.8  MCV 87.0 87.2  PLT 300 216   Cardiac Enzymes: No results for input(s):  CKTOTAL, CKMB, CKMBINDEX, TROPONINI in the last 168 hours. BNP (last 3 results) No results for input(s): PROBNP in the last 8760 hours. CBG:  Recent Labs Lab 10/27/15 1823 10/28/15 0738 10/28/15 1204  GLUCAP 83 77 68    Recent Results (from the past 240 hour(s))  Gastrointestinal Panel by PCR , Stool     Status: None   Collection Time: 10/26/15  8:07 PM  Result Value Ref Range Status   Campylobacter species NOT DETECTED NOT DETECTED Final   Plesimonas shigelloides NOT DETECTED NOT DETECTED Final   Salmonella species NOT DETECTED NOT DETECTED Final   Yersinia enterocolitica NOT DETECTED NOT DETECTED Final   Vibrio species NOT DETECTED NOT DETECTED Final   Vibrio cholerae NOT DETECTED NOT DETECTED Final   Enteroaggregative E coli (EAEC) NOT DETECTED NOT DETECTED Final   Enteropathogenic E coli (EPEC) NOT DETECTED NOT DETECTED Final   Enterotoxigenic E coli (ETEC) NOT DETECTED NOT DETECTED Final   Shiga like toxin producing E coli (STEC) NOT DETECTED NOT DETECTED Final   E. coli O157 NOT DETECTED NOT DETECTED Final   Shigella/Enteroinvasive E coli (EIEC) NOT DETECTED NOT DETECTED Final   Cryptosporidium NOT DETECTED NOT DETECTED Final   Cyclospora cayetanensis NOT DETECTED NOT DETECTED Final   Entamoeba histolytica NOT DETECTED NOT DETECTED Final   Giardia lamblia NOT DETECTED NOT DETECTED Final   Adenovirus F40/41 NOT DETECTED NOT DETECTED Final   Astrovirus NOT DETECTED NOT DETECTED Final   Norovirus GI/GII NOT DETECTED NOT DETECTED Final   Rotavirus A NOT DETECTED NOT DETECTED Final   Sapovirus (I, II, IV, and V) NOT DETECTED NOT DETECTED Final  C difficile quick scan w PCR reflex     Status: None   Collection Time: 10/26/15  9:32 PM  Result Value Ref Range Status   C Diff antigen NEGATIVE NEGATIVE Final   C Diff toxin NEGATIVE NEGATIVE Final   C Diff interpretation Negative for toxigenic C. difficile  Final         Studies: No results found.      Scheduled  Meds: . aspirin EC  81 mg Oral Daily  . atorvastatin  40 mg Oral Daily  . enoxaparin (LOVENOX) injection  30 mg Subcutaneous Q24H  . LORazepam  1 mg Oral QHS,MR X 1  . pregabalin  150 mg Oral BID  . sodium chloride flush  3 mL Intravenous Q12H  . tiZANidine  4 mg Oral QHS   Continuous Infusions: . 0.9 % NaCl with KCl 40 mEq / L 125 mL/hr (10/28/15 0051)    Active Problems:   Chronic diarrhea   Dehydration   Acute renal failure (ARF) ([name])   Hypotension   Hypokalemia   Diabetes mellitus ([name])   Sleep apnea   Diarrhea    Time spent: 25 minutes.    Vernell Leep, MD, FACP, FHM. [facility] Pager [phone]  If 7PM-7AM, please contact  night-coverage [email or web address] Password [email or web address] 10/28/2015, 12:16 PM    LOS: 2 days

## 2015-10-28 NOTE — Consult Note (Signed)
Reason for Consult: Diarrhea Referring Physician: Hospital team  Rachel White is an 65 y.o. female.  HPI: Patient with a ten-day history of diarrhea without obvious new medicine antibiotic sick contact or travel exposures and she says she was diagnosed years ago with IBS but never had a workup and actually has done quite well in the last 8-10 years since her gastric bypass and she lost 92 pounds and then gained 10 and has been pretty stable for years and has been pretty regular with her bowels and has not had any pain and vomiting or fever nor blood when she has no other complaints and actually believes things are beginning to change and she's never had a colonoscopy which we discussed and her family history is negative for any obvious GI illness or diarrhea illness  Past Medical History  Diagnosis Date  . Hypertension   . Neuropathic pain   . Diabetes mellitus without complication ([name])     diet controlled  . Sleep apnea     does not use C_pap  . Pneumonia     as a child  . Headache     migraines years ago  . Irritable bowel syndrome     under control  . Complication of anesthesia     some difficulty waking up after tubes tied    Past Surgical History  Procedure Laterality Date  . Gastric bypass    . Foot surgery Left   . Dilation and curettage of uterus    . Tubal ligation    . Orif tibia plateau Left 10/19/2014    Procedure: OPEN REDUCTION INTERNAL FIXATION (ORIF) LEFT BICONDYLAR TIBIAL PLATEAU;  Surgeon: Leandrew Koyanagi, MD;  Location: [place];  Service: Orthopedics;  Laterality: Left;    Family History  Problem Relation Age of Onset  . Cancer Mother   . Cancer Other   . COPD Other   . Diabetes type II Neg Hx     Social History:  reports that she has been smoking Cigarettes.  She has a 41 pack-year smoking history. She has never used smokeless tobacco. She reports that she does not drink alcohol or use illicit drugs.  Allergies:  Allergies  Allergen Reactions  . Sulfa  Antibiotics Other (See Comments)    Pt. Does not recall the reaction   . Trazodone Other (See Comments)    Visual Disturbances as well as Tingling sensation  . Zolpidem Tartrate Other (See Comments)    Altered mental status     Medications: I have reviewed the patient's current medications.  Results for orders placed or performed during the hospital encounter of 10/26/15 (from the past 48 hour(s))  Lipase, blood     Status: None   Collection Time: 10/26/15  7:00 PM  Result Value Ref Range   Lipase 26 11 - 51 U/L  Comprehensive metabolic panel     Status: Abnormal   Collection Time: 10/26/15  7:00 PM  Result Value Ref Range   Sodium 141 135 - 145 mmol/L   Potassium 3.3 (L) 3.5 - 5.1 mmol/L   Chloride 107 101 - 111 mmol/L   CO2 19 (L) 22 - 32 mmol/L   Glucose, Bld 129 (H) 65 - 99 mg/dL   BUN 70 (H) 6 - 20 mg/dL   Creatinine, Ser 3.10 (H) 0.44 - 1.00 mg/dL   Calcium 9.4 8.9 - 10.3 mg/dL   Total Protein 7.8 6.5 - 8.1 g/dL   Albumin 4.7 3.5 - 5.0 g/dL  AST 17 15 - 41 U/L   ALT 15 14 - 54 U/L   Alkaline Phosphatase 47 38 - 126 U/L   Total Bilirubin 0.6 0.3 - 1.2 mg/dL   GFR calc non Af Amer 15 (L) >60 mL/min   GFR calc Af Amer 17 (L) >60 mL/min    Comment: (NOTE) The eGFR has been calculated using the CKD EPI equation. This calculation has not been validated in all clinical situations. eGFR's persistently <60 mL/min signify possible Chronic Kidney Disease.    Anion gap 15 5 - 15  CBC     Status: Abnormal   Collection Time: 10/26/15  7:00 PM  Result Value Ref Range   WBC 8.1 4.0 - 10.5 K/uL   RBC 5.30 (H) 3.87 - 5.11 MIL/uL   Hemoglobin 15.5 (H) 12.0 - 15.0 g/dL   HCT 46.1 (H) 36.0 - 46.0 %   MCV 87.0 78.0 - 100.0 fL   MCH 29.2 26.0 - 34.0 pg   MCHC 33.6 30.0 - 36.0 g/dL   RDW 13.6 11.5 - 15.5 %   Platelets 300 150 - 400 K/uL  Gastrointestinal Panel by PCR , Stool     Status: None   Collection Time: 10/26/15  8:07 PM  Result Value Ref Range   Campylobacter species  NOT DETECTED NOT DETECTED   Plesimonas shigelloides NOT DETECTED NOT DETECTED   Salmonella species NOT DETECTED NOT DETECTED   Yersinia enterocolitica NOT DETECTED NOT DETECTED   Vibrio species NOT DETECTED NOT DETECTED   Vibrio cholerae NOT DETECTED NOT DETECTED   Enteroaggregative E coli (EAEC) NOT DETECTED NOT DETECTED   Enteropathogenic E coli (EPEC) NOT DETECTED NOT DETECTED   Enterotoxigenic E coli (ETEC) NOT DETECTED NOT DETECTED   Shiga like toxin producing E coli (STEC) NOT DETECTED NOT DETECTED   E. coli O157 NOT DETECTED NOT DETECTED   Shigella/Enteroinvasive E coli (EIEC) NOT DETECTED NOT DETECTED   Cryptosporidium NOT DETECTED NOT DETECTED   Cyclospora cayetanensis NOT DETECTED NOT DETECTED   Entamoeba histolytica NOT DETECTED NOT DETECTED   Giardia lamblia NOT DETECTED NOT DETECTED   Adenovirus F40/41 NOT DETECTED NOT DETECTED   Astrovirus NOT DETECTED NOT DETECTED   Norovirus GI/GII NOT DETECTED NOT DETECTED   Rotavirus A NOT DETECTED NOT DETECTED   Sapovirus (I, II, IV, and V) NOT DETECTED NOT DETECTED  C difficile quick scan w PCR reflex     Status: None   Collection Time: 10/26/15  9:32 PM  Result Value Ref Range   C Diff antigen NEGATIVE NEGATIVE   C Diff toxin NEGATIVE NEGATIVE   C Diff interpretation Negative for toxigenic C. difficile   Lactic acid, plasma     Status: None   Collection Time: 10/26/15 10:09 PM  Result Value Ref Range   Lactic Acid, Venous 1.3 0.5 - 2.0 mmol/L  Urinalysis, Routine w reflex microscopic (not at [facility])     Status: Abnormal   Collection Time: 10/26/15 11:54 PM  Result Value Ref Range   Color, Urine YELLOW YELLOW   APPearance CLOUDY (A) CLEAR   Specific Gravity, Urine 1.022 1.005 - 1.030   pH 5.5 5.0 - 8.0   Glucose, UA NEGATIVE NEGATIVE mg/dL   Hgb urine dipstick TRACE (A) NEGATIVE   Bilirubin Urine SMALL (A) NEGATIVE   Ketones, ur NEGATIVE NEGATIVE mg/dL   Protein, ur 30 (A) NEGATIVE mg/dL   Nitrite NEGATIVE NEGATIVE    Leukocytes, UA TRACE (A) NEGATIVE  Creatinine, urine, random  Status: None   Collection Time: 10/26/15 11:54 PM  Result Value Ref Range   Creatinine, Urine 208.13 mg/dL    Comment: Performed at [facility]  Sodium, urine, random     Status: None   Collection Time: 10/26/15 11:54 PM  Result Value Ref Range   Sodium, Ur 24 mmol/L    Comment: Performed at [facility]  Urine microscopic-add on     Status: Abnormal   Collection Time: 10/26/15 11:54 PM  Result Value Ref Range   Squamous Epithelial / LPF 0-5 (A) NONE SEEN   WBC, UA 0-5 0 - 5 WBC/hpf   RBC / HPF 0-5 0 - 5 RBC/hpf   Bacteria, UA FEW (A) NONE SEEN   Casts GRANULAR CAST (A) NEGATIVE   Urine-Other MUCOUS PRESENT   Magnesium     Status: None   Collection Time: 10/27/15  1:16 AM  Result Value Ref Range   Magnesium 1.9 1.7 - 2.4 mg/dL  Phosphorus     Status: None   Collection Time: 10/27/15  1:16 AM  Result Value Ref Range   Phosphorus 4.1 2.5 - 4.6 mg/dL  TSH     Status: Abnormal   Collection Time: 10/27/15  1:16 AM  Result Value Ref Range   TSH 0.337 (L) 0.350 - 4.500 uIU/mL  Comprehensive metabolic panel     Status: Abnormal   Collection Time: 10/27/15  1:16 AM  Result Value Ref Range   Sodium 142 135 - 145 mmol/L   Potassium 3.5 3.5 - 5.1 mmol/L   Chloride 116 (H) 101 - 111 mmol/L   CO2 18 (L) 22 - 32 mmol/L   Glucose, Bld 111 (H) 65 - 99 mg/dL   BUN 63 (H) 6 - 20 mg/dL   Creatinine, Ser 2.18 (H) 0.44 - 1.00 mg/dL   Calcium 7.9 (L) 8.9 - 10.3 mg/dL   Total Protein 6.1 (L) 6.5 - 8.1 g/dL   Albumin 3.5 3.5 - 5.0 g/dL   AST 14 (L) 15 - 41 U/L   ALT 13 (L) 14 - 54 U/L   Alkaline Phosphatase 35 (L) 38 - 126 U/L   Total Bilirubin 0.3 0.3 - 1.2 mg/dL   GFR calc non Af Amer 23 (L) >60 mL/min   GFR calc Af Amer 26 (L) >60 mL/min    Comment: (NOTE) The eGFR has been calculated using the CKD EPI equation. This calculation has not been validated in all clinical situations. eGFR's persistently <60  mL/min signify possible Chronic Kidney Disease.    Anion gap 8 5 - 15  CBC     Status: None   Collection Time: 10/27/15  1:16 AM  Result Value Ref Range   WBC 5.9 4.0 - 10.5 K/uL   RBC 4.22 3.87 - 5.11 MIL/uL   Hemoglobin 12.6 12.0 - 15.0 g/dL   HCT 36.8 36.0 - 46.0 %   MCV 87.2 78.0 - 100.0 fL   MCH 29.9 26.0 - 34.0 pg   MCHC 34.2 30.0 - 36.0 g/dL   RDW 13.8 11.5 - 15.5 %   Platelets 216 150 - 400 K/uL  Hemoglobin A1c     Status: Abnormal   Collection Time: 10/27/15  1:16 AM  Result Value Ref Range   Hgb A1c MFr Bld 5.7 (H) 4.8 - 5.6 %    Comment: (NOTE)         Pre-diabetes: 5.7 - 6.4         Diabetes: >6.4  Glycemic control for adults with diabetes: <7.0    Mean Plasma Glucose 117 mg/dL    Comment: (NOTE) Performed At: [facility] [place], [place] [id number] Lindon Romp MD QB:[id number]   Glucose, capillary     Status: None   Collection Time: 10/27/15  6:23 PM  Result Value Ref Range   Glucose-Capillary 83 65 - 99 mg/dL  Basic metabolic panel     Status: Abnormal   Collection Time: 10/28/15  4:28 AM  Result Value Ref Range   Sodium 144 135 - 145 mmol/L   Potassium 3.5 3.5 - 5.1 mmol/L   Chloride 118 (H) 101 - 111 mmol/L   CO2 20 (L) 22 - 32 mmol/L   Glucose, Bld 87 65 - 99 mg/dL   BUN 36 (H) 6 - 20 mg/dL   Creatinine, Ser 1.01 (H) 0.44 - 1.00 mg/dL    Comment: DELTA CHECK NOTED REPEATED TO VERIFY    Calcium 8.5 (L) 8.9 - 10.3 mg/dL   GFR calc non Af Amer 57 (L) >60 mL/min   GFR calc Af Amer >60 >60 mL/min    Comment: (NOTE) The eGFR has been calculated using the CKD EPI equation. This calculation has not been validated in all clinical situations. eGFR's persistently <60 mL/min signify possible Chronic Kidney Disease.    Anion gap 6 5 - 15  Glucose, capillary     Status: None   Collection Time: 10/28/15  7:38 AM  Result Value Ref Range   Glucose-Capillary 77 65 - 99 mg/dL   Comment 1 Notify RN   Glucose, capillary      Status: None   Collection Time: 10/28/15 12:04 PM  Result Value Ref Range   Glucose-Capillary 68 65 - 99 mg/dL    No results found.  ROS negative except above Blood pressure 130/65, pulse 66, temperature 97.7 F (36.5 C), temperature source Oral, resp. rate 18, height 5' 3"  (1.6 m), weight 67.5 kg (148 lb 13 oz), SpO2 99 %. Physical Exam Vital signs stable afebrile no acute distress abdomen is soft nontender good bowel sounds labs reviewed improved BUN and creatinine extensive stool study panel negative Assessment/Plan: Diarrhea questionable etiology Plan: We'll check on her tomorrow if she continues to improve she can follow-up with my partner Dr. Amedeo Plenty who she knows from taking care of her brother as an outpatient to discuss screening colonoscopy and she and I discussed that procedure however if diarrhea continues she will need that here and could consider other stool studies like fecal WBCs and fecal fat  Yazmyne Sara E 10/28/2015, 3:54 PM

## 2015-10-29 ENCOUNTER — Encounter (HOSPITAL_COMMUNITY)
Admission: EM | Disposition: A | Discharge status: Home / Self Care | Payer: Self-pay | Source: Home / Self Care | Attending: Internal Medicine

## 2015-10-29 ENCOUNTER — Encounter (HOSPITAL_COMMUNITY): Payer: Self-pay | Admitting: Gastroenterology

## 2015-10-29 HISTORY — PX: COLONOSCOPY: SHX5424

## 2015-10-29 LAB — BASIC METABOLIC PANEL
Anion gap: 9 (ref 5–15)
BUN: 22 mg/dL — ABNORMAL HIGH (ref 6–20)
CHLORIDE: 116 mmol/L — AB (ref 101–111)
CO2: 19 mmol/L — AB (ref 22–32)
CREATININE: 1.19 mg/dL — AB (ref 0.44–1.00)
Calcium: 8.8 mg/dL — ABNORMAL LOW (ref 8.9–10.3)
GFR calc non Af Amer: 47 mL/min — ABNORMAL LOW (ref >60)
GFR, EST AFRICAN AMERICAN: 54 mL/min — AB (ref >60)
GLUCOSE: 88 mg/dL (ref 65–99)
Potassium: 3.7 mmol/L (ref 3.5–5.1)
Sodium: 144 mmol/L (ref 135–145)

## 2015-10-29 SURGERY — COLONOSCOPY
Anesthesia: Moderate Sedation

## 2015-10-29 MED ORDER — MIDAZOLAM HCL 10 MG/2ML IJ SOLN
INTRAMUSCULAR | Status: DC | PRN
  Administered 2015-10-29: 1 mg via INTRAVENOUS
  Administered 2015-10-29: 2 mg via INTRAVENOUS
  Administered 2015-10-29 (×2): 1 mg via INTRAVENOUS
  Administered 2015-10-29: 2 mg via INTRAVENOUS

## 2015-10-29 MED ORDER — FENTANYL CITRATE (PF) 100 MCG/2ML IJ SOLN
INTRAMUSCULAR | Status: AC
  Filled 2015-10-29: qty 2

## 2015-10-29 MED ORDER — MIDAZOLAM HCL 5 MG/ML IJ SOLN
INTRAMUSCULAR | Status: AC
  Filled 2015-10-29: qty 2

## 2015-10-29 MED ORDER — SODIUM CHLORIDE 0.9 % IV SOLN: INTRAVENOUS | Status: DC

## 2015-10-29 MED ORDER — FENTANYL CITRATE (PF) 100 MCG/2ML IJ SOLN
INTRAMUSCULAR | Status: DC | PRN
  Administered 2015-10-29 (×3): 25 ug via INTRAVENOUS

## 2015-10-29 NOTE — Progress Notes (Signed)
Rachel White 8:35 AM  Subjective: Patient says she's a same no better but wants to go home and follow-up as an outpatient and has no new complaints  Objective: Vital signs stable afebrile no acute distress patient and her into bathroom not examined yet but will be before her procedure  Assessment: Diarrhea question etiology  Plan: I have discussed unprepped flexible sigmoidoscopy versus colonoscopy tomorrow and she prefers a try at flexible sigmoidoscopy knowing that the look may be poor and she's been instructed not to eat anything more and will proceed later today with further workup and plans pending those findings but if nothing obvious is seen can wait on biopsies and follow her up as an outpatient if she truly wants to go home  [facility]  Pager [phone] After 5PM or if no answer call [phone]

## 2015-10-29 NOTE — Progress Notes (Signed)
PROGRESS NOTE    Rachel White  [id number]  DOB: Dec 12, 1950  DOA: 10/26/2015 PCP: Wonda Cerise, MD Outpatient Specialists:   Hospital course: 65 year old female patient with history of HTN, diet-controlled DM, OSA not on CPAP, IBS-diarrhea type, presented to the ED on 10/26/15 with a week's history of watery, nonbloody diarrhea which did not improve with Imodium. Associated with generalized weakness and some lightheadedness on admission. History of diarrhea in patient's brother and son's 2 coworkers. In the ED, potassium 3.3, creatinine 3.1. Admitted for management. C. difficile PCR and GI pathogen panel PCR negative. Persisting diarrhea. [facility] consulted and performed flexible sigmoidoscopy. Pathology pending. DC when cleared by GI.   Assessment & Plan:   Diarrhea - Presumed Viral (no fever, leukocytosis, abdominal pain or blood in stools) - C. difficile PCR negative. GI pathogen panel PCR negative. - Treat supportively.  - Mild non-anion gap metabolic acidosis secondary to diarrhea. - Patient states that her diarrhea is not similar to her IBS in the past.  - Initially felt that the diarrhea was improving but then continued to have severe diarrhea (>10 episodes in the last 24 hours). [facility] consulted and performed flexible sigmoidoscopy 4/11: Internal hemorrhoids, one small polyp in the mid sigmoid colon which was biopsied, congested mucosa in the entire examined colon-biopsied - Follow GI recommendations.  Dehydration - Secondary to GI losses. IV fluids. Improved. Continue gentle IV fluids.  Acute kidney injury - Secondary to dehydration from GI losses. Resolved.  Hypokalemia - Replaced. Magnesium is normal.  Hypotension - Resolved after IV fluids.  Diet-controlled DM - Monitor CBGs. Well controlled. DC CBGs.   OSA - Not on CPAP    DVT prophylaxis: Lovenox Code Status: DO NOT RESUSCITATE Family Communication: None at bedside Disposition Plan: DC  home when medically stable   Consultants:  [facility]  Procedures:  Flexible sigmoidoscopy 4/11   Antimicrobials:  None   Subjective: Seen this morning prior to procedure. Continue multiple diarrhea (>10 episodes in the last 24 hours).   Objective: Filed Vitals:   10/28/15 0734 10/28/15 1357 10/28/15 2226 10/29/15 0635  BP: 120/74 130/65 91/53 127/62  Pulse: 57 66 69 90  Temp: 98.2 F (36.8 C) 97.7 F (36.5 C) 98.2 F (36.8 C) 99.7 F (37.6 C)  TempSrc: Oral Oral Oral Oral  Resp: 18 18 16 16   Height:      Weight:      SpO2: 98% 99% 95% 99%   No intake or output data in the 24 hours ending 10/29/15 0714 Filed Weights   10/26/15 1745 10/27/15 0103  Weight: 65.318 kg (144 lb) 67.5 kg (148 lb 13 oz)    Exam:  General exam: Pleasant middle-aged female sitting comfortably in bed. Does not appear septic or toxic. Respiratory system: Clear. No increased work of breathing. Cardiovascular system: S1 & S2 heard, RRR. No JVD, murmurs, gallops, clicks or pedal edema.  Gastrointestinal system: Abdomen is nondistended, soft and nontender. Normal bowel sounds heard. Central nervous system: Alert and oriented. No focal neurological deficits. Extremities: Symmetric 5 x 5 power.   Data Reviewed: Basic Metabolic Panel:  Recent Labs Lab 10/26/15 1900 10/27/15 0116 10/28/15 0428 10/29/15 0451  NA 141 142 144 144  K 3.3* 3.5 3.5 3.7  CL 107 116* 118* 116*  CO2 19* 18* 20* 19*  GLUCOSE 129* 111* 87 88  BUN 70* 63* 36* 22*  CREATININE 3.10* 2.18* 1.01* 1.19*  CALCIUM 9.4 7.9* 8.5* 8.8*  MG  --  1.9  --   --   PHOS  --  4.1  --   --    Liver Function Tests:  Recent Labs Lab 10/26/15 1900 10/27/15 0116  AST 17 14*  ALT 15 13*  ALKPHOS 47 35*  BILITOT 0.6 0.3  PROT 7.8 6.1*  ALBUMIN 4.7 3.5    Recent Labs Lab 10/26/15 1900  LIPASE 26   No results for input(s): AMMONIA in the last 168 hours. CBC:  Recent Labs Lab 10/26/15 1900 10/27/15 0116  WBC 8.1  5.9  HGB 15.5* 12.6  HCT 46.1* 36.8  MCV 87.0 87.2  PLT 300 216   Cardiac Enzymes: No results for input(s): CKTOTAL, CKMB, CKMBINDEX, TROPONINI in the last 168 hours. BNP (last 3 results) No results for input(s): PROBNP in the last 8760 hours. CBG:  Recent Labs Lab 10/27/15 1823 10/28/15 0738 10/28/15 1204  GLUCAP 83 77 68    Recent Results (from the past 240 hour(s))  Gastrointestinal Panel by PCR , Stool     Status: None   Collection Time: 10/26/15  8:07 PM  Result Value Ref Range Status   Campylobacter species NOT DETECTED NOT DETECTED Final   Plesimonas shigelloides NOT DETECTED NOT DETECTED Final   Salmonella species NOT DETECTED NOT DETECTED Final   Yersinia enterocolitica NOT DETECTED NOT DETECTED Final   Vibrio species NOT DETECTED NOT DETECTED Final   Vibrio cholerae NOT DETECTED NOT DETECTED Final   Enteroaggregative E coli (EAEC) NOT DETECTED NOT DETECTED Final   Enteropathogenic E coli (EPEC) NOT DETECTED NOT DETECTED Final   Enterotoxigenic E coli (ETEC) NOT DETECTED NOT DETECTED Final   Shiga like toxin producing E coli (STEC) NOT DETECTED NOT DETECTED Final   E. coli O157 NOT DETECTED NOT DETECTED Final   Shigella/Enteroinvasive E coli (EIEC) NOT DETECTED NOT DETECTED Final   Cryptosporidium NOT DETECTED NOT DETECTED Final   Cyclospora cayetanensis NOT DETECTED NOT DETECTED Final   Entamoeba histolytica NOT DETECTED NOT DETECTED Final   Giardia lamblia NOT DETECTED NOT DETECTED Final   Adenovirus F40/41 NOT DETECTED NOT DETECTED Final   Astrovirus NOT DETECTED NOT DETECTED Final   Norovirus GI/GII NOT DETECTED NOT DETECTED Final   Rotavirus A NOT DETECTED NOT DETECTED Final   Sapovirus (I, II, IV, and V) NOT DETECTED NOT DETECTED Final  C difficile quick scan w PCR reflex     Status: None   Collection Time: 10/26/15  9:32 PM  Result Value Ref Range Status   C Diff antigen NEGATIVE NEGATIVE Final   C Diff toxin NEGATIVE NEGATIVE Final   C Diff  interpretation Negative for toxigenic C. difficile  Final         Studies: No results found.      Scheduled Meds: . aspirin EC  81 mg Oral Daily  . atorvastatin  40 mg Oral Daily  . enoxaparin (LOVENOX) injection  40 mg Subcutaneous Q24H  . LORazepam  1 mg Oral QHS,MR X 1  . pregabalin  150 mg Oral BID  . sodium chloride flush  3 mL Intravenous Q12H  . tiZANidine  4 mg Oral QHS   Continuous Infusions: . 0.9 % NaCl with KCl 40 mEq / L 75 mL/hr (10/29/15 0209)    Active Problems:   Chronic diarrhea   Dehydration   Acute renal failure (ARF) ([name])   Hypotension   Hypokalemia   Diabetes mellitus ([name])   Sleep apnea   Diarrhea    Time spent: 25 minutes.  Vernell Leep, MD, FACP, FHM. [facility] Pager [phone]  If 7PM-7AM, please contact night-coverage [email or web address] Password [email or web address] 10/29/2015, 7:14 AM    LOS: 3 days

## 2015-10-29 NOTE — Care Management Important Message (Signed)
Important Message  Patient Details  Name: MAEGAN FULLMAN MRN: [id number] Date of Birth: [birth year]   Medicare Important Message Given:  Yes    Camillo Flaming 10/29/2015, 10:29 AMImportant Message  Patient Details  Name: Anaila Colby Maione MRN: [id number] Date of Birth: 1950-12-22   Medicare Important Message Given:  Yes    Camillo Flaming 10/29/2015, 10:29 AM

## 2015-10-29 NOTE — Progress Notes (Signed)
Pt's husband Nadara Mustard Panameno wants to be contacted about the results of colonoscopy cell#[phone]

## 2015-10-29 NOTE — Op Note (Signed)
[facility] Patient Name: Rachel White Procedure Date: 10/29/2015 MRN: [id number] Attending MD: Clarene Essex , MD Date of Birth: [birth year] CSN:  Age: 65 Admit Type: Inpatient Procedure:                Colonoscopy Indications:              Clinically significant diarrhea of unexplained                            origin Providers:                Clarene Essex, MD, Malka So, RN, Cletis Athens,                            Technician Referring MD:              Medicines:                Fentanyl 75 micrograms IV, Midazolam 7 mg IV Complications:            No immediate complications. Estimated Blood Loss:     Estimated blood loss: none. Procedure:                Pre-Anesthesia Assessment:                           - Prior to the procedure, a History and Physical                            was performed, and patient medications and                            allergies were reviewed. The patient's tolerance of                            previous anesthesia was also reviewed. The risks                            and benefits of the procedure and the sedation                            options and risks were discussed with the patient.                            All questions were answered, and informed consent                            was obtained. Prior Anticoagulants: The patient has                            taken Lovenox (enoxaparin), last dose was day of                            procedure. ASA Grade Assessment: II - A patient  with mild systemic disease. After reviewing the                            risks and benefits, the patient was deemed in                            satisfactory condition to undergo the procedure.                           After obtaining informed consent, the colonoscope                            was passed under direct vision. Throughout the                            procedure, the patient's blood pressure,  pulse, and                            oxygen saturations were monitored continuously. The                            [id number] scope was introduced through                            the anus and advanced to the the cecum, identified                            by appendiceal orifice and ileocecal valve. After                            obtaining informed consent, the colonoscope was                            passed under direct vision. Throughout the                            procedure, the patient's blood pressure, pulse, and                            oxygen saturations were monitored continuously. The                            ileocecal valve, appendiceal orifice, and rectum                            were photographed. The colonoscopy was performed                            without difficulty. The patient tolerated the                            procedure fairly well. The quality of the bowel  preparation was fair except the splenic flexure                            cecum and descendingwas unsatisfactory. abdominal                            pressure was applied in an effort to enter the                            terminal ileum which was only very briefly seen but                            we could not maintain position but no gross                            abnormalities was seen Scope In: 2:27:36 PM Scope Out: 2:46:04 PM Scope Withdrawal Time: 0 hours 10 minutes 36 seconds  Total Procedure Duration: 0 hours 18 minutes 28 seconds  Findings:      Internal hemorrhoids were found during retroflexion, during perianal       exam and during digital exam. The hemorrhoids were small.      A small polyp was found in the mid sigmoid colon. The polyp was       semi-sessile. Biopsies were taken with a cold forceps for histology.      A large amount of semi-liquid stool was found in the descending colon,       at the splenic flexure and in the cecum,  interfering with visualization.      An area of mildly congested mucosa was found in the entire colon. This       was biopsied with a cold forceps for histology.      The exam was otherwise without abnormality. Impression:               - Internal hemorrhoids.                           - One small polyp in the mid sigmoid colon.                            Biopsied.                           - Stool in the descending colon, at the splenic                            flexure and in the cecum.                           - Congested mucosa in the entire examined colon.                            Biopsied.                           - The examination was otherwise normal.this was an  unprepped exam so some small lesions or larger in                            areas as above could've been missed Moderate Sedation:      Moderate (conscious) sedation was administered by the endoscopy nurse       and supervised by the endoscopist. The following parameters were       monitored: oxygen saturation, heart rate, blood pressure, respiratory       rate, EKG, adequacy of pulmonary ventilation, and response to care. Recommendation:           - Patient has a contact number available for                            emergencies. The signs and symptoms of potential                            delayed complications were discussed with the                            patient. Return to normal activities tomorrow.                            Written discharge instructions were provided to the                            patient.                           - Soft diet today.                           - Continue present medications.                           - Await pathology results.                           - Repeat colonoscopy after studies are complete for                            surveillance based on pathology results.                           - Return to GI office PRN.                            - Telephone GI clinic for pathology results in 1                            week.                           - Telephone GI clinic if symptomatic PRN. Procedure Code(s):        --- Professional ---  45380, Colonoscopy, flexible; with biopsy, single                            or multiple Diagnosis Code(s):        --- Professional ---                           D12.5, Benign neoplasm of sigmoid colon                           K63.89, Other specified diseases of intestine                           R19.7, Diarrhea, unspecified CPT copyright 2016 [employer] Association. All rights reserved. The codes documented in this report are preliminary and upon coder review may  be revised to meet current compliance requirements. Clarene Essex, MD Clarene Essex, MD 10/29/2015 2:57:55 PM This report has been signed electronically. Number of Addenda: 0

## 2015-10-30 DIAGNOSIS — I9589 Other hypotension: Secondary | ICD-10-CM

## 2015-10-30 DIAGNOSIS — E860 Dehydration: Secondary | ICD-10-CM

## 2015-10-30 DIAGNOSIS — N179 Acute kidney failure, unspecified: Secondary | ICD-10-CM

## 2015-10-30 DIAGNOSIS — E876 Hypokalemia: Secondary | ICD-10-CM

## 2015-10-30 DIAGNOSIS — K529 Noninfective gastroenteritis and colitis, unspecified: Secondary | ICD-10-CM

## 2015-10-30 LAB — BASIC METABOLIC PANEL
Anion gap: 8 (ref 5–15)
BUN: 22 mg/dL — ABNORMAL HIGH (ref 6–20)
CHLORIDE: 114 mmol/L — AB (ref 101–111)
CO2: 23 mmol/L (ref 22–32)
Calcium: 8.8 mg/dL — ABNORMAL LOW (ref 8.9–10.3)
Creatinine, Ser: 1.06 mg/dL — ABNORMAL HIGH (ref 0.44–1.00)
GFR calc non Af Amer: 54 mL/min — ABNORMAL LOW (ref >60)
Glucose, Bld: 87 mg/dL (ref 65–99)
POTASSIUM: 2.9 mmol/L — AB (ref 3.5–5.1)
SODIUM: 145 mmol/L (ref 135–145)

## 2015-10-30 LAB — GLUCOSE, CAPILLARY
GLUCOSE-CAPILLARY: 135 mg/dL — AB (ref 65–99)
GLUCOSE-CAPILLARY: 57 mg/dL — AB (ref 65–99)
GLUCOSE-CAPILLARY: 129 mg/dL — AB (ref 65–99)
Glucose-Capillary: 61 mg/dL — ABNORMAL LOW (ref 65–99)

## 2015-10-30 MED ORDER — DIPHENOXYLATE-ATROPINE 2.5-0.025 MG PO TABS: 1.0000 | ORAL_TABLET | Freq: Every day | ORAL | Status: DC | PRN

## 2015-10-30 MED ORDER — POTASSIUM CHLORIDE CRYS ER 20 MEQ PO TBCR
40.0000 meq | EXTENDED_RELEASE_TABLET | Freq: Four times a day (QID) | ORAL | Status: DC
  Administered 2015-10-30: 40 meq via ORAL
  Filled 2015-10-30: qty 2

## 2015-10-30 MED ORDER — POTASSIUM CHLORIDE CRYS ER 20 MEQ PO TBCR: 40.0000 meq | EXTENDED_RELEASE_TABLET | Freq: Every day | ORAL | Status: DC

## 2015-10-30 NOTE — Progress Notes (Addendum)
CRITICAL VALUE ALERT  Critical value received:  Blood sugar 57  Date of notification:  10/30/15  Time of notification:  1200  Critical value read back:No.  Nurse who received alert:  Stanton Kidney Kendyn Zaman  MD notified (1st page):  Dr. Carles Collet  Time of first page:  1204  MD notified (2nd page):  Time of second page:  Responding MD:  Dr. Carles Collet  Time MD responded:  1225   Hypoglycemic protocol initiated.  Orange juice and crackers given.  Will recheck per protocol.   Recheck 129.

## 2015-10-30 NOTE — Progress Notes (Addendum)
CRITICAL VALUE ALERT  Critical value received:  Blood sugar 61   Date of notification:  10/30/15  Time of notification:  0735  Critical value read back:No.  Nurse who received alert:  Stanton Kidney Odeal Welden  MD notified (1st page):  Text paged  Time of first page:    MD notified (2nd page):  Time of second page:  Responding MD:    Time MD responded:    Hypoglycemic protocol initiated by RN.  Orange juice given.  BS rechecked= 135.

## 2015-10-30 NOTE — Discharge Summary (Signed)
Physician Discharge Summary  Lemoyne Mcmath Babson [id number] DOB: Jan 13, 1951 DOA: 10/26/2015  PCP: Wonda Cerise, MD  Admit date: 10/26/2015 Discharge date: 10/30/2015  Recommendations for Outpatient Follow-up:  1. Pt will need to follow up with PCP in 2 weeks post discharge 2. Please obtain BMP in 1-2 weeks 3. Follow up with GI, Dr. Watt Climes in 1-2 weeks Discharge Diagnoses:  Diarrhea - Presumed Viral (no fever, leukocytosis, abdominal pain or blood in stools) - C. difficile PCR negative. GI pathogen panel PCR negative. - Treat supportively.  - Mild non-anion gap metabolic acidosis secondary to diarrhea--overall improving with decreased volume of sttol - Patient states that her diarrhea is not similar to her IBS in the past.  -although pt continued to have loose stool, she stated volume is less and more substance in stool - Initially felt that the diarrhea was improving but then continued to have severe diarrhea (>10 episodes in the last 24 hours). [facility] consulted and performed flexible colonoscopy 4/11: Internal hemorrhoids, one small polyp in the mid sigmoid colon which was biopsied, congested mucosa in the entire examined colon-biopsied--pathology pending at time of d/c - Follow GI recommendations.  Dehydration - Secondary to GI losses. IV fluids. Improved. Continue gentle IV fluids.  Acute kidney injury - Secondary to dehydration from GI losses. Resolved. -Serum creatinine 3.10 on the day of admission -Serum creatinine 1.06 on discharge day  Hypokalemia - Replaced. Magnesium is normal.  Hypotension - Resolved after IV fluids. - restart amlodipine -hold lisinopril in setting of AKI--follow up with PCP to determine if need to restart in future  Diet-controlled DM - Monitor CBGs. Well controlled. DC CBGs.   OSA - Not on CPAP  Tobacco abuse -no desire to quit -cessation discussed  Discharge Condition: stable  Disposition: Stable Follow-up Information    Follow up with [facility], MD In 1 week.   Specialty:  Gastroenterology   Contact information:   [id number] [place] [address] [phone]       Diet:soft Wt Readings from Last 3 Encounters:  10/29/15 67.132 kg (148 lb)  03/03/15 70.761 kg (156 lb)  10/19/14 70.761 kg (156 lb)    History of present illness:  65 year old female patient with history of HTN, diet-controlled DM, OSA not on CPAP, IBS-diarrhea type, presented to the ED on 10/26/15 with a week's history of watery, nonbloody diarrhea which did not improve with Imodium. Associated with generalized weakness and some lightheadedness on admission. History of diarrhea in patient's brother and son's 2 coworkers. In the ED, potassium 3.3, creatinine 3.1. Admitted for management. C. difficile PCR and GI pathogen panel PCR negative. Persisting diarrhea. [facility] consulted and performed colonoscopy which showed internal hemorrhoids, single polyp in the sigmoid, and congested mucosa in the entire colon. Pathology pending. The patient will follow-up with Dr. Watt Climes in the outpatient setting. Although the patient continued to have loose stool, the volume and number continued to decrease. Her diet was advanced which she tolerated.  Consultants: [facility]  Discharge Exam: Filed Vitals:   10/29/15 2134 10/30/15 0446  BP: 126/72 136/65  Pulse: 71 64  Temp: 98.2 F (36.8 C) 98.5 F (36.9 C)  Resp: 20 20   Filed Vitals:   10/29/15 1520 10/29/15 1532 10/29/15 2134 10/30/15 0446  BP: 100/44 114/68 126/72 136/65  Pulse: 67 69 71 64  Temp:  97.5 F (36.4 C) 98.2 F (36.8 C) 98.5 F (36.9 C)  TempSrc:   Oral Oral  Resp: 15 18  20 20  Height:      Weight:      SpO2: 96% 97% 100% 100%   General: A&O x 3, NAD, pleasant, cooperative Cardiovascular: RRR, no rub, no gallop, no S3 Respiratory: CTAB, no wheeze, no rhonchi Abdomen:soft, nontender, nondistended, positive bowel sounds Extremities: No edema, No lymphangitis,  no petechiae  Discharge Instructions  Discharge Instructions    Diet - low sodium heart healthy    Complete by:  As directed      Increase activity slowly    Complete by:  As directed             Medication List    STOP taking these medications        lisinopril 10 MG tablet  Commonly known as:  PRINIVIL,ZESTRIL     loperamide 1 MG/5ML solution  Commonly known as:  IMODIUM      TAKE these medications        amLODipine 5 MG tablet  Commonly known as:  NORVASC  Take 5 mg by mouth daily.     aspirin EC 81 MG tablet  Take 81 mg by mouth daily.     atorvastatin 40 MG tablet  Commonly known as:  LIPITOR  Take 40 mg by mouth daily.     Calcium-Vitamin D 600-125 MG-UNIT Tabs  Take 2 tablets by mouth daily.     diphenoxylate-atropine 2.5-0.025 MG tablet  Commonly known as:  LOMOTIL  Take 1 tablet by mouth daily as needed for diarrhea or loose stools.     LORazepam 1 MG tablet  Commonly known as:  ATIVAN  Take 1 tablet (1 mg total) by mouth at bedtime and may repeat dose one time if needed.     Melatonin 10 MG Caps  Take 20 mg by mouth at bedtime as needed (SLEEP).     meloxicam 15 MG tablet  Commonly known as:  MOBIC  Take 15 mg by mouth daily.     pregabalin 150 MG capsule  Commonly known as:  LYRICA  Take 150 mg by mouth 2 (two) times daily.     tiZANidine 2 MG tablet  Commonly known as:  ZANAFLEX  Take 4 mg by mouth at bedtime.         The results of significant diagnostics from this hospitalization (including imaging, microbiology, ancillary and laboratory) are listed below for reference.    Significant Diagnostic Studies: No results found.   Microbiology: Recent Results (from the past 240 hour(s))  Gastrointestinal Panel by PCR , Stool     Status: None   Collection Time: 10/26/15  8:07 PM  Result Value Ref Range Status   Campylobacter species NOT DETECTED NOT DETECTED Final   Plesimonas shigelloides NOT DETECTED NOT DETECTED Final    Salmonella species NOT DETECTED NOT DETECTED Final   Yersinia enterocolitica NOT DETECTED NOT DETECTED Final   Vibrio species NOT DETECTED NOT DETECTED Final   Vibrio cholerae NOT DETECTED NOT DETECTED Final   Enteroaggregative E coli (EAEC) NOT DETECTED NOT DETECTED Final   Enteropathogenic E coli (EPEC) NOT DETECTED NOT DETECTED Final   Enterotoxigenic E coli (ETEC) NOT DETECTED NOT DETECTED Final   Shiga like toxin producing E coli (STEC) NOT DETECTED NOT DETECTED Final   E. coli O157 NOT DETECTED NOT DETECTED Final   Shigella/Enteroinvasive E coli (EIEC) NOT DETECTED NOT DETECTED Final   Cryptosporidium NOT DETECTED NOT DETECTED Final   Cyclospora cayetanensis NOT DETECTED NOT DETECTED Final   Entamoeba histolytica NOT DETECTED NOT  DETECTED Final   Giardia lamblia NOT DETECTED NOT DETECTED Final   Adenovirus F40/41 NOT DETECTED NOT DETECTED Final   Astrovirus NOT DETECTED NOT DETECTED Final   Norovirus GI/GII NOT DETECTED NOT DETECTED Final   Rotavirus A NOT DETECTED NOT DETECTED Final   Sapovirus (I, II, IV, and V) NOT DETECTED NOT DETECTED Final  C difficile quick scan w PCR reflex     Status: None   Collection Time: 10/26/15  9:32 PM  Result Value Ref Range Status   C Diff antigen NEGATIVE NEGATIVE Final   C Diff toxin NEGATIVE NEGATIVE Final   C Diff interpretation Negative for toxigenic C. difficile  Final     Labs: Basic Metabolic Panel:  Recent Labs Lab 10/26/15 1900 10/27/15 0116 10/28/15 0428 10/29/15 0451 10/30/15 0450  NA 141 142 144 144 145  K 3.3* 3.5 3.5 3.7 2.9*  CL 107 116* 118* 116* 114*  CO2 19* 18* 20* 19* 23  GLUCOSE 129* 111* 87 88 87  BUN 70* 63* 36* 22* 22*  CREATININE 3.10* 2.18* 1.01* 1.19* 1.06*  CALCIUM 9.4 7.9* 8.5* 8.8* 8.8*  MG  --  1.9  --   --   --   PHOS  --  4.1  --   --   --    Liver Function Tests:  Recent Labs Lab 10/26/15 1900 10/27/15 0116  AST 17 14*  ALT 15 13*  ALKPHOS 47 35*  BILITOT 0.6 0.3  PROT 7.8 6.1*    ALBUMIN 4.7 3.5    Recent Labs Lab 10/26/15 1900  LIPASE 26   No results for input(s): AMMONIA in the last 168 hours. CBC:  Recent Labs Lab 10/26/15 1900 10/27/15 0116  WBC 8.1 5.9  HGB 15.5* 12.6  HCT 46.1* 36.8  MCV 87.0 87.2  PLT 300 216   Cardiac Enzymes: No results for input(s): CKTOTAL, CKMB, CKMBINDEX, TROPONINI in the last 168 hours. BNP: Invalid input(s): POCBNP CBG:  Recent Labs Lab 10/27/15 1823 10/28/15 0738 10/28/15 1204 10/30/15 0733 10/30/15 0813  GLUCAP 83 77 68 61* 135*    Time coordinating discharge:  Greater than 30 minutes  Signed:  Charan Prieto, DO [facility] Pager: [phone] 10/30/2015, 11:53 AM

## 2015-10-30 NOTE — Progress Notes (Signed)
[name] L Gravley 1:00 PM  Subjective: Patient doing better with less diarrhea and is going home today but she did not like the colonoscopy which was discussed with her husband in the future we will use propofol and the results were discussed and she has no new complaints  Objective: Vital signs stable afebrile no acute distress abdomen is soft nontender  Assessment: Improved diarrhea  Plan: She will call me when necessary otherwise we will touch base with the biopsies and recheck symptoms and then discussed how to proceed based on how she is doing and those results and have asked her to call me Monday afternoon if she has not heard from me  [facility] E  Pager [phone] After 5PM or if no answer call [phone]

## 2015-10-30 NOTE — Progress Notes (Signed)
Patient discharged @ 1400 in stable condition.  Educated pt regarding diarrhea, dehydration and hypoglycemia.  Prescription given.  Discharge instructions given.  Teach back completed.

## 2015-11-02 ENCOUNTER — Emergency Department (HOSPITAL_COMMUNITY)
Admission: EM | Admit: 2015-11-02 | Discharge: 2015-11-02 | Disposition: A | Discharge status: Home / Self Care | Payer: Medicare Other | Attending: Emergency Medicine | Admitting: Emergency Medicine

## 2015-11-02 ENCOUNTER — Encounter (HOSPITAL_COMMUNITY): Payer: Self-pay | Admitting: *Deleted

## 2015-11-02 DIAGNOSIS — Z79899 Other long term (current) drug therapy: Secondary | ICD-10-CM | POA: Diagnosis not present

## 2015-11-02 DIAGNOSIS — F17210 Nicotine dependence, cigarettes, uncomplicated: Secondary | ICD-10-CM | POA: Insufficient documentation

## 2015-11-02 DIAGNOSIS — Z8701 Personal history of pneumonia (recurrent): Secondary | ICD-10-CM | POA: Insufficient documentation

## 2015-11-02 DIAGNOSIS — G4730 Sleep apnea, unspecified: Secondary | ICD-10-CM | POA: Insufficient documentation

## 2015-11-02 DIAGNOSIS — Z9981 Dependence on supplemental oxygen: Secondary | ICD-10-CM | POA: Insufficient documentation

## 2015-11-02 DIAGNOSIS — G43909 Migraine, unspecified, not intractable, without status migrainosus: Secondary | ICD-10-CM | POA: Diagnosis not present

## 2015-11-02 DIAGNOSIS — E876 Hypokalemia: Secondary | ICD-10-CM | POA: Insufficient documentation

## 2015-11-02 DIAGNOSIS — Z9884 Bariatric surgery status: Secondary | ICD-10-CM | POA: Insufficient documentation

## 2015-11-02 DIAGNOSIS — E119 Type 2 diabetes mellitus without complications: Secondary | ICD-10-CM | POA: Insufficient documentation

## 2015-11-02 DIAGNOSIS — I10 Essential (primary) hypertension: Secondary | ICD-10-CM | POA: Diagnosis not present

## 2015-11-02 DIAGNOSIS — K625 Hemorrhage of anus and rectum: Secondary | ICD-10-CM | POA: Diagnosis present

## 2015-11-02 DIAGNOSIS — R197 Diarrhea, unspecified: Secondary | ICD-10-CM | POA: Insufficient documentation

## 2015-11-02 DIAGNOSIS — Z7982 Long term (current) use of aspirin: Secondary | ICD-10-CM | POA: Diagnosis not present

## 2015-11-02 LAB — COMPREHENSIVE METABOLIC PANEL
ALK PHOS: 39 U/L (ref 38–126)
ALT: 36 U/L (ref 14–54)
ANION GAP: 10 (ref 5–15)
AST: 28 U/L (ref 15–41)
Albumin: 3.3 g/dL — ABNORMAL LOW (ref 3.5–5.0)
BUN: 20 mg/dL (ref 6–20)
CALCIUM: 8.7 mg/dL — AB (ref 8.9–10.3)
CO2: 25 mmol/L (ref 22–32)
Chloride: 109 mmol/L (ref 101–111)
Creatinine, Ser: 0.99 mg/dL (ref 0.44–1.00)
GFR, EST NON AFRICAN AMERICAN: 59 mL/min — AB (ref >60)
Glucose, Bld: 89 mg/dL (ref 65–99)
Potassium: 3.1 mmol/L — ABNORMAL LOW (ref 3.5–5.1)
Sodium: 144 mmol/L (ref 135–145)
Total Bilirubin: 0.6 mg/dL (ref 0.3–1.2)
Total Protein: 6.4 g/dL — ABNORMAL LOW (ref 6.5–8.1)

## 2015-11-02 LAB — CBC
HCT: 38.8 % (ref 36.0–46.0)
Hemoglobin: 13.5 g/dL (ref 12.0–15.0)
MCH: 29.5 pg (ref 26.0–34.0)
MCHC: 34.8 g/dL (ref 30.0–36.0)
MCV: 84.7 fL (ref 78.0–100.0)
PLATELETS: 229 10*3/uL (ref 150–400)
RBC: 4.58 MIL/uL (ref 3.87–5.11)
RDW: 13.4 % (ref 11.5–15.5)
WBC: 12.9 10*3/uL — ABNORMAL HIGH (ref 4.0–10.5)

## 2015-11-02 LAB — PROTIME-INR
INR: 1 (ref 0.00–1.49)
Prothrombin Time: 13.4 seconds (ref 11.6–15.2)

## 2015-11-02 LAB — APTT: aPTT: 29 seconds (ref 24–37)

## 2015-11-02 MED ORDER — POTASSIUM CHLORIDE CRYS ER 20 MEQ PO TBCR
40.0000 meq | EXTENDED_RELEASE_TABLET | Freq: Once | ORAL | Status: AC
  Administered 2015-11-02: 40 meq via ORAL
  Filled 2015-11-02: qty 2

## 2015-11-02 NOTE — ED Provider Notes (Signed)
CSN: [id number]     Arrival date & time 11/02/15  0849 History   First MD Initiated Contact with Patient 11/02/15 [phone]     Chief Complaint  Patient presents with  . Rectal Bleeding     (Consider location/radiation/quality/duration/timing/severity/associated sxs/prior Treatment) HPI  Pt presenting with c/o diarrhea and rectal bleeding.  Pt was recently discharged from the hospital for diarrrhea- she had colonoscopy 4 days ago- she did not see any blood in her stool until this morning.  She states she has continued having approx 10 stools per day, but they were becming more formed.  This morning however she states she had 2 large liquid bowel movements and the second episode had blood and mucous.   No abdominal pain.  No fever/chills.  No dizziness upon standing or fainting.  There are no other associated systemic symptoms, there are no other alleviating or modifying factors.   Past Medical History  Diagnosis Date  . Hypertension   . Neuropathic pain   . Diabetes mellitus without complication ([name])     diet controlled  . Sleep apnea     does not use C_pap  . Pneumonia     as a child  . Headache     migraines years ago  . Irritable bowel syndrome     under control  . Complication of anesthesia     some difficulty waking up after tubes tied   Past Surgical History  Procedure Laterality Date  . Gastric bypass    . Foot surgery Left   . Dilation and curettage of uterus    . Tubal ligation    . Orif tibia plateau Left 10/19/2014    Procedure: OPEN REDUCTION INTERNAL FIXATION (ORIF) LEFT BICONDYLAR TIBIAL PLATEAU;  Surgeon: Leandrew Koyanagi, MD;  Location: [facility];  Service: Orthopedics;  Laterality: Left;   Family History  Problem Relation Age of Onset  . Cancer Mother   . Cancer Other   . COPD Other   . Diabetes type II Neg Hx    Social History  Substance Use Topics  . Smoking status: Current Every Day Smoker -- 1.00 packs/day for 41 years    Types: Cigarettes  . Smokeless  tobacco: Never Used  . Alcohol Use: No   OB History    No data available     Review of Systems  ROS reviewed and all otherwise negative except for mentioned in HPI    Allergies  Sulfa antibiotics; Trazodone; and Zolpidem tartrate  Home Medications   Prior to Admission medications   Medication Sig Start Date End Date Taking? Authorizing Provider  amLODipine (NORVASC) 5 MG tablet Take 5 mg by mouth daily.   Yes Historical Provider, MD  aspirin EC 81 MG tablet Take 81 mg by mouth daily.   Yes Historical Provider, MD  atorvastatin (LIPITOR) 40 MG tablet Take 40 mg by mouth daily. evening   Yes Historical Provider, MD  Calcium Carbonate-Vitamin D (CALCIUM-VITAMIN D) 600-125 MG-UNIT TABS Take 2 tablets by mouth daily.   Yes Historical Provider, MD  diphenoxylate-atropine (LOMOTIL) 2.5-0.025 MG tablet Take 1 tablet by mouth daily as needed for diarrhea or loose stools. 10/30/15  Yes Orson Eva, MD  lisinopril (PRINIVIL,ZESTRIL) 10 MG tablet Take 10 mg by mouth daily. 10/11/15  Yes Historical Provider, MD  LORazepam (ATIVAN) 1 MG tablet Take 1 tablet (1 mg total) by mouth at bedtime and may repeat dose one time if needed. Patient taking differently: Take 1 mg by mouth at bedtime.  08/01/12  Yes Robyn Haber, MD  Melatonin 10 MG CAPS Take 20 mg by mouth at bedtime.    Yes Historical Provider, MD  meloxicam (MOBIC) 15 MG tablet Take 15 mg by mouth daily as needed for pain.    Yes Historical Provider, MD  pregabalin (LYRICA) 150 MG capsule Take 150 mg by mouth 2 (two) times daily. 1 pm & 7 pm   Yes Historical Provider, MD  tiZANidine (ZANAFLEX) 2 MG tablet Take 4 mg by mouth at bedtime.   Yes Historical Provider, MD  potassium chloride SA (K-DUR,KLOR-CON) 20 MEQ tablet Take 2 tablets (40 mEq total) by mouth daily. X 2 days Patient not taking: Reported on 11/02/2015 10/30/15   Orson Eva, MD   BP 107/84 mmHg  Pulse 62  Temp(Src) 98 F (36.7 C) (Oral)  Resp 18  Ht 5\' 3"  (1.6 m)  Wt 146 lb  (66.225 kg)  BMI 25.87 kg/m2  SpO2 100%  Vitals reviewed Physical Exam  Physical Examination: General appearance - alert, well appearing, and in no distress Mental status - alert, oriented to person, place, and time Eyes - no conjunctival injection no scleral icterus Mouth - mucous membranes moist, pharynx normal without lesions Chest - clear to auscultation, no wheezes, rales or rhonchi, symmetric air entry Heart - normal rate, regular rhythm, normal S1, S2, no murmurs, rubs, clicks or gallops Abdomen - soft, nontender, nondistended, no masses or organomegaly Rectal - normal rectal, no masses, no gross blood in DRE, no tenderness Neurological - alert, oriented, normal speech Extremities - peripheral pulses normal, no pedal edema, no clubbing or cyanosis Skin - normal coloration and turgor, no rashes,  ED Course  Procedures (including critical care time) Labs Review Labs Reviewed  CBC - Abnormal; Notable for the following:    WBC 12.9 (*)    All other components within normal limits  COMPREHENSIVE METABOLIC PANEL - Abnormal; Notable for the following:    Potassium 3.1 (*)    Calcium 8.7 (*)    Total Protein 6.4 (*)    Albumin 3.3 (*)    GFR calc non Af Amer 59 (*)    All other components within normal limits  PROTIME-INR  APTT  POC OCCULT BLOOD, ED    Imaging Review No results found. I have personally reviewed and evaluated these images and lab results as part of my medical decision-making.   EKG Interpretation None      MDM   Final diagnoses:  Diarrhea, unspecified type  Rectal bleeding  Hypokalemia    Pt presenting with c/o diarrhea with bright red blood this morning- she was recently hospitalized for diarrhe with dehydration- colonoscopy performed.  Pt has normal hgb today, no blood on rectal exam.  Her vital signs are stable, abdominal exam is benign.  Potassium low at 3.1 and this was repleted.    10:36 AM d/w Dr. Watt Climes, he states pt is OK to f/u with him  in 1-2 weeks.  Bleeding may be from internal hemmorhoid or from biopsy site, but there is no further treatment needed at this time.    Discharged with strict return precautions.  Pt agreeable with plan.  Alfonzo Beers, MD 11/02/15 1126

## 2015-11-02 NOTE — Discharge Instructions (Signed)
Return to the ED with any concerns including fainting, vomiting and not able to keep down liquids, abdominal pain, decreased level of alertness/lethargy, or any other alarming symptoms

## 2015-11-02 NOTE — ED Notes (Signed)
MD at bedside. 

## 2015-11-02 NOTE — ED Notes (Signed)
Pt c/o rectal bleeding x 2 hrs, recent colonoscopy x 4 days ago

## 2015-11-05 ENCOUNTER — Encounter (HOSPITAL_COMMUNITY): Payer: Self-pay | Admitting: Gastroenterology

## 2015-11-08 ENCOUNTER — Inpatient Hospital Stay (HOSPITAL_COMMUNITY)
Admission: EM | Admit: 2015-11-08 | Discharge: 2015-11-11 | DRG: 151 | Disposition: A | Discharge status: Home / Self Care | Payer: Medicare Other | Attending: Internal Medicine | Admitting: Internal Medicine

## 2015-11-08 ENCOUNTER — Encounter (HOSPITAL_COMMUNITY): Payer: Self-pay | Admitting: *Deleted

## 2015-11-08 DIAGNOSIS — N183 Chronic kidney disease, stage 3 unspecified: Secondary | ICD-10-CM | POA: Insufficient documentation

## 2015-11-08 DIAGNOSIS — Z882 Allergy status to sulfonamides status: Secondary | ICD-10-CM

## 2015-11-08 DIAGNOSIS — Z7982 Long term (current) use of aspirin: Secondary | ICD-10-CM

## 2015-11-08 DIAGNOSIS — K529 Noninfective gastroenteritis and colitis, unspecified: Secondary | ICD-10-CM

## 2015-11-08 DIAGNOSIS — G4730 Sleep apnea, unspecified: Secondary | ICD-10-CM | POA: Diagnosis present

## 2015-11-08 DIAGNOSIS — R040 Epistaxis: Secondary | ICD-10-CM | POA: Diagnosis not present

## 2015-11-08 DIAGNOSIS — E561 Deficiency of vitamin K: Secondary | ICD-10-CM | POA: Diagnosis present

## 2015-11-08 DIAGNOSIS — Z66 Do not resuscitate: Secondary | ICD-10-CM | POA: Diagnosis present

## 2015-11-08 DIAGNOSIS — Z888 Allergy status to other drugs, medicaments and biological substances status: Secondary | ICD-10-CM

## 2015-11-08 DIAGNOSIS — D62 Acute posthemorrhagic anemia: Secondary | ICD-10-CM | POA: Diagnosis present

## 2015-11-08 DIAGNOSIS — I10 Essential (primary) hypertension: Secondary | ICD-10-CM | POA: Diagnosis present

## 2015-11-08 DIAGNOSIS — E119 Type 2 diabetes mellitus without complications: Secondary | ICD-10-CM

## 2015-11-08 DIAGNOSIS — F17210 Nicotine dependence, cigarettes, uncomplicated: Secondary | ICD-10-CM | POA: Diagnosis present

## 2015-11-08 DIAGNOSIS — D899 Disorder involving the immune mechanism, unspecified: Secondary | ICD-10-CM | POA: Diagnosis present

## 2015-11-08 DIAGNOSIS — G629 Polyneuropathy, unspecified: Secondary | ICD-10-CM

## 2015-11-08 DIAGNOSIS — E1142 Type 2 diabetes mellitus with diabetic polyneuropathy: Secondary | ICD-10-CM | POA: Diagnosis present

## 2015-11-08 DIAGNOSIS — Z79899 Other long term (current) drug therapy: Secondary | ICD-10-CM

## 2015-11-08 LAB — CBC
HCT: 34.1 % — ABNORMAL LOW (ref 36.0–46.0)
HEMOGLOBIN: 11.5 g/dL — AB (ref 12.0–15.0)
MCH: 28.8 pg (ref 26.0–34.0)
MCHC: 33.7 g/dL (ref 30.0–36.0)
MCV: 85.5 fL (ref 78.0–100.0)
PLATELETS: 324 10*3/uL (ref 150–400)
RBC: 3.99 MIL/uL (ref 3.87–5.11)
RDW: 13.3 % (ref 11.5–15.5)
WBC: 13.5 10*3/uL — ABNORMAL HIGH (ref 4.0–10.5)

## 2015-11-08 LAB — PROTIME-INR
INR: 1.12 (ref 0.00–1.49)
Prothrombin Time: 14.2 seconds (ref 11.6–15.2)

## 2015-11-08 LAB — I-STAT CHEM 8, ED
BUN: 19 mg/dL (ref 6–20)
CALCIUM ION: 1.1 mmol/L — AB (ref 1.13–1.30)
Chloride: 107 mmol/L (ref 101–111)
Creatinine, Ser: 0.9 mg/dL (ref 0.44–1.00)
Glucose, Bld: 90 mg/dL (ref 65–99)
HEMATOCRIT: 34 % — AB (ref 36.0–46.0)
HEMOGLOBIN: 11.6 g/dL — AB (ref 12.0–15.0)
Potassium: 4.2 mmol/L (ref 3.5–5.1)
SODIUM: 142 mmol/L (ref 135–145)
TCO2: 22 mmol/L (ref 0–100)

## 2015-11-08 LAB — APTT: APTT: 29 s (ref 24–37)

## 2015-11-08 MED ORDER — TIZANIDINE HCL 4 MG PO TABS
4.0000 mg | ORAL_TABLET | Freq: Every day | ORAL | Status: DC
  Administered 2015-11-08 – 2015-11-10 (×3): 4 mg via ORAL
  Filled 2015-11-08 (×4): qty 1

## 2015-11-08 MED ORDER — ACETAMINOPHEN 325 MG PO TABS
650.0000 mg | ORAL_TABLET | Freq: Four times a day (QID) | ORAL | Status: DC | PRN
  Administered 2015-11-09: 650 mg via ORAL
  Filled 2015-11-08: qty 2

## 2015-11-08 MED ORDER — LORAZEPAM 1 MG PO TABS
1.0000 mg | ORAL_TABLET | Freq: Every evening | ORAL | Status: DC | PRN
  Administered 2015-11-08 – 2015-11-10 (×3): 1 mg via ORAL
  Filled 2015-11-08 (×3): qty 1

## 2015-11-08 MED ORDER — AMLODIPINE BESYLATE 5 MG PO TABS
5.0000 mg | ORAL_TABLET | Freq: Every day | ORAL | Status: DC
  Administered 2015-11-09 – 2015-11-11 (×3): 5 mg via ORAL
  Filled 2015-11-08 (×3): qty 1

## 2015-11-08 MED ORDER — PREGABALIN 75 MG PO CAPS
150.0000 mg | ORAL_CAPSULE | Freq: Two times a day (BID) | ORAL | Status: DC
  Administered 2015-11-08 – 2015-11-10 (×5): 150 mg via ORAL
  Filled 2015-11-08 (×5): qty 2

## 2015-11-08 MED ORDER — SILVER NITRATE-POT NITRATE 75-25 % EX MISC
1.0000 | Freq: Once | CUTANEOUS | Status: DC
  Filled 2015-11-08: qty 1

## 2015-11-08 MED ORDER — ATORVASTATIN CALCIUM 40 MG PO TABS
40.0000 mg | ORAL_TABLET | Freq: Every day | ORAL | Status: DC
  Administered 2015-11-08 – 2015-11-11 (×4): 40 mg via ORAL
  Filled 2015-11-08 (×4): qty 1

## 2015-11-08 MED ORDER — CALCIUM CARBONATE-VITAMIN D 500-200 MG-UNIT PO TABS
2.0000 | ORAL_TABLET | Freq: Every day | ORAL | Status: DC
  Administered 2015-11-09 – 2015-11-11 (×3): 2 via ORAL
  Filled 2015-11-08 (×4): qty 2

## 2015-11-08 MED ORDER — LIDOCAINE HCL 4 % EX SOLN
Freq: Once | CUTANEOUS | Status: AC
  Administered 2015-11-08: 16:00:00 via TOPICAL
  Filled 2015-11-08: qty 50

## 2015-11-08 MED ORDER — PREGABALIN 50 MG PO CAPS
150.0000 mg | ORAL_CAPSULE | Freq: Once | ORAL | Status: AC
  Administered 2015-11-08: 150 mg via ORAL
  Filled 2015-11-08: qty 3

## 2015-11-08 MED ORDER — ONDANSETRON HCL 4 MG/2ML IJ SOLN: 4.0000 mg | Freq: Four times a day (QID) | INTRAMUSCULAR | Status: DC | PRN

## 2015-11-08 MED ORDER — ACETAMINOPHEN 650 MG RE SUPP: 650.0000 mg | Freq: Four times a day (QID) | RECTAL | Status: DC | PRN

## 2015-11-08 MED ORDER — ONDANSETRON HCL 4 MG PO TABS: 4.0000 mg | ORAL_TABLET | Freq: Four times a day (QID) | ORAL | Status: DC | PRN

## 2015-11-08 MED ORDER — LOPERAMIDE HCL 2 MG PO CAPS: 2.0000 mg | ORAL_CAPSULE | Freq: Three times a day (TID) | ORAL | Status: DC | PRN

## 2015-11-08 MED ORDER — DIPHENOXYLATE-ATROPINE 2.5-0.025 MG PO TABS
1.0000 | ORAL_TABLET | Freq: Four times a day (QID) | ORAL | Status: DC
  Administered 2015-11-09 – 2015-11-11 (×9): 1 via ORAL
  Filled 2015-11-08 (×9): qty 1

## 2015-11-08 MED ORDER — MELATONIN 10 MG PO CAPS: 20.0000 mg | ORAL_CAPSULE | Freq: Every day | ORAL | Status: DC

## 2015-11-08 MED ORDER — VITAMIN K1 10 MG/ML IJ SOLN
5.0000 mg | Freq: Once | INTRAVENOUS | Status: AC
  Administered 2015-11-08: 5 mg via INTRAVENOUS
  Filled 2015-11-08 (×2): qty 0.5

## 2015-11-08 NOTE — Consult Note (Signed)
Reason for Consult:epistaxis Referring Physician: er  Rachel White is an 65 y.o. female.  HPI: Epistaxis since this morning. This is the first episode she's had in a long time. She has had a lot of medical problems recently and states that she's been in the hospital for chronic diarrhea. She had bleeding with that as well. She really does not have a diagnosis for that problem. She has multiple other medical issues. She had a coagulation studies done on the 17th which were within normal limits. Her bleeding was controlled by the emergency room with a balloon pack. She does still have a slight intermittent bleeding but overall it is substantially decreased. Her bleeding has been entirely from the left side. She did not have any trauma. She has no breathing problems. No change in her voice. No dysphagia or odynophagia.  Past Medical History  Diagnosis Date  . Hypertension   . Neuropathic pain   . Diabetes mellitus without complication ([name])     diet controlled  . Sleep apnea     does not use C_pap  . Pneumonia     as a child  . Headache     migraines years ago  . Irritable bowel syndrome     under control  . Complication of anesthesia     some difficulty waking up after tubes tied    Past Surgical History  Procedure Laterality Date  . Gastric bypass    . Foot surgery Left   . Dilation and curettage of uterus    . Tubal ligation    . Orif tibia plateau Left 10/19/2014    Procedure: OPEN REDUCTION INTERNAL FIXATION (ORIF) LEFT BICONDYLAR TIBIAL PLATEAU;  Surgeon: Leandrew Koyanagi, MD;  Location: [facility];  Service: Orthopedics;  Laterality: Left;  . Colonoscopy N/A 10/29/2015    Procedure: COLONOSCOPY;  Surgeon: Clarene Essex, MD;  Location: [facility];  Service: Endoscopy;  Laterality: N/A;    Family History  Problem Relation Age of Onset  . Cancer Mother   . Cancer Other   . COPD Other   . Diabetes type II Neg Hx     Social History:  reports that she has been smoking Cigarettes.   She has a 41 pack-year smoking history. She has never used smokeless tobacco. She reports that she does not drink alcohol or use illicit drugs.  Allergies:  Allergies  Allergen Reactions  . Sulfa Antibiotics Other (See Comments)    Pt. Does not recall the reaction   . Trazodone Other (See Comments)    Visual Disturbances as well as Tingling sensation  . Zolpidem Tartrate Other (See Comments)    Altered mental status     Medications: I have reviewed the patient's current medications.  Results for orders placed or performed during the hospital encounter of 11/08/15 (from the past 48 hour(s))  CBC     Status: Abnormal   Collection Time: 11/08/15  2:59 PM  Result Value Ref Range   WBC 13.5 (H) 4.0 - 10.5 K/uL   RBC 3.99 3.87 - 5.11 MIL/uL   Hemoglobin 11.5 (L) 12.0 - 15.0 g/dL   HCT 34.1 (L) 36.0 - 46.0 %   MCV 85.5 78.0 - 100.0 fL   MCH 28.8 26.0 - 34.0 pg   MCHC 33.7 30.0 - 36.0 g/dL   RDW 13.3 11.5 - 15.5 %   Platelets 324 150 - 400 K/uL  I-stat chem 8, ed     Status: Abnormal   Collection Time: 11/08/15  3:13 PM  Result Value Ref Range   Sodium 142 135 - 145 mmol/L   Potassium 4.2 3.5 - 5.1 mmol/L   Chloride 107 101 - 111 mmol/L   BUN 19 6 - 20 mg/dL   Creatinine, Ser 0.90 0.44 - 1.00 mg/dL   Glucose, Bld 90 65 - 99 mg/dL   Calcium, Ion 1.10 (L) 1.13 - 1.30 mmol/L   TCO2 22 0 - 100 mmol/L   Hemoglobin 11.6 (L) 12.0 - 15.0 g/dL   HCT 34.0 (L) 36.0 - 46.0 %    No results found.  ROS Blood pressure 115/81, pulse 102, resp. rate 20, SpO2 98 %. Physical Exam  Constitutional: She appears well-developed and well-nourished.  HENT:  awakeand alert. ballloon pack in theleft side. Oc/op- clot in the nasopharyx.no active bleeding from either side. Neck soft and nomass  Eyes: Conjunctivae are normal. Pupils are equal, round, and reactive to light.  Neck: Normal range of motion. Neck supple.    Assessment/Plan: Epistaxis-we discussed the options at this point of removing  this left pack and exploring the nose and then undoubtably place a another pack. She understands this would be uncomfortable and that there is no guarantee a source of the bleeding could be found. Either way she will need to have this packing in for about 4-5 days. She is still slightly bleeding but overall it substantially improved so leaving this pack would be appropriate at this time. She does not want to go home with it bleeding at all. With her medical problems it seems more appropriate that the medical team admit her. She could go home from a otolaryngology point of view with this pack. She does it would be with Keflex for staph coverage.  Melissa Montane 11/08/2015, 5:10 PM

## 2015-11-08 NOTE — ED Notes (Signed)
MD Janace Hoard at bedside now.  RN Rica Mote aware.

## 2015-11-08 NOTE — H&P (Signed)
History and Physical    Rachel White DOB: [birth year] DOA: 11/08/2015  Referring MD/NP/PA: EDP PCP: Wonda Cerise, MD ( Outpatient Specialists: Dr.Magod Patient coming from: Home  Chief Complaint: nose bleed  HPI: Rachel White is a 65 y.o. female with past medical history of hypertension, chronic diarrhea for the past month (she was admitted to [facility] hospital for this and discharged 9 days ago, had a flexible sigmoidoscopy and biopsy by Dr. Watt Climes then) presents to the ER with sudden onset epistaxis starting at 12 noon today. She denies any trauma or injury to the nose, she denies any new medicines or changes in her chronic medications, she is not on any blood thinners, takes a baby aspirin every day. The nosebleed was very profuse, EMS was called packing was attempted and a spray was used and subsequently brought to the emergency room.   ED Course: Seen by Dr. Cathleen Fears and a balloon pack was placed in the left nostril, bleeding has improved but not subsided, subsequently ENT Dr. Janace Hoard was consulted he recommended overnight observation, he discussed repacking with the patient she declined this at the current time. Hemoglobin is 11.5, platelet count is 324K  Review of Systems: As per HPI otherwise 10 point review of systems negative, except for chronic diarrhea  Past Medical History  Diagnosis Date  . Hypertension   . Neuropathic pain   . Diabetes mellitus without complication ([name])     diet controlled  . Sleep apnea     does not use C_pap  . Pneumonia     as a child  . Headache     migraines years ago  . Irritable bowel syndrome     under control  . Complication of anesthesia     some difficulty waking up after tubes tied    Past Surgical History  Procedure Laterality Date  . Gastric bypass    . Foot surgery Left   . Dilation and curettage of uterus    . Tubal ligation    . Orif tibia plateau Left 10/19/2014    Procedure: OPEN REDUCTION  INTERNAL FIXATION (ORIF) LEFT BICONDYLAR TIBIAL PLATEAU;  Surgeon: Leandrew Koyanagi, MD;  Location: [facility];  Service: Orthopedics;  Laterality: Left;  . Colonoscopy N/A 10/29/2015    Procedure: COLONOSCOPY;  Surgeon: Clarene Essex, MD;  Location: [facility];  Service: Endoscopy;  Laterality: N/A;     reports that she has been smoking Cigarettes.  She has a 41 pack-year smoking history. She has never used smokeless tobacco. She reports that she does not drink alcohol or use illicit drugs.  Allergies  Allergen Reactions  . Sulfa Antibiotics Other (See Comments)    Pt. Does not recall the reaction   . Trazodone Other (See Comments)    Visual Disturbances as well as Tingling sensation  . Zolpidem Tartrate Other (See Comments)    Altered mental status     Family History  Problem Relation Age of Onset  . Cancer Mother   . Cancer Other   . COPD Other   . Diabetes type II Neg Hx     Prior to Admission medications   Medication Sig Start Date End Date Taking? Authorizing Provider  amLODipine (NORVASC) 5 MG tablet Take 5 mg by mouth daily.   Yes Historical Provider, MD  aspirin EC 81 MG tablet Take 81 mg by mouth daily.   Yes Historical Provider, MD  atorvastatin (LIPITOR) 40 MG tablet Take 40 mg by mouth daily. evening  Yes Historical Provider, MD  Calcium Carbonate-Vitamin D (CALCIUM-VITAMIN D) 600-125 MG-UNIT TABS Take 2 tablets by mouth daily.   Yes Historical Provider, MD  diphenoxylate-atropine (LOMOTIL) 2.5-0.025 MG tablet Take 1 tablet by mouth daily as needed for diarrhea or loose stools. 10/30/15  Yes Orson Eva, MD  LORazepam (ATIVAN) 1 MG tablet Take 1 tablet (1 mg total) by mouth at bedtime and may repeat dose one time if needed. Patient taking differently: Take 1 mg by mouth at bedtime.  08/01/12  Yes Robyn Haber, MD  Melatonin 10 MG CAPS Take 20 mg by mouth at bedtime.    Yes Historical Provider, MD  meloxicam (MOBIC) 15 MG tablet Take 15 mg by mouth daily as needed for pain.    Yes  Historical Provider, MD  pregabalin (LYRICA) 150 MG capsule Take 150 mg by mouth 2 (two) times daily. 1 pm & 7 pm   Yes Historical Provider, MD  tiZANidine (ZANAFLEX) 2 MG tablet Take 4 mg by mouth at bedtime.   Yes Historical Provider, MD  lisinopril (PRINIVIL,ZESTRIL) 10 MG tablet Take 10 mg by mouth daily. Reported on 11/08/2015 10/11/15   Historical Provider, MD    Physical Exam: Filed Vitals:   11/08/15 1401 11/08/15 1405 11/08/15 1509 11/08/15 1737  BP:  142/62 115/81 142/118  Pulse:  90 102 103  Resp:  22 20 22   SpO2: 99% 99% 98% 97%      Constitutional: NAD, calm, comfortable Filed Vitals:   11/08/15 1401 11/08/15 1405 11/08/15 1509 11/08/15 1737  BP:  142/62 115/81 142/118  Pulse:  90 102 103  Resp:  22 20 22   SpO2: 99% 99% 98% 97%   Eyes: PERRL, lids and conjunctivae normal ENMT: Packing noted in the left nostril, old dried blood around Neck: normal, supple, no masses, no thyromegaly Respiratory: clear to auscultation bilaterally, no wheezing, no crackles. Normal respiratory effort. No accessory muscle use.  Cardiovascular: Regular rate and rhythm, no murmurs / rubs / gallops. No extremity edema. 2+ pedal pulses. No carotid bruits.  Abdomen: no tenderness, no masses palpated. No hepatosplenomegaly. Bowel sounds positive.  Musculoskeletal: no clubbing / cyanosis. No joint deformity upper and lower extremities. Good ROM, no contractures. Normal muscle tone.  Skin: no rashes, lesions, ulcers. No induration Neurologic: CN 2-12 grossly intact. Sensation intact, DTR normal. Strength 5/5 in all 4.  Psychiatric: Normal judgment and insight. Alert and oriented x 3. Normal mood.   Labs on Admission: I have personally reviewed following labs and imaging studies  CBC:  Recent Labs Lab 11/02/15 0910 11/08/15 1459 11/08/15 1513  WBC 12.9* 13.5*  --   HGB 13.5 11.5* 11.6*  HCT 38.8 34.1* 34.0*  MCV 84.7 85.5  --   PLT 229 324  --    Basic Metabolic Panel:  Recent  Labs Lab 11/02/15 0910 11/08/15 1513  NA 144 142  K 3.1* 4.2  CL 109 107  CO2 25  --   GLUCOSE 89 90  BUN 20 19  CREATININE 0.99 0.90  CALCIUM 8.7*  --    GFR: Estimated Creatinine Clearance: 57 mL/min (by C-G formula based on Cr of 0.9). Liver Function Tests:  Recent Labs Lab 11/02/15 0910  AST 28  ALT 36  ALKPHOS 39  BILITOT 0.6  PROT 6.4*  ALBUMIN 3.3*   No results for input(s): LIPASE, AMYLASE in the last 168 hours. No results for input(s): AMMONIA in the last 168 hours. Coagulation Profile:  Recent Labs Lab 11/02/15 0910  INR 1.00  Cardiac Enzymes: No results for input(s): CKTOTAL, CKMB, CKMBINDEX, TROPONINI in the last 168 hours. BNP (last 3 results) No results for input(s): PROBNP in the last 8760 hours. HbA1C: No results for input(s): HGBA1C in the last 72 hours. CBG: No results for input(s): GLUCAP in the last 168 hours. Lipid Profile: No results for input(s): CHOL, HDL, LDLCALC, TRIG, CHOLHDL, LDLDIRECT in the last 72 hours. Thyroid Function Tests: No results for input(s): TSH, T4TOTAL, FREET4, T3FREE, THYROIDAB in the last 72 hours. Anemia Panel: No results for input(s): VITAMINB12, FOLATE, FERRITIN, TIBC, IRON, RETICCTPCT in the last 72 hours. Urine analysis:    Component Value Date/Time   COLORURINE YELLOW 10/26/2015 2354   APPEARANCEUR CLOUDY* 10/26/2015 2354   LABSPEC 1.022 10/26/2015 2354   PHURINE 5.5 10/26/2015 2354   GLUCOSEU NEGATIVE 10/26/2015 2354   HGBUR TRACE* 10/26/2015 2354   BILIRUBINUR SMALL* 10/26/2015 2354   BILIRUBINUR neg 03/03/2015 [place] 10/26/2015 2354   PROTEINUR 30* 10/26/2015 2354   PROTEINUR 100 03/03/2015 0954   UROBILINOGEN 2.0 03/03/2015 0954   NITRITE NEGATIVE 10/26/2015 2354   NITRITE pos 03/03/2015 0954   LEUKOCYTESUR TRACE* 10/26/2015 2354   Sepsis Labs: @LABRCNTIP (procalcitonin:4,lacticidven:4) )No results found for this or any previous visit (from the past 240 hour(s)).    Radiological Exams on Admission: No results found.  Assessment/Plan    Epistaxis -PT and PTT were normal over a week ago, platelet count normal now -I suspect her chronic diarrhea could be contributing, loss of enteric bacteria could be contributing to vitamin K deficiency and hence affecting her clotting system. -Will give vitamin K -5 mg IV now -Repeat PT and PTT -Continue current balloon packing, and plan to discharge if stable tomorrow and follow-up with Dr. Janace Hoard in 3-4 days -Appreciate ENT consult Dr. Janace Hoard will follow up in a.m. -Check CBC in a.m.    Chronic diarrhea -History of 7-10 episodes of liquid stools per day for the last month approximately -We'll schedule Lomotil and use when necessary Imodium -Flex colonoscopy on 4/11: Was unremarkable, pathology consistent with nonspecific colitis, could be microscopic/collagenous colitis is scheduled to see Dr. Watt Climes next week for this, continue supportive care for now, will likely need trial of steroids upon follow-up    Diabetes mellitus ([place]) -Diet controlled, CBG 89-90    Hypertension -Continue amlodipine  Code Status: DNR DVT Prophylaxis:SCDs Family Communication: spouse and daughter at bedside Disposition Plan: Home tomorrow if stable  Domenic Polite MD [facility] Pager [phone]  If 7PM-7AM, please contact night-coverage [email or web address] Password [email or web address]  11/08/2015, 5:43 PM

## 2015-11-08 NOTE — Care Management Obs Status (Signed)
[place] NOTIFICATION   Patient Details  Name: Rachel White MRN: [id number] Date of Birth: 05-30-1951   Medicare Observation Status Notification Given:  Jacques Navy, Verdis Koval, RN 11/08/2015, 5:31 PM

## 2015-11-08 NOTE — Progress Notes (Signed)
PHARMACIST - PHYSICIAN ORDER COMMUNICATION  CONCERNING: P&T Medication Policy on Herbal Medications  DESCRIPTION:  This patient's order for:  melatonin  has been noted.  This product(s) is classified as an "herbal" or natural product. Due to a lack of definitive safety studies or FDA approval, nonstandard manufacturing practices, plus the potential risk of unknown drug-drug interactions while on inpatient medications, the Pharmacy and Therapeutics Committee does not permit the use of "herbal" or natural products of this type within [facility].   ACTION TAKEN: The pharmacy department is unable to verify this order at this time and the order has been discontinued. Please reevaluate patient's clinical condition at discharge and address if the herbal or natural product(s) should be resumed at that time.  Royetta Asal, PharmD, BCPS Pager [phone] 11/08/2015 6:31 PM

## 2015-11-08 NOTE — ED Provider Notes (Signed)
CSN: [id number]     Arrival date & time 11/08/15  1247 History   First MD Initiated Contact with Patient 11/08/15 1331     Chief Complaint  Patient presents with  . Epistaxis     (Consider location/radiation/quality/duration/timing/severity/associated sxs/prior Treatment) HPI Patient developed nosebleed from left nostril 12 noon today was sweeping the porch. No treatment prior to coming here. No other associated symptoms. Brought by EMS. She denies any lightheadedness. No other associated symptoms nothing makes symptoms that are worse Past Medical History  Diagnosis Date  . Hypertension   . Neuropathic pain   . Diabetes mellitus without complication ([name])     diet controlled  . Sleep apnea     does not use C_pap  . Pneumonia     as a child  . Headache     migraines years ago  . Irritable bowel syndrome     under control  . Complication of anesthesia     some difficulty waking up after tubes tied   Past Surgical History  Procedure Laterality Date  . Gastric bypass    . Foot surgery Left   . Dilation and curettage of uterus    . Tubal ligation    . Orif tibia plateau Left 10/19/2014    Procedure: OPEN REDUCTION INTERNAL FIXATION (ORIF) LEFT BICONDYLAR TIBIAL PLATEAU;  Surgeon: Leandrew Koyanagi, MD;  Location: [facility];  Service: Orthopedics;  Laterality: Left;  . Colonoscopy N/A 10/29/2015    Procedure: COLONOSCOPY;  Surgeon: Clarene Essex, MD;  Location: [facility];  Service: Endoscopy;  Laterality: N/A;   Family History  Problem Relation Age of Onset  . Cancer Mother   . Cancer Other   . COPD Other   . Diabetes type II Neg Hx    Social History  Substance Use Topics  . Smoking status: Current Every Day Smoker -- 1.00 packs/day for 41 years    Types: Cigarettes  . Smokeless tobacco: Never Used  . Alcohol Use: No   OB History    No data available     Review of Systems  Constitutional: Negative.   HENT: Positive for nosebleeds.   Respiratory: Negative.   Cardiovascular:  Negative.   Gastrointestinal: Positive for diarrhea.       Chronic diarrhea  Musculoskeletal: Negative.   Skin: Negative.   Allergic/Immunologic: Positive for immunocompromised state.       Diabetic  Neurological: Negative.   Hematological: Does not bruise/bleed easily.  Psychiatric/Behavioral: Negative.   All other systems reviewed and are negative.   Results for orders placed or performed during the hospital encounter of 11/08/15  CBC  Result Value Ref Range   WBC 13.5 (H) 4.0 - 10.5 K/uL   RBC 3.99 3.87 - 5.11 MIL/uL   Hemoglobin 11.5 (L) 12.0 - 15.0 g/dL   HCT 34.1 (L) 36.0 - 46.0 %   MCV 85.5 78.0 - 100.0 fL   MCH 28.8 26.0 - 34.0 pg   MCHC 33.7 30.0 - 36.0 g/dL   RDW 13.3 11.5 - 15.5 %   Platelets 324 150 - 400 K/uL  I-stat chem 8, ed  Result Value Ref Range   Sodium 142 135 - 145 mmol/L   Potassium 4.2 3.5 - 5.1 mmol/L   Chloride 107 101 - 111 mmol/L   BUN 19 6 - 20 mg/dL   Creatinine, Ser 0.90 0.44 - 1.00 mg/dL   Glucose, Bld 90 65 - 99 mg/dL   Calcium, Ion 1.10 (L) 1.13 - 1.30 mmol/L  TCO2 22 0 - 100 mmol/L   Hemoglobin 11.6 (L) 12.0 - 15.0 g/dL   HCT 34.0 (L) 36.0 - 46.0 %   No results found.   Allergies  Sulfa antibiotics; Trazodone; and Zolpidem tartrate  Home Medications   Prior to Admission medications   Medication Sig Start Date End Date Taking? Authorizing Provider  amLODipine (NORVASC) 5 MG tablet Take 5 mg by mouth daily.    Historical Provider, MD  aspirin EC 81 MG tablet Take 81 mg by mouth daily.    Historical Provider, MD  atorvastatin (LIPITOR) 40 MG tablet Take 40 mg by mouth daily. evening    Historical Provider, MD  Calcium Carbonate-Vitamin D (CALCIUM-VITAMIN D) 600-125 MG-UNIT TABS Take 2 tablets by mouth daily.    Historical Provider, MD  diphenoxylate-atropine (LOMOTIL) 2.5-0.025 MG tablet Take 1 tablet by mouth daily as needed for diarrhea or loose stools. 10/30/15   Orson Eva, MD  lisinopril (PRINIVIL,ZESTRIL) 10 MG tablet Take 10  mg by mouth daily. 10/11/15   Historical Provider, MD  LORazepam (ATIVAN) 1 MG tablet Take 1 tablet (1 mg total) by mouth at bedtime and may repeat dose one time if needed. Patient taking differently: Take 1 mg by mouth at bedtime.  08/01/12   Robyn Haber, MD  Melatonin 10 MG CAPS Take 20 mg by mouth at bedtime.     Historical Provider, MD  meloxicam (MOBIC) 15 MG tablet Take 15 mg by mouth daily as needed for pain.     Historical Provider, MD  potassium chloride SA (K-DUR,KLOR-CON) 20 MEQ tablet Take 2 tablets (40 mEq total) by mouth daily. X 2 days Patient not taking: Reported on 11/02/2015 10/30/15   Orson Eva, MD  pregabalin (LYRICA) 150 MG capsule Take 150 mg by mouth 2 (two) times daily. 1 pm & 7 pm    Historical Provider, MD  tiZANidine (ZANAFLEX) 2 MG tablet Take 4 mg by mouth at bedtime.    Historical Provider, MD   BP 161/75 mmHg  Pulse 109  Resp 18  SpO2 99% Physical Exam  Constitutional: She appears well-developed and well-nourished.  HENT:  Head: Normocephalic and atraumatic.  Actively bleeding from left nostril  Eyes: Conjunctivae are normal. Pupils are equal, round, and reactive to light.  Neck: Neck supple. No tracheal deviation present. No thyromegaly present.  Cardiovascular: Normal rate and regular rhythm.   No murmur heard. Pulmonary/Chest: Effort normal and breath sounds normal.  Abdominal: Soft. Bowel sounds are normal. She exhibits no distension. There is no tenderness.  Musculoskeletal: Normal range of motion. She exhibits no edema or tenderness.  Neurological: She is alert. Coordination normal.  Skin: Skin is warm and dry. No rash noted.  Psychiatric: She has a normal mood and affect.  Nursing note and vitals reviewed.   ED Course  .Epistaxis Management Date/Time: 11/08/2015 2:07 PM Performed by: Orlie Dakin Authorized by: Orlie Dakin Consent: The procedure was performed in an emergent situation. Verbal consent obtained. Written consent not  obtained. Risks and benefits: risks, benefits and alternatives were discussed Consent given by: patient Patient understanding: patient states understanding of the procedure being performed Patient consent: the patient's understanding of the procedure matches consent given Procedure consent: procedure consent matches procedure scheduled Site marked: the operative site was not marked Required items: required blood products, implants, devices, and special equipment available Patient identity confirmed: verbally with patient Time out: Immediately prior to procedure a "time out" was called to verify the correct patient, procedure, equipment, support staff and  site/side marked as required. Patient sedated: no Treatment site: left anterior Repair method: suction and nasal balloon Post-procedure assessment: bleeding stopped Treatment complexity: complex Patient tolerance: Patient tolerated the procedure well with no immediate complications Comments: No bleeding site visualized. Bleeding proximal to keisselbach's plexus   (including critical care time) Labs Review Labs Reviewed - No data to display  Imaging Review No results found. I have personally reviewed and evaluated these images and lab results as part of my medical decision-making.   EKG Interpretation None     2:25 PM patient's nosebleed had restarted. She was bleeding through the packing. Dr. Janace Hoard from ENT was consulted At 5:15 PM no active bleeding MDM  Dr. Janace Hoard requested overnight observation to hospitalist service. I consulted Dr. Broadus John who will see patient in hospital Final diagnoses:  None   Diagnosis epistaxis     Orlie Dakin, MD 11/08/15 1719

## 2015-11-08 NOTE — ED Notes (Signed)
Pt was sweeping porch and blood started shooting out of both nares.  Pt denies pain, blood thinner and injury.  NAD VSS.

## 2015-11-09 DIAGNOSIS — Z882 Allergy status to sulfonamides status: Secondary | ICD-10-CM | POA: Diagnosis not present

## 2015-11-09 DIAGNOSIS — K529 Noninfective gastroenteritis and colitis, unspecified: Secondary | ICD-10-CM | POA: Diagnosis present

## 2015-11-09 DIAGNOSIS — I10 Essential (primary) hypertension: Secondary | ICD-10-CM | POA: Diagnosis present

## 2015-11-09 DIAGNOSIS — G4730 Sleep apnea, unspecified: Secondary | ICD-10-CM | POA: Diagnosis present

## 2015-11-09 DIAGNOSIS — R040 Epistaxis: Secondary | ICD-10-CM | POA: Diagnosis present

## 2015-11-09 DIAGNOSIS — E561 Deficiency of vitamin K: Secondary | ICD-10-CM | POA: Diagnosis present

## 2015-11-09 DIAGNOSIS — F17210 Nicotine dependence, cigarettes, uncomplicated: Secondary | ICD-10-CM | POA: Diagnosis present

## 2015-11-09 DIAGNOSIS — D62 Acute posthemorrhagic anemia: Secondary | ICD-10-CM | POA: Diagnosis present

## 2015-11-09 DIAGNOSIS — E1142 Type 2 diabetes mellitus with diabetic polyneuropathy: Secondary | ICD-10-CM | POA: Diagnosis present

## 2015-11-09 DIAGNOSIS — Z66 Do not resuscitate: Secondary | ICD-10-CM | POA: Diagnosis present

## 2015-11-09 DIAGNOSIS — Z79899 Other long term (current) drug therapy: Secondary | ICD-10-CM | POA: Diagnosis not present

## 2015-11-09 DIAGNOSIS — Z888 Allergy status to other drugs, medicaments and biological substances status: Secondary | ICD-10-CM | POA: Diagnosis not present

## 2015-11-09 DIAGNOSIS — Z7982 Long term (current) use of aspirin: Secondary | ICD-10-CM | POA: Diagnosis not present

## 2015-11-09 DIAGNOSIS — D899 Disorder involving the immune mechanism, unspecified: Secondary | ICD-10-CM | POA: Diagnosis present

## 2015-11-09 LAB — CBC
HCT: 27.4 % — ABNORMAL LOW (ref 36.0–46.0)
HCT: 29.5 % — ABNORMAL LOW (ref 36.0–46.0)
HEMATOCRIT: 26.5 % — AB (ref 36.0–46.0)
HEMOGLOBIN: 9.4 g/dL — AB (ref 12.0–15.0)
Hemoglobin: 10 g/dL — ABNORMAL LOW (ref 12.0–15.0)
Hemoglobin: 8.7 g/dL — ABNORMAL LOW (ref 12.0–15.0)
MCH: 29.8 pg (ref 26.0–34.0)
MCH: 29.1 pg (ref 26.0–34.0)
MCH: 29.2 pg (ref 26.0–34.0)
MCHC: 34.3 g/dL (ref 30.0–36.0)
MCHC: 33.9 g/dL (ref 30.0–36.0)
MCHC: 32.8 g/dL (ref 30.0–36.0)
MCV: 87.8 fL (ref 78.0–100.0)
MCV: 88.6 fL (ref 78.0–100.0)
MCV: 85.1 fL (ref 78.0–100.0)
PLATELETS: 315 10*3/uL (ref 150–400)
Platelets: 267 10*3/uL (ref 150–400)
Platelets: 306 10*3/uL (ref 150–400)
RBC: 2.99 MIL/uL — ABNORMAL LOW (ref 3.87–5.11)
RBC: 3.36 MIL/uL — AB (ref 3.87–5.11)
RBC: 3.22 MIL/uL — AB (ref 3.87–5.11)
RDW: 13.3 % (ref 11.5–15.5)
RDW: 13.7 % (ref 11.5–15.5)
RDW: 13.7 % (ref 11.5–15.5)
WBC: 12.6 10*3/uL — ABNORMAL HIGH (ref 4.0–10.5)
WBC: 14.2 10*3/uL — AB (ref 4.0–10.5)
WBC: 11.5 10*3/uL — AB (ref 4.0–10.5)

## 2015-11-09 LAB — BASIC METABOLIC PANEL
ANION GAP: 9 (ref 5–15)
BUN: 26 mg/dL — ABNORMAL HIGH (ref 6–20)
CALCIUM: 8.6 mg/dL — AB (ref 8.9–10.3)
CHLORIDE: 108 mmol/L (ref 101–111)
CO2: 26 mmol/L (ref 22–32)
Creatinine, Ser: 0.93 mg/dL (ref 0.44–1.00)
GFR calc non Af Amer: >60 mL/min (ref >60)
Glucose, Bld: 100 mg/dL — ABNORMAL HIGH (ref 65–99)
Potassium: 4.7 mmol/L (ref 3.5–5.1)
SODIUM: 143 mmol/L (ref 135–145)

## 2015-11-09 MED ORDER — OXYMETAZOLINE HCL 0.05 % NA SOLN
3.0000 | NASAL | Status: DC
  Filled 2015-11-09: qty 15

## 2015-11-09 MED ORDER — LIDOCAINE HCL 4 % EX SOLN
0.0000 mL | Freq: Once | CUTANEOUS | Status: DC | PRN
  Filled 2015-11-09: qty 50

## 2015-11-09 MED ORDER — LIDOCAINE HCL 2 % EX GEL
1.0000 "application " | Freq: Once | CUTANEOUS | Status: DC | PRN
  Filled 2015-11-09: qty 5

## 2015-11-09 MED ORDER — TRIPLE ANTIBIOTIC 3.5-400-5000 EX OINT
1.0000 "application " | TOPICAL_OINTMENT | Freq: Once | CUTANEOUS | Status: DC | PRN
  Filled 2015-11-09: qty 1

## 2015-11-09 MED ORDER — SILVER NITRATE-POT NITRATE 75-25 % EX MISC
1.0000 | Freq: Once | CUTANEOUS | Status: DC | PRN
  Filled 2015-11-09: qty 1

## 2015-11-09 MED ORDER — OXYMETAZOLINE HCL 0.05 % NA SOLN
1.0000 | Freq: Once | NASAL | Status: DC | PRN
  Filled 2015-11-09: qty 15

## 2015-11-09 MED ORDER — OXYMETAZOLINE HCL 0.05 % NA SOLN: 1.0000 | NASAL | Status: DC

## 2015-11-09 MED ORDER — LIDOCAINE-EPINEPHRINE (PF) 1 %-1:200000 IJ SOLN
0.0000 mL | Freq: Once | INTRAMUSCULAR | Status: DC | PRN
  Filled 2015-11-09: qty 30

## 2015-11-09 NOTE — Progress Notes (Signed)
Ms Rachel White was noted to have some bright red blood coming form her nose and mouth, pt has left nares nasal packing that appears to have bloody drainage noted. Notified the on Walden Field NP, CBC order given, give vitamin K, as previously order, we continue to monitor

## 2015-11-09 NOTE — Progress Notes (Signed)
Rachel White had another episode of nose bleed, packing in placed,blood continue to drip form the right nares and mouth notified the on call, Walden Field NP, called placed to ENT by Walden Field NP. Pt is stable at this time, will continue to monitor.

## 2015-11-09 NOTE — Progress Notes (Signed)
PROGRESS NOTE    Rachel White  [id number] DOB: 10-26-1950 DOA: 11/08/2015 PCP: Wonda Cerise, MD Outpatient Specialists: Norva Pavlov Brief Narrative:Rachel White is a 65 y.o. female with past medical history of hypertension, chronic diarrhea for the past month (she was admitted to [facility] for this and discharged 9 days ago, had a flexible sigmoidoscopy and biopsy by Dr. Watt Climes then) presents to the ER with sudden onset epistaxis starting at 12 noon  4/21 Recurrent Bleeding: repacked by Dr.Byers 4/22  Assessment & Plan: Severe recurrent Epistaxis -packed in ER yesterday evening -I suspect her chronic diarrhea could be contributing, loss of enteric bacteria could be contributing to vitamin K deficiency and hence affecting her clotting system. -Given vitamin K -5 mg IV , PTand PTT are normal -severe bleeding recurred again this am, and emergently repacked today by Dr.Byers -will need to go to OR if this recurs  Acute Blood loss anemia -Hb dropped from 13.5 to 10 due to above -monitor Hb, may need transfusion if trends down further  Blood in stools -suspect due to epistaxis and swallowing blood -colonoscopy was normal <10days ago -monitor    Chronic diarrhea -History of 7-10 episodes of liquid stools per day for the last month approximately -continue Lomotil and use when necessary Imodium -Flex colonoscopy on 4/11: Was unremarkable, pathology consistent with nonspecific colitis, could be microscopic/collagenous colitis is scheduled to see Dr. Watt Climes next week for this, continue supportive care for now, will likely need trial of steroids upon follow-up   Diabetes mellitus ([place]) -Diet controlled, CBG 89-90   Hypertension -Continue amlodipine    DVT prophylaxis: SCDs Code Status: DNR Family Communication:spouse  Disposition Plan:Home when bleeding subsides  Consultants:   ENT Dr.Byers  Procedures: Nose packing 4/22 Nose packing  4/21 Antimicrobials:  Subjective: Some blood in stools, repacked this am due to recurrent epistaxis  Objective: Filed Vitals:   11/08/15 1509 11/08/15 1842 11/08/15 1946 11/09/15 0635  BP: 115/81 120/64 138/78 133/59  Pulse: 102 87 114 83  Temp:  97.8 F (36.6 C) 97.8 F (36.6 C) 98 F (36.7 C)  TempSrc:  Oral Oral Oral  Resp: 20 20 19 19   SpO2: 98% 100% 99% 98%    Intake/Output Summary (Last 24 hours) at 11/09/15 1018 Last data filed at 11/09/15 0839  Gross per 24 hour  Intake    240 ml  Output      0 ml  Net    240 ml   There were no vitals filed for this visit.  Examination:  General exam: Appears calm and comfortable  HEENT: L nostril with packing Respiratory system: Clear to auscultation. Respiratory effort normal. Cardiovascular system: S1 & S2 heard, RRR. No JVD, murmurs, rubs, gallops or clicks. No pedal edema. Gastrointestinal system: Abdomen is nondistended, soft and nontender. No organomegaly or masses felt. Normal bowel sounds heard. Central nervous system: Alert and oriented. No focal neurological deficits. Extremities: Symmetric 5 x 5 power. Skin: No rashes, lesions or ulcers Psychiatry: Judgement and insight appear normal. Mood & affect appropriate.     Data Reviewed: I have personally reviewed following labs and imaging studies  CBC:  Recent Labs Lab 11/08/15 1459 11/08/15 1513 11/09/15 0044 11/09/15 0527  WBC 13.5*  --  11.5* 14.2*  HGB 11.5* 11.6* 9.4* 10.0*  HCT 34.1* 34.0* 27.4* 29.5*  MCV 85.5  --  85.1 87.8  PLT 324  --  267 [id number]   Basic Metabolic Panel:  Recent Labs Lab 11/08/15 1513  11/09/15 0527  NA 142 143  K 4.2 4.7  CL 107 108  CO2  --  26  GLUCOSE 90 100*  BUN 19 26*  CREATININE 0.90 0.93  CALCIUM  --  8.6*   GFR: Estimated Creatinine Clearance: 55.1 mL/min (by C-G formula based on Cr of 0.93). Liver Function Tests: No results for input(s): AST, ALT, ALKPHOS, BILITOT, PROT, ALBUMIN in the last 168 hours. No  results for input(s): LIPASE, AMYLASE in the last 168 hours. No results for input(s): AMMONIA in the last 168 hours. Coagulation Profile:  Recent Labs Lab 11/08/15 1838  INR 1.12   Cardiac Enzymes: No results for input(s): CKTOTAL, CKMB, CKMBINDEX, TROPONINI in the last 168 hours. BNP (last 3 results) No results for input(s): PROBNP in the last 8760 hours. HbA1C: No results for input(s): HGBA1C in the last 72 hours. CBG: No results for input(s): GLUCAP in the last 168 hours. Lipid Profile: No results for input(s): CHOL, HDL, LDLCALC, TRIG, CHOLHDL, LDLDIRECT in the last 72 hours. Thyroid Function Tests: No results for input(s): TSH, T4TOTAL, FREET4, T3FREE, THYROIDAB in the last 72 hours. Anemia Panel: No results for input(s): VITAMINB12, FOLATE, FERRITIN, TIBC, IRON, RETICCTPCT in the last 72 hours. Urine analysis:    Component Value Date/Time   COLORURINE YELLOW 10/26/2015 2354   APPEARANCEUR CLOUDY* 10/26/2015 2354   LABSPEC 1.022 10/26/2015 2354   PHURINE 5.5 10/26/2015 2354   GLUCOSEU NEGATIVE 10/26/2015 2354   HGBUR TRACE* 10/26/2015 2354   BILIRUBINUR SMALL* 10/26/2015 2354   BILIRUBINUR neg 03/03/2015 [place] 10/26/2015 2354   PROTEINUR 30* 10/26/2015 2354   PROTEINUR 100 03/03/2015 0954   UROBILINOGEN 2.0 03/03/2015 0954   NITRITE NEGATIVE 10/26/2015 2354   NITRITE pos 03/03/2015 0954   LEUKOCYTESUR TRACE* 10/26/2015 2354   Sepsis Labs: @LABRCNTIP (procalcitonin:4,lacticidven:4)  )No results found for this or any previous visit (from the past 240 hour(s)).       Radiology Studies: No results found.      Scheduled Meds: . amLODipine  5 mg Oral Daily  . atorvastatin  40 mg Oral Daily  . calcium-vitamin D  2 tablet Oral Daily  . diphenoxylate-atropine  1 tablet Oral QID  . pregabalin  150 mg Oral BID  . silver nitrate applicators  1 Stick Topical Once  . tiZANidine  4 mg Oral QHS   Continuous Infusions:    LOS: 0 days     Time spent: 63min    Domenic Polite, MD [facility] Pager [phone]  If 7PM-7AM, please contact night-coverage [email or web address] Password [email or web address] 11/09/2015, 10:18 AM

## 2015-11-09 NOTE — Progress Notes (Signed)
  Subjective: Had 2 episodes of bleeding last night and one was around 2:30 or 3 AM. She currently is not bleeding. She c/o of bright red blood per rectum  Objective: Vital signs in last 24 hours: Temp:  [97.8 F (36.6 C)-98 F (36.7 C)] 98 F (36.7 C) (04/22 0635) Pulse Rate:  [83-114] 83 (04/22 0635) Resp:  [18-22] 19 (04/22 0635) BP: (115-161)/(59-81) 133/59 mmHg (04/22 0635) SpO2:  [98 %-100 %] 98 % (04/22 0635) Last BM Date: 11/08/15  Intake/Output from previous day:   Intake/Output this shift:    awake alert. has clot in the NP. no active bleeding from the nose.  Lab Results:   Recent Labs  11/09/15 0044 11/09/15 0527  WBC 11.5* 14.2*  HGB 9.4* 10.0*  HCT 27.4* 29.5*  PLT 267 315   BMET  Recent Labs  11/08/15 1513 11/09/15 0527  NA 142 143  K 4.2 4.7  CL 107 108  CO2  --  26  GLUCOSE 90 100*  BUN 19 26*  CREATININE 0.90 0.93  CALCIUM  --  8.6*   PT/INR  Recent Labs  11/08/15 1838  LABPROT 14.2  INR 1.12   ABG No results for input(s): PHART, HCO3 in the last 72 hours.  Invalid input(s): PCO2, PO2  Studies/Results: No results found.  Anti-infectives: Anti-infectives    None      Assessment/Plan: s/p * No surgery found * I was called at about 3 am and she had another bleeding. I requested all the epistaxis equipment and made mention of the COMBINED ENT TRAY 4 times making it CLEAR that is what had to be ordered. I followed up at 430 or so because it is ALWAYS is a unbelievable struggle to get equipment at this hospital. As usuall the equipment that had been brought up was wrong. they brought a simple epistaxis tray from the ER and no headlight. I then at 5 AM requested again the proper equipment and they called me at 6 AM that they had the it ready. when I arrived the headlight was broken so then more people were mobilized to get light from OR. SHe has had continued bleeding so the left balloon pack was removed and she immediately had  significant bleeding from what appeared to be superior nose. Afrin lidocaine pledgetts were placed which did not stop it. Multiple were placed to try to get her comfortable.  A merocel with abx was placed superior then a long merocel with abx  placed inferior. strings taped to face. She seemed to stop bleeding. will now keep this pack in for 5 days. She should be on ancef or keflex. If she bleeds again she will need to go to OR.      Melissa Montane 11/09/2015

## 2015-11-10 DIAGNOSIS — D62 Acute posthemorrhagic anemia: Secondary | ICD-10-CM | POA: Diagnosis present

## 2015-11-10 LAB — CBC
HCT: 27.2 % — ABNORMAL LOW (ref 36.0–46.0)
HCT: 27.7 % — ABNORMAL LOW (ref 36.0–46.0)
HEMOGLOBIN: 8.9 g/dL — AB (ref 12.0–15.0)
HEMOGLOBIN: 9.4 g/dL — AB (ref 12.0–15.0)
MCH: 28.3 pg (ref 26.0–34.0)
MCH: 29.7 pg (ref 26.0–34.0)
MCHC: 32.7 g/dL (ref 30.0–36.0)
MCHC: 33.9 g/dL (ref 30.0–36.0)
MCV: 86.3 fL (ref 78.0–100.0)
MCV: 87.7 fL (ref 78.0–100.0)
PLATELETS: 339 10*3/uL (ref 150–400)
Platelets: 287 10*3/uL (ref 150–400)
RBC: 3.15 MIL/uL — AB (ref 3.87–5.11)
RBC: 3.16 MIL/uL — AB (ref 3.87–5.11)
RDW: 13.6 % (ref 11.5–15.5)
RDW: 13.9 % (ref 11.5–15.5)
WBC: 15.7 10*3/uL — AB (ref 4.0–10.5)
WBC: 11.4 10*3/uL — AB (ref 4.0–10.5)

## 2015-11-10 LAB — BASIC METABOLIC PANEL
Anion gap: 8 (ref 5–15)
BUN: 30 mg/dL — AB (ref 6–20)
CALCIUM: 8.6 mg/dL — AB (ref 8.9–10.3)
CHLORIDE: 112 mmol/L — AB (ref 101–111)
CO2: 27 mmol/L (ref 22–32)
Creatinine, Ser: 1.03 mg/dL — ABNORMAL HIGH (ref 0.44–1.00)
GFR, EST NON AFRICAN AMERICAN: 56 mL/min — AB (ref >60)
Glucose, Bld: 102 mg/dL — ABNORMAL HIGH (ref 65–99)
POTASSIUM: 4.5 mmol/L (ref 3.5–5.1)
SODIUM: 147 mmol/L — AB (ref 135–145)

## 2015-11-10 LAB — PREPARE RBC (CROSSMATCH)

## 2015-11-10 MED ORDER — SODIUM CHLORIDE 0.9 % IV SOLN
Freq: Once | INTRAVENOUS | Status: AC
Start: 2015-11-10 — End: 2015-11-10
  Administered 2015-11-10: 14:00:00 via INTRAVENOUS

## 2015-11-10 NOTE — Progress Notes (Signed)
PROGRESS NOTE    Rachel White  [id number] DOB: 26-Jun-1951 DOA: 11/08/2015 PCP: Wonda Cerise, MD Outpatient Specialists: Norva Pavlov Brief Narrative:Rachel White is a 65 y.o. female with past medical history of hypertension, chronic diarrhea for the past month (she was admitted to [facility] for this and discharged 9 days ago, had a flexible sigmoidoscopy and biopsy by Dr. Watt Climes then) presents to the ER with sudden onset epistaxis starting at 12 noon  4/21 Recurrent Bleeding: repacked by Dr.Byers 4/22  Assessment & Plan: Severe recurrent Epistaxis -packed in ER 4/21 evening -I suspect her chronic diarrhea could be contributing, loss of enteric bacteria could be contributing to vitamin K deficiency and hence affecting her clotting system. -Given vitamin K -5 mg IV , PTand PTT are normal -severe bleeding recurred again 4/22 am, and emergently repacked by Dr.Byers on 4/22 -will need to go to OR if this recurs -no further bleeding so far, still having modest amount of blood in stools from swallowed blood  Acute Blood loss anemia -Hb dropped from 13.5 to 8.9 due to above -monitor Hb, transfuse 1 unit PRBC  Blood in stools -suspect due to epistaxis and swallowing blood -colonoscopy was normal <10days ago -monitor    Chronic diarrhea -History of 7-10 episodes of liquid stools per day for the last month approximately -continue Lomotil and use when necessary Imodium -Flex colonoscopy on 4/11: Was unremarkable, pathology consistent with nonspecific colitis, could be microscopic/collagenous colitis is scheduled to see Dr. Watt Climes next week for this, continue supportive care for now, will likely need trial of steroids upon follow-up   Diabetes mellitus ([place]) -Diet controlled, CBG 89-90   Hypertension -Continue amlodipine   DVT prophylaxis:SCDs Code Status: DNR Family Communication:spouse  Disposition Plan:Home tomorrow if bleeding improving  Consultants:    ENT Dr.Byers  Procedures: Nose packing 4/22 Nose packing 4/21 Antimicrobials:  Subjective: Some blood in stools last pm too and overnight, no further epistaxis  Objective: Filed Vitals:   11/09/15 0635 11/09/15 1239 11/09/15 2125 11/10/15 0614  BP: 133/59 119/67 134/62 129/68  Pulse: 83 87 86 80  Temp: 98 F (36.7 C) 98.8 F (37.1 C) 99.2 F (37.3 C) 98.7 F (37.1 C)  TempSrc: Oral Oral Oral Oral  Resp: 19 17 18 18   SpO2: 98% 98% 99% 100%    Intake/Output Summary (Last 24 hours) at 11/10/15 1005 Last data filed at 11/10/15 0615  Gross per 24 hour  Intake    240 ml  Output      0 ml  Net    240 ml   There were no vitals filed for this visit.  Examination:  General exam: Appears calm and comfortable  HEENT: L nostril with packing Respiratory system: Clear to auscultation. Respiratory effort normal. Cardiovascular system: S1 & S2 heard, RRR. No JVD, murmurs, rubs, gallops or clicks. No pedal edema. Gastrointestinal system: Abdomen is nondistended, soft and nontender. No organomegaly or masses felt. Normal bowel sounds heard. Central nervous system: Alert and oriented. No focal neurological deficits. Extremities: Symmetric 5 x 5 power. Skin: No rashes, lesions or ulcers Psychiatry: Judgement and insight appear normal. Mood & affect appropriate.     Data Reviewed: I have personally reviewed following labs and imaging studies  CBC:  Recent Labs Lab 11/08/15 1459 11/08/15 1513 11/09/15 0044 11/09/15 0527 11/09/15 1658 11/10/15 0548  WBC 13.5*  --  11.5* 14.2* 12.6* 15.7*  HGB 11.5* 11.6* 9.4* 10.0* 8.7* 8.9*  HCT 34.1* 34.0* 27.4* 29.5* 26.5* 27.2*  MCV 85.5  --  85.1 87.8 88.6 86.3  PLT 324  --  267 315 306 [id number]   Basic Metabolic Panel:  Recent Labs Lab 11/08/15 1513 11/09/15 0527 11/10/15 0548  NA 142 143 147*  K 4.2 4.7 4.5  CL 107 108 112*  CO2  --  26 27  GLUCOSE 90 100* 102*  BUN 19 26* 30*  CREATININE 0.90 0.93 1.03*  CALCIUM  --  8.6*  8.6*   GFR: Estimated Creatinine Clearance: 49.8 mL/min (by C-G formula based on Cr of 1.03). Liver Function Tests: No results for input(s): AST, ALT, ALKPHOS, BILITOT, PROT, ALBUMIN in the last 168 hours. No results for input(s): LIPASE, AMYLASE in the last 168 hours. No results for input(s): AMMONIA in the last 168 hours. Coagulation Profile:  Recent Labs Lab 11/08/15 1838  INR 1.12   Cardiac Enzymes: No results for input(s): CKTOTAL, CKMB, CKMBINDEX, TROPONINI in the last 168 hours. BNP (last 3 results) No results for input(s): PROBNP in the last 8760 hours. HbA1C: No results for input(s): HGBA1C in the last 72 hours. CBG: No results for input(s): GLUCAP in the last 168 hours. Lipid Profile: No results for input(s): CHOL, HDL, LDLCALC, TRIG, CHOLHDL, LDLDIRECT in the last 72 hours. Thyroid Function Tests: No results for input(s): TSH, T4TOTAL, FREET4, T3FREE, THYROIDAB in the last 72 hours. Anemia Panel: No results for input(s): VITAMINB12, FOLATE, FERRITIN, TIBC, IRON, RETICCTPCT in the last 72 hours. Urine analysis:    Component Value Date/Time   COLORURINE YELLOW 10/26/2015 2354   APPEARANCEUR CLOUDY* 10/26/2015 2354   LABSPEC 1.022 10/26/2015 2354   PHURINE 5.5 10/26/2015 2354   GLUCOSEU NEGATIVE 10/26/2015 2354   HGBUR TRACE* 10/26/2015 2354   BILIRUBINUR SMALL* 10/26/2015 2354   BILIRUBINUR neg 03/03/2015 [place] 10/26/2015 2354   PROTEINUR 30* 10/26/2015 2354   PROTEINUR 100 03/03/2015 0954   UROBILINOGEN 2.0 03/03/2015 0954   NITRITE NEGATIVE 10/26/2015 2354   NITRITE pos 03/03/2015 0954   LEUKOCYTESUR TRACE* 10/26/2015 2354   Sepsis Labs: @LABRCNTIP (procalcitonin:4,lacticidven:4)  )No results found for this or any previous visit (from the past 240 hour(s)).       Radiology Studies: No results found.      Scheduled Meds: . sodium chloride   Intravenous Once  . amLODipine  5 mg Oral Daily  . atorvastatin  40 mg Oral Daily    . calcium-vitamin D  2 tablet Oral Daily  . diphenoxylate-atropine  1 tablet Oral QID  . pregabalin  150 mg Oral BID  . silver nitrate applicators  1 Stick Topical Once  . tiZANidine  4 mg Oral QHS   Continuous Infusions:    LOS: 1 day    Time spent: 41min    Domenic Polite, MD [facility] Pager [phone]  If 7PM-7AM, please contact night-coverage [email or web address] Password [facility] 11/10/2015, 10:05 AM

## 2015-11-11 DIAGNOSIS — D62 Acute posthemorrhagic anemia: Secondary | ICD-10-CM

## 2015-11-11 LAB — TYPE AND SCREEN
ABO/RH(D): A POS
Antibody Screen: NEGATIVE
UNIT DIVISION: 0

## 2015-11-11 LAB — CBC
HCT: 27.8 % — ABNORMAL LOW (ref 36.0–46.0)
HEMOGLOBIN: 9.3 g/dL — AB (ref 12.0–15.0)
MCH: 29.4 pg (ref 26.0–34.0)
MCHC: 33.5 g/dL (ref 30.0–36.0)
MCV: 88 fL (ref 78.0–100.0)
PLATELETS: 278 10*3/uL (ref 150–400)
RBC: 3.16 MIL/uL — AB (ref 3.87–5.11)
RDW: 14 % (ref 11.5–15.5)
WBC: 10 10*3/uL (ref 4.0–10.5)

## 2015-11-11 LAB — BASIC METABOLIC PANEL
Anion gap: 6 (ref 5–15)
BUN: 22 mg/dL — AB (ref 6–20)
CO2: 29 mmol/L (ref 22–32)
CREATININE: 0.89 mg/dL (ref 0.44–1.00)
Calcium: 8.5 mg/dL — ABNORMAL LOW (ref 8.9–10.3)
Chloride: 109 mmol/L (ref 101–111)
Glucose, Bld: 84 mg/dL (ref 65–99)
POTASSIUM: 3.7 mmol/L (ref 3.5–5.1)
SODIUM: 144 mmol/L (ref 135–145)

## 2015-11-11 LAB — ABO/RH: ABO/RH(D): A POS

## 2015-11-11 MED ORDER — DIPHENOXYLATE-ATROPINE 2.5-0.025 MG PO TABS: 1.0000 | ORAL_TABLET | Freq: Four times a day (QID) | ORAL | Status: DC

## 2015-11-11 MED ORDER — CEPHALEXIN 250 MG PO CAPS: 250.0000 mg | ORAL_CAPSULE | Freq: Two times a day (BID) | ORAL | Status: DC

## 2015-11-11 MED ORDER — LORATADINE 10 MG PO TABS
10.0000 mg | ORAL_TABLET | Freq: Every day | ORAL | Status: DC
  Administered 2015-11-11: 10 mg via ORAL
  Filled 2015-11-11: qty 1

## 2015-11-11 MED ORDER — LOPERAMIDE HCL 2 MG PO CAPS: 2.0000 mg | ORAL_CAPSULE | Freq: Three times a day (TID) | ORAL | Status: DC | PRN

## 2015-11-11 NOTE — Discharge Summary (Signed)
Physician Discharge Summary  Rachel White [id number] DOB: [birth year] DOA: 11/08/2015  PCP: Wonda Cerise, MD  Admit date: 11/08/2015 Discharge date: 11/11/2015  Time spent: 45 minutes  Recommendations for Outpatient Follow-up:  1. Dr.Byers, ENT on Thursday 4/27 to remove nasal packing 2. Gi Dr.Magod, keep appt on Friday 4/28  Discharge Diagnoses:    Severe Epistaxis   Acute Blood loss anemia   Peripheral neuropathy ([name])   Chronic diarrhea   Diabetes mellitus ([place])   Epistaxis   Hypertension   Discharge Condition: stable  Diet recommendation: DIabetic  There were no vitals filed for this visit.  History of present illness:  Rachel White is a 65 y.o. female with past medical history of hypertension, chronic diarrhea for the past month (she was admitted to [facility] for this and discharged 9 days ago, had a flexible sigmoidoscopy and biopsy by Dr. Watt Climes then) presents to the ER with sudden onset epistaxis starting at 12 noon today. She denies any trauma or injury to the nose, she denies any new medicines or changes in her chronic medications, she is not on any blood thinners, takes a baby aspirin every day. The nosebleed was very profuse, EMS was called packing was attempted and a spray was used and subsequently brought to the emergency room.   ED Course: Seen by Dr. Cathleen Fears and a balloon pack was placed in the left nostril, bleeding has improved but not subsided  Hospital Course:  Severe recurrent Epistaxis -packed in ER 4/21 evening -I suspect her chronic diarrhea could be contributing, loss of enteric bacteria could be contributing to vitamin K deficiency and hence affecting her clotting system. -Given vitamin K -5 mg IV , PTand PTT are normal -severe bleeding recurred again 4/22 am, and emergently repacked by Dr.Byers on 4/22 -no further bleeding since then, required 1 unit PRBC this admission, was having some amount of blood in stools from  swallowed blood which has subsided today  Acute Blood loss anemia -Hb dropped from 13.5 to 8.9 due to above -transfused 1 unit PRBC -Hb is 9.3 today  Blood in stools -suspect due to epistaxis and swallowing blood -colonoscopy was normal <10days ago -had 2 normal bowel movements without any blood today   Chronic diarrhea -History of 7-10 episodes of liquid stools per day for the last month approximately -continue Lomotil QID and use when necessary Imodium -Flex colonoscopy on 4/11: Was unremarkable, pathology consistent with nonspecific colitis, could be microscopic/collagenous colitis is scheduled to see Dr. Watt Climes on Friday for this, continue anti-motility agents for now, will likely need trial of steroids upon follow-up   Diabetes mellitus ([place]) -Diet controlled, CBG 89-90   Hypertension -Continue amlodipine, stopped lisinopril, since BP soft and low normal range  Code Status: DNR  Procedures: Nose packing 4/22 by Dr.Byers Nose packing 4/21 by EDP  Consultations:  ENt Dr.Byers  Discharge Exam: Filed Vitals:   11/10/15 2156 11/11/15 0450  BP: 138/69 116/70  Pulse: 73 76  Temp: 98.9 F (37.2 C) 97.8 F (36.6 C)  Resp: 18 18    General: AAOx3 Cardiovascular:S1S2/RRR Respiratory: CTAB  Discharge Instructions   Discharge Instructions    Diet - low sodium heart healthy    Complete by:  As directed      Increase activity slowly    Complete by:  As directed           Discharge Medication List as of 11/11/2015 10:33 AM    START taking these medications  Details  cephALEXin (KEFLEX) 250 MG capsule Take 1 capsule (250 mg total) by mouth 2 (two) times daily. For 2days, for nasal packing to prevent infection, Starting 11/11/2015, Until Discontinued, Print    loperamide (IMODIUM) 2 MG capsule Take 1 capsule (2 mg total) by mouth 3 (three) times daily as needed for diarrhea or loose stools., Starting 11/11/2015, Until Discontinued, No Print      CONTINUE these  medications which have CHANGED   Details  diphenoxylate-atropine (LOMOTIL) 2.5-0.025 MG tablet Take 1 tablet by mouth 4 (four) times daily., Starting 11/11/2015, Until Discontinued, Print      CONTINUE these medications which have NOT CHANGED   Details  amLODipine (NORVASC) 5 MG tablet Take 5 mg by mouth daily., Until Discontinued, Historical Med    atorvastatin (LIPITOR) 40 MG tablet Take 40 mg by mouth daily. evening, Until Discontinued, Historical Med    Calcium Carbonate-Vitamin D (CALCIUM-VITAMIN D) 600-125 MG-UNIT TABS Take 2 tablets by mouth daily., Until Discontinued, Historical Med    LORazepam (ATIVAN) 1 MG tablet Take 1 tablet (1 mg total) by mouth at bedtime and may repeat dose one time if needed., Starting 08/01/2012, Until Discontinued, Print    Melatonin 10 MG CAPS Take 20 mg by mouth at bedtime. , Until Discontinued, Historical Med    pregabalin (LYRICA) 150 MG capsule Take 150 mg by mouth 2 (two) times daily. 1 pm & 7 pm, Until Discontinued, Historical Med    tiZANidine (ZANAFLEX) 2 MG tablet Take 4 mg by mouth at bedtime., Until Discontinued, Historical Med      STOP taking these medications     aspirin EC 81 MG tablet      meloxicam (MOBIC) 15 MG tablet      lisinopril (PRINIVIL,ZESTRIL) 10 MG tablet        Allergies  Allergen Reactions  . Sulfa Antibiotics Other (See Comments)    Pt. Does not recall the reaction   . Trazodone Other (See Comments)    Visual Disturbances as well as Tingling sensation  . Zolpidem Tartrate Other (See Comments)    Altered mental status    Follow-up Information    Follow up with Melissa Montane, MD On 11/14/2015.   Specialty:  Otolaryngology   Why:  Please call office for FU, for packing removal   Contact information:   [place] [address] [phone]       Follow up with [facility], MD On 11/15/2015.   Specialty:  Gastroenterology   Why:  Keep Appt on Friday   Contact information:   [address] [place] [address] [phone]        The results of significant diagnostics from this hospitalization (including imaging, microbiology, ancillary and laboratory) are listed below for reference.    Significant Diagnostic Studies: No results found.  Microbiology: No results found for this or any previous visit (from the past 240 hour(s)).   Labs: Basic Metabolic Panel:  Recent Labs Lab 11/08/15 1513 11/09/15 0527 11/10/15 0548 11/11/15 0518  NA 142 143 147* 144  K 4.2 4.7 4.5 3.7  CL 107 108 112* 109  CO2  --  26 27 29   GLUCOSE 90 100* 102* 84  BUN 19 26* 30* 22*  CREATININE 0.90 0.93 1.03* 0.89  CALCIUM  --  8.6* 8.6* 8.5*   Liver Function Tests: No results for input(s): AST, ALT, ALKPHOS, BILITOT, PROT, ALBUMIN in the last 168 hours. No results for input(s): LIPASE,  AMYLASE in the last 168 hours. No results for input(s): AMMONIA in the last 168 hours. CBC:  Recent Labs Lab 11/09/15 0527 11/09/15 1658 11/10/15 0548 11/10/15 1913 11/11/15 0518  WBC 14.2* 12.6* 15.7* 11.4* 10.0  HGB 10.0* 8.7* 8.9* 9.4* 9.3*  HCT 29.5* 26.5* 27.2* 27.7* 27.8*  MCV 87.8 88.6 86.3 87.7 88.0  PLT 315 306 339 287 278   Cardiac Enzymes: No results for input(s): CKTOTAL, CKMB, CKMBINDEX, TROPONINI in the last 168 hours. BNP: BNP (last 3 results) No results for input(s): BNP in the last 8760 hours.  ProBNP (last 3 results) No results for input(s): PROBNP in the last 8760 hours.  CBG: No results for input(s): GLUCAP in the last 168 hours.     SignedDomenic Polite MD.  [facility] 11/11/2015, 1:24 PM .trh

## 2015-11-11 NOTE — Progress Notes (Addendum)
Pt VSS. IVs removed. Formed BM no blood. AVS reviewed at bedside, prescriptions provided, no further questions. Kizzie Ide, RN

## 2015-11-11 NOTE — Progress Notes (Signed)
Pt had stool, bright red blood not in stool but floating in toilet. Hgb to be rechecked in am. Hoyle Barr

## 2016-03-20 ENCOUNTER — Other Ambulatory Visit: Payer: Self-pay | Admitting: Gastroenterology

## 2016-03-20 DIAGNOSIS — R197 Diarrhea, unspecified: Secondary | ICD-10-CM

## 2016-03-31 ENCOUNTER — Ambulatory Visit
Admission: RE | Admit: 2016-03-31 | Discharge: 2016-03-31 | Disposition: A | Discharge status: Home / Self Care | Payer: Medicare Other | Source: Ambulatory Visit | Attending: Gastroenterology | Admitting: Gastroenterology

## 2016-03-31 DIAGNOSIS — R197 Diarrhea, unspecified: Secondary | ICD-10-CM

## 2016-03-31 MED ORDER — IOPAMIDOL (ISOVUE-300) INJECTION 61%
100.0000 mL | Freq: Once | INTRAVENOUS | Status: AC | PRN
  Administered 2016-03-31: 100 mL via INTRAVENOUS

## 2016-05-05 ENCOUNTER — Emergency Department (HOSPITAL_COMMUNITY)
Admission: EM | Admit: 2016-05-05 | Discharge: 2016-05-05 | Disposition: A | Discharge status: Home / Self Care | Payer: Medicare Other | Attending: Emergency Medicine | Admitting: Emergency Medicine

## 2016-05-05 ENCOUNTER — Encounter (HOSPITAL_COMMUNITY): Payer: Self-pay | Admitting: *Deleted

## 2016-05-05 DIAGNOSIS — N390 Urinary tract infection, site not specified: Secondary | ICD-10-CM

## 2016-05-05 DIAGNOSIS — R350 Frequency of micturition: Secondary | ICD-10-CM | POA: Diagnosis present

## 2016-05-05 DIAGNOSIS — Z79899 Other long term (current) drug therapy: Secondary | ICD-10-CM | POA: Diagnosis not present

## 2016-05-05 DIAGNOSIS — E119 Type 2 diabetes mellitus without complications: Secondary | ICD-10-CM | POA: Insufficient documentation

## 2016-05-05 DIAGNOSIS — F17210 Nicotine dependence, cigarettes, uncomplicated: Secondary | ICD-10-CM | POA: Insufficient documentation

## 2016-05-05 DIAGNOSIS — I10 Essential (primary) hypertension: Secondary | ICD-10-CM | POA: Diagnosis not present

## 2016-05-05 LAB — CBC
HEMATOCRIT: 42.1 % (ref 36.0–46.0)
HEMOGLOBIN: 14.2 g/dL (ref 12.0–15.0)
MCH: 27.4 pg (ref 26.0–34.0)
MCHC: 33.7 g/dL (ref 30.0–36.0)
MCV: 81.1 fL (ref 78.0–100.0)
Platelets: 218 10*3/uL (ref 150–400)
RBC: 5.19 MIL/uL — ABNORMAL HIGH (ref 3.87–5.11)
RDW: 16.7 % — ABNORMAL HIGH (ref 11.5–15.5)
WBC: 11.8 10*3/uL — AB (ref 4.0–10.5)

## 2016-05-05 LAB — BASIC METABOLIC PANEL
ANION GAP: 8 (ref 5–15)
BUN: 15 mg/dL (ref 6–20)
CALCIUM: 9.2 mg/dL (ref 8.9–10.3)
CO2: 26 mmol/L (ref 22–32)
Chloride: 105 mmol/L (ref 101–111)
Creatinine, Ser: 0.89 mg/dL (ref 0.44–1.00)
GLUCOSE: 98 mg/dL (ref 65–99)
POTASSIUM: 3.5 mmol/L (ref 3.5–5.1)
SODIUM: 139 mmol/L (ref 135–145)

## 2016-05-05 LAB — URINALYSIS, ROUTINE W REFLEX MICROSCOPIC
Bilirubin Urine: NEGATIVE
GLUCOSE, UA: NEGATIVE mg/dL
KETONES UR: NEGATIVE mg/dL
NITRITE: NEGATIVE
PROTEIN: 30 mg/dL — AB
Specific Gravity, Urine: 1.01 (ref 1.005–1.030)
pH: 6 (ref 5.0–8.0)

## 2016-05-05 LAB — URINE MICROSCOPIC-ADD ON

## 2016-05-05 MED ORDER — CIPROFLOXACIN HCL 500 MG PO TABS: 500.0000 mg | ORAL_TABLET | Freq: Two times a day (BID) | ORAL | 0 refills | Status: DC

## 2016-05-05 MED ORDER — PHENAZOPYRIDINE HCL 200 MG PO TABS: 200.0000 mg | ORAL_TABLET | Freq: Three times a day (TID) | ORAL | 0 refills | Status: DC

## 2016-05-05 MED ORDER — PHENAZOPYRIDINE HCL 100 MG PO TABS
100.0000 mg | ORAL_TABLET | Freq: Once | ORAL | Status: AC
  Administered 2016-05-05: 100 mg via ORAL
  Filled 2016-05-05: qty 1

## 2016-05-05 MED ORDER — HYDROCODONE-ACETAMINOPHEN 5-325 MG PO TABS
1.0000 | ORAL_TABLET | Freq: Once | ORAL | Status: AC
  Administered 2016-05-05: 1 via ORAL
  Filled 2016-05-05: qty 1

## 2016-05-05 MED ORDER — CIPROFLOXACIN HCL 500 MG PO TABS
500.0000 mg | ORAL_TABLET | Freq: Once | ORAL | Status: AC
  Administered 2016-05-05: 500 mg via ORAL
  Filled 2016-05-05: qty 1

## 2016-05-05 NOTE — ED Provider Notes (Signed)
[place] DEPT Provider Note   CSN: [id number] Arrival date & time: 05/05/16  0735     History   Chief Complaint Chief Complaint  Patient presents with  . Urinary Frequency    HPI Rachel White is a 65 y.o. female.  She presents for evaluation of dysuria. She has a history of mild chronic stress incontinence. She states she has had 3 urinary tract infection since July. The first 2 were treated with Anaprox and resolved. She started symptoms he had approximately 7 days ago. She was at [facility]. She states she had a urine culture and what was negative. She states she was told about the results of same day. However, she is fairly certain that she was told it was a negative culture(?).  Her symptoms are classic she has burning with urination and frequency. Her incontinence is worse. She states she feels exactly like the previous time she has had urinary tract infections. She has some poorly localized left-sided back pain. No nausea or vomiting. No fevers or chills. No gross hematuria.  HPI  Past Medical History:  Diagnosis Date  . Complication of anesthesia    some difficulty waking up after tubes tied  . Diabetes mellitus without complication ([name])    diet controlled  . Headache    migraines years ago  . Hypertension   . Irritable bowel syndrome    under control  . Neuropathic pain   . Pneumonia    as a child  . Sleep apnea    does not use C_pap    Patient Active Problem List   Diagnosis Date Noted  . Acute blood loss anemia 11/10/2015  . Severe epistaxis 11/09/2015  . Epistaxis 11/08/2015  . Dehydration 10/26/2015  . Acute renal failure (ARF) ([place]) 10/26/2015  . Hypotension 10/26/2015  . Hypokalemia 10/26/2015  . Diabetes mellitus ([place]) 10/26/2015  . Sleep apnea 10/26/2015  . Diarrhea 10/26/2015  . Fracture of left tibial plateau 10/19/2014  . Tibial plateau fracture 10/19/2014  . Peripheral neuropathy ([place]) 08/01/2012  . Insomnia  08/01/2012  . Chronic diarrhea 08/01/2012    Past Surgical History:  Procedure Laterality Date  . COLONOSCOPY N/A 10/29/2015   Procedure: COLONOSCOPY;  Surgeon: Clarene Essex, MD;  Location: [facility];  Service: Endoscopy;  Laterality: N/A;  . DILATION AND CURETTAGE OF UTERUS    . FOOT SURGERY Left   . GASTRIC BYPASS    . ORIF TIBIA PLATEAU Left 10/19/2014   Procedure: OPEN REDUCTION INTERNAL FIXATION (ORIF) LEFT BICONDYLAR TIBIAL PLATEAU;  Surgeon: Leandrew Koyanagi, MD;  Location: [facility];  Service: Orthopedics;  Laterality: Left;  . TUBAL LIGATION      OB History    No data available       Home Medications    Prior to Admission medications   Medication Sig Start Date End Date Taking? Authorizing Provider  amLODipine (NORVASC) 5 MG tablet Take 5 mg by mouth every morning.    Yes Historical Provider, MD  atorvastatin (LIPITOR) 10 MG tablet Take 10 mg by mouth every evening.   Yes Historical Provider, MD  Calcium Carbonate-Vitamin D (CALCIUM-VITAMIN D) 600-125 MG-UNIT TABS Take 1 tablet by mouth 2 (two) times daily.    Yes Historical Provider, MD  lisinopril (PRINIVIL,ZESTRIL) 10 MG tablet Take 10 mg by mouth every morning.   Yes Historical Provider, MD  LORazepam (ATIVAN) 1 MG tablet Take 1 tablet (1 mg total) by mouth at bedtime and may repeat dose one time if needed.  Patient taking differently: Take 1 mg by mouth at bedtime.  08/01/12  Yes Robyn Haber, MD  Melatonin 10 MG CAPS Take 20 mg by mouth at bedtime.    Yes Historical Provider, MD  pregabalin (LYRICA) 75 MG capsule Take 150 mg by mouth 2 (two) times daily.   Yes Historical Provider, MD  Probiotic Product (ALIGN PO) Take 1 capsule by mouth every other day.   Yes Historical Provider, MD  tiZANidine (ZANAFLEX) 2 MG tablet Take 4 mg by mouth at bedtime.   Yes Historical Provider, MD  cephALEXin (KEFLEX) 250 MG capsule Take 1 capsule (250 mg total) by mouth 2 (two) times daily. For 2days, for nasal packing to prevent  infection Patient not taking: Reported on 05/05/2016 11/11/15   Domenic Polite, MD  ciprofloxacin (CIPRO) 500 MG tablet Take 1 tablet (500 mg total) by mouth every 12 (twelve) hours. 05/05/16   Tanna Furry, MD  diphenoxylate-atropine (LOMOTIL) 2.5-0.025 MG tablet Take 1 tablet by mouth 4 (four) times daily. Patient not taking: Reported on 05/05/2016 11/11/15   Domenic Polite, MD  loperamide (IMODIUM) 2 MG capsule Take 1 capsule (2 mg total) by mouth 3 (three) times daily as needed for diarrhea or loose stools. Patient not taking: Reported on 05/05/2016 11/11/15   Domenic Polite, MD  phenazopyridine (PYRIDIUM) 200 MG tablet Take 1 tablet (200 mg total) by mouth 3 (three) times daily. 05/05/16   Tanna Furry, MD    Family History Family History  Problem Relation Age of Onset  . Cancer Mother   . Cancer Other   . COPD Other   . Diabetes type II Neg Hx     Social History Social History  Substance Use Topics  . Smoking status: Current Every Day Smoker    Packs/day: 1.00    Years: 41.00    Types: Cigarettes  . Smokeless tobacco: Never Used  . Alcohol use No     Allergies   Sulfa antibiotics; Trazodone; and Zolpidem tartrate   Review of Systems Review of Systems  Constitutional: Negative for appetite change, chills, diaphoresis, fatigue and fever.  HENT: Negative for mouth sores, sore throat and trouble swallowing.   Eyes: Negative for visual disturbance.  Respiratory: Negative for cough, chest tightness, shortness of breath and wheezing.   Cardiovascular: Negative for chest pain.  Gastrointestinal: Negative for abdominal distention, abdominal pain, diarrhea, nausea and vomiting.  Endocrine: Negative for polydipsia, polyphagia and polyuria.  Genitourinary: Positive for difficulty urinating, dysuria and urgency. Negative for frequency and hematuria.  Musculoskeletal: Negative for gait problem.  Skin: Negative for color change, pallor and rash.  Neurological: Negative for dizziness,  syncope, light-headedness and headaches.  Hematological: Does not bruise/bleed easily.  Psychiatric/Behavioral: Negative for behavioral problems and confusion.     Physical Exam Updated Vital Signs BP 144/75 (BP Location: Right Arm)   Pulse 81   Temp 98.2 F (36.8 C) (Oral)   Resp 17   Ht 5' 3.25" (1.607 m)   Wt 155 lb (70.3 kg)   SpO2 100%   BMI 27.24 kg/m   Physical Exam  Constitutional: She is oriented to person, place, and time. She appears well-developed and well-nourished. No distress.  HENT:  Head: Normocephalic.  Eyes: Conjunctivae are normal. Pupils are equal, round, and reactive to light. No scleral icterus.  Neck: Normal range of motion. Neck supple. No thyromegaly present.  Cardiovascular: Normal rate and regular rhythm.  Exam reveals no gallop and no friction rub.   No murmur heard. Pulmonary/Chest: Effort normal  and breath sounds normal. No respiratory distress. She has no wheezes. She has no rales.  Abdominal: Soft. Bowel sounds are normal. She exhibits no distension. There is no tenderness. There is no rebound.    Musculoskeletal: Normal range of motion.  Neurological: She is alert and oriented to person, place, and time.  Skin: Skin is warm and dry. No rash noted.  Psychiatric: She has a normal mood and affect. Her behavior is normal.     ED Treatments / Results  Labs (all labs ordered are listed, but only abnormal results are displayed) Labs Reviewed  URINALYSIS, ROUTINE W REFLEX MICROSCOPIC (NOT AT [facility]) - Abnormal; Notable for the following:       Result Value   APPearance TURBID (*)    Hgb urine dipstick LARGE (*)    Protein, ur 30 (*)    Leukocytes, UA LARGE (*)    All other components within normal limits  CBC - Abnormal; Notable for the following:    WBC 11.8 (*)    RBC 5.19 (*)    RDW 16.7 (*)    All other components within normal limits  URINE MICROSCOPIC-ADD ON - Abnormal; Notable for the following:    Squamous Epithelial / LPF 0-5 (*)     Bacteria, UA FEW (*)    All other components within normal limits  BASIC METABOLIC PANEL    EKG  EKG Interpretation None       Radiology No results found.  Procedures Procedures (including critical care time)  Medications Ordered in ED Medications  HYDROcodone-acetaminophen (NORCO/VICODIN) 5-325 MG per tablet 1 tablet (not administered)  phenazopyridine (PYRIDIUM) tablet 100 mg (not administered)  ciprofloxacin (CIPRO) tablet 500 mg (not administered)     Initial Impression / Assessment and Plan / ED Course  I have reviewed the triage vital signs and the nursing notes.  Pertinent labs & imaging results that were available during my care of the patient were reviewed by me and considered in my medical decision making (see chart for details).  Clinical Course    UA would suggest urinary tract infection. 2 no worst count white blood cells and bacteria. Culture requested and pending. Treated with antibiotics and Pyridium. Primary care follow-up for culture results.  Final Clinical Impressions(s) / ED Diagnoses   Final diagnoses:  Lower urinary tract infectious disease    New Prescriptions New Prescriptions   CIPROFLOXACIN (CIPRO) 500 MG TABLET    Take 1 tablet (500 mg total) by mouth every 12 (twelve) hours.   PHENAZOPYRIDINE (PYRIDIUM) 200 MG TABLET    Take 1 tablet (200 mg total) by mouth 3 (three) times daily.     Tanna Furry, MD 05/05/16 1007

## 2016-05-05 NOTE — ED Triage Notes (Addendum)
Patient c/o urinary frequency and incontinence for "years," but it has been increasingly worse over past few months.  Patient saw [facility] last week and he checked her bladder and urethra and "said it was fine."  Patient has a renal US scheduled for next week.  Patient states she presents today because she has "severe" low back pain and "can't control her urine."  Again, these are not new s/s, but they have gotten worse since last week. Patient states she had a urine culture last week at [facility] and it was negative for any particular organism.

## 2016-05-28 ENCOUNTER — Telehealth (INDEPENDENT_AMBULATORY_CARE_PROVIDER_SITE_OTHER): Payer: Self-pay | Admitting: Orthopaedic Surgery

## 2016-05-28 NOTE — Telephone Encounter (Signed)
Patient left a voice mail to discuss scheduling surgery.  I called her back and left her a voice mail to return my call to schedule surgery.

## 2016-06-03 NOTE — Telephone Encounter (Signed)
I called patient back and scheduled surgery.

## 2016-06-12 ENCOUNTER — Encounter (HOSPITAL_COMMUNITY): Payer: Self-pay

## 2016-06-12 ENCOUNTER — Emergency Department (HOSPITAL_COMMUNITY)
Admission: EM | Admit: 2016-06-12 | Discharge: 2016-06-12 | Disposition: A | Discharge status: Home / Self Care | Payer: Medicare Other | Attending: Emergency Medicine | Admitting: Emergency Medicine

## 2016-06-12 DIAGNOSIS — H81399 Other peripheral vertigo, unspecified ear: Secondary | ICD-10-CM | POA: Insufficient documentation

## 2016-06-12 DIAGNOSIS — F17210 Nicotine dependence, cigarettes, uncomplicated: Secondary | ICD-10-CM | POA: Insufficient documentation

## 2016-06-12 DIAGNOSIS — Z79899 Other long term (current) drug therapy: Secondary | ICD-10-CM | POA: Insufficient documentation

## 2016-06-12 DIAGNOSIS — I10 Essential (primary) hypertension: Secondary | ICD-10-CM | POA: Insufficient documentation

## 2016-06-12 DIAGNOSIS — E1140 Type 2 diabetes mellitus with diabetic neuropathy, unspecified: Secondary | ICD-10-CM | POA: Diagnosis not present

## 2016-06-12 DIAGNOSIS — R42 Dizziness and giddiness: Secondary | ICD-10-CM | POA: Diagnosis present

## 2016-06-12 LAB — DIFFERENTIAL
BASOS ABS: 0 10*3/uL (ref 0.0–0.1)
BASOS PCT: 0 %
EOS ABS: 0.2 10*3/uL (ref 0.0–0.7)
Eosinophils Relative: 2 %
Lymphocytes Relative: 30 %
Lymphs Abs: 2.8 10*3/uL (ref 0.7–4.0)
Monocytes Absolute: 0.4 10*3/uL (ref 0.1–1.0)
Monocytes Relative: 5 %
NEUTROS ABS: 5.9 10*3/uL (ref 1.7–7.7)
NEUTROS PCT: 63 %

## 2016-06-12 LAB — COMPREHENSIVE METABOLIC PANEL
ALT: 23 U/L (ref 14–54)
AST: 32 U/L (ref 15–41)
Albumin: 4.5 g/dL (ref 3.5–5.0)
Alkaline Phosphatase: 52 U/L (ref 38–126)
Anion gap: 6 (ref 5–15)
BILIRUBIN TOTAL: 1 mg/dL (ref 0.3–1.2)
BUN: 22 mg/dL — ABNORMAL HIGH (ref 6–20)
CALCIUM: 9.3 mg/dL (ref 8.9–10.3)
CHLORIDE: 106 mmol/L (ref 101–111)
CO2: 26 mmol/L (ref 22–32)
CREATININE: 1.01 mg/dL — AB (ref 0.44–1.00)
GFR, EST NON AFRICAN AMERICAN: 57 mL/min — AB (ref >60)
Glucose, Bld: 100 mg/dL — ABNORMAL HIGH (ref 65–99)
Potassium: 5.4 mmol/L — ABNORMAL HIGH (ref 3.5–5.1)
Sodium: 138 mmol/L (ref 135–145)
TOTAL PROTEIN: 7.7 g/dL (ref 6.5–8.1)

## 2016-06-12 LAB — CBC
HCT: 45.6 % (ref 36.0–46.0)
Hemoglobin: 15.1 g/dL — ABNORMAL HIGH (ref 12.0–15.0)
MCH: 28.5 pg (ref 26.0–34.0)
MCHC: 33.1 g/dL (ref 30.0–36.0)
MCV: 86.2 fL (ref 78.0–100.0)
PLATELETS: 170 10*3/uL (ref 150–400)
RBC: 5.29 MIL/uL — AB (ref 3.87–5.11)
RDW: 15.3 % (ref 11.5–15.5)
WBC: 9.3 10*3/uL (ref 4.0–10.5)

## 2016-06-12 LAB — CBG MONITORING, ED: GLUCOSE-CAPILLARY: 110 mg/dL — AB (ref 65–99)

## 2016-06-12 MED ORDER — ONDANSETRON HCL 4 MG PO TABS: 4.0000 mg | ORAL_TABLET | Freq: Four times a day (QID) | ORAL | 0 refills | Status: DC | PRN

## 2016-06-12 MED ORDER — MECLIZINE HCL 25 MG PO TABS: 25.0000 mg | ORAL_TABLET | Freq: Three times a day (TID) | ORAL | 0 refills | Status: DC | PRN

## 2016-06-12 MED ORDER — ONDANSETRON HCL 4 MG/2ML IJ SOLN
4.0000 mg | Freq: Once | INTRAMUSCULAR | Status: AC
  Administered 2016-06-12: 4 mg via INTRAVENOUS
  Filled 2016-06-12: qty 2

## 2016-06-12 MED ORDER — MECLIZINE HCL 25 MG PO TABS
25.0000 mg | ORAL_TABLET | Freq: Once | ORAL | Status: AC
  Administered 2016-06-12: 25 mg via ORAL
  Filled 2016-06-12: qty 1

## 2016-06-12 NOTE — ED Triage Notes (Signed)
Pt complains of being dizzy and being confused that woke her up this evening, she also states that she's really thirsty

## 2016-06-12 NOTE — ED Provider Notes (Signed)
[facility] DEPT Provider Note   CSN: [id number] Arrival date & time: 06/12/16  0257  By signing my name below, I, Emmanuella Mensah, attest that this documentation has been prepared under the direction and in the presence of Delora Fuel, MD. Electronically Signed: Judithann Sauger, ED Scribe. 06/12/16. 3:43 AM.   History   Chief Complaint Chief Complaint  Patient presents with  . Dizziness  . Altered Mental Status    HPI Comments: Rachel White is a 65 y.o. female with a hx of DM, hypertension, and neuropathic pain who presents to the Emergency Department complaining of sudden onset, persistent episode of dizziness onset several hours ago when she woke up. She reports associated nausea and intermittent right leg cramps with swelling. She notes that her symptoms are worse when she lays down flat. Pt denies any recent falls, injuries, or trauma. No alleviating factors noted. Pt has not tried any medications PTA. She has an allergy to Sulfa antibiotics, Trazadone, and Zolpidem Tartrate. She denies any fever, ear pain, hearing loss, tinnitus, vomiting, blood in stool, syncope, or any other symptoms.   PCP: Dr. Jeralene Huff   The history is provided by the patient. No language interpreter was used.  Dizziness  Quality:  Vertigo Severity:  Moderate Onset quality:  Sudden Timing:  Constant Progression:  Worsening Chronicity:  New Context: head movement   Context: not with ear pain and not with loss of consciousness   Relieved by:  None tried Worsened by:  Nothing Ineffective treatments:  None tried Associated symptoms: nausea   Associated symptoms: no blood in stool, no hearing loss, no syncope, no tinnitus and no vomiting   Risk factors: no hx of vertigo     Past Medical History:  Diagnosis Date  . Complication of anesthesia    some difficulty waking up after tubes tied  . Diabetes mellitus without complication ([name])    diet controlled  . Headache    migraines years ago    . Hypertension   . Irritable bowel syndrome    under control  . Neuropathic pain   . Pneumonia    as a child  . Sleep apnea    does not use C_pap    Patient Active Problem List   Diagnosis Date Noted  . Acute blood loss anemia 11/10/2015  . Severe epistaxis 11/09/2015  . Epistaxis 11/08/2015  . Dehydration 10/26/2015  . Acute renal failure (ARF) ([place]) 10/26/2015  . Hypotension 10/26/2015  . Hypokalemia 10/26/2015  . Diabetes mellitus ([place]) 10/26/2015  . Sleep apnea 10/26/2015  . Diarrhea 10/26/2015  . Fracture of left tibial plateau 10/19/2014  . Tibial plateau fracture 10/19/2014  . Peripheral neuropathy ([place]) 08/01/2012  . Insomnia 08/01/2012  . Chronic diarrhea 08/01/2012    Past Surgical History:  Procedure Laterality Date  . COLONOSCOPY N/A 10/29/2015   Procedure: COLONOSCOPY;  Surgeon: Clarene Essex, MD;  Location: [facility];  Service: Endoscopy;  Laterality: N/A;  . DILATION AND CURETTAGE OF UTERUS    . FOOT SURGERY Left   . GASTRIC BYPASS    . ORIF TIBIA PLATEAU Left 10/19/2014   Procedure: OPEN REDUCTION INTERNAL FIXATION (ORIF) LEFT BICONDYLAR TIBIAL PLATEAU;  Surgeon: Leandrew Koyanagi, MD;  Location: [facility];  Service: Orthopedics;  Laterality: Left;  . TUBAL LIGATION      OB History    No data available       Home Medications    Prior to Admission medications   Medication Sig Start Date End Date Taking? Authorizing  Provider  amLODipine (NORVASC) 5 MG tablet Take 5 mg by mouth every morning.     Historical Provider, MD  atorvastatin (LIPITOR) 10 MG tablet Take 10 mg by mouth every evening.    Historical Provider, MD  Calcium Carbonate-Vitamin D (CALCIUM-VITAMIN D) 600-125 MG-UNIT TABS Take 1 tablet by mouth 2 (two) times daily.     Historical Provider, MD  cephALEXin (KEFLEX) 250 MG capsule Take 1 capsule (250 mg total) by mouth 2 (two) times daily. For 2days, for nasal packing to prevent infection Patient not taking: Reported on 05/05/2016 11/11/15   Domenic Polite, MD  ciprofloxacin (CIPRO) 500 MG tablet Take 1 tablet (500 mg total) by mouth every 12 (twelve) hours. 05/05/16   Tanna Furry, MD  diphenoxylate-atropine (LOMOTIL) 2.5-0.025 MG tablet Take 1 tablet by mouth 4 (four) times daily. Patient not taking: Reported on 05/05/2016 11/11/15   Domenic Polite, MD  lisinopril (PRINIVIL,ZESTRIL) 10 MG tablet Take 10 mg by mouth every morning.    Historical Provider, MD  loperamide (IMODIUM) 2 MG capsule Take 1 capsule (2 mg total) by mouth 3 (three) times daily as needed for diarrhea or loose stools. Patient not taking: Reported on 05/05/2016 11/11/15   Domenic Polite, MD  LORazepam (ATIVAN) 1 MG tablet Take 1 tablet (1 mg total) by mouth at bedtime and may repeat dose one time if needed. Patient taking differently: Take 1 mg by mouth at bedtime.  08/01/12   Robyn Haber, MD  Melatonin 10 MG CAPS Take 20 mg by mouth at bedtime.     Historical Provider, MD  phenazopyridine (PYRIDIUM) 200 MG tablet Take 1 tablet (200 mg total) by mouth 3 (three) times daily. 05/05/16   Tanna Furry, MD  pregabalin (LYRICA) 75 MG capsule Take 150 mg by mouth 2 (two) times daily.    Historical Provider, MD  Probiotic Product (ALIGN PO) Take 1 capsule by mouth every other day.    Historical Provider, MD  tiZANidine (ZANAFLEX) 2 MG tablet Take 4 mg by mouth at bedtime.    Historical Provider, MD    Family History Family History  Problem Relation Age of Onset  . Cancer Mother   . Cancer Other   . COPD Other   . Diabetes type II Neg Hx     Social History Social History  Substance Use Topics  . Smoking status: Current Every Day Smoker    Packs/day: 1.00    Years: 41.00    Types: Cigarettes  . Smokeless tobacco: Never Used  . Alcohol use No     Allergies   Sulfa antibiotics; Trazodone; and Zolpidem tartrate   Review of Systems Review of Systems  Constitutional: Negative for fever.  HENT: Negative for ear pain, hearing loss and tinnitus.   Cardiovascular:  Positive for leg swelling. Negative for syncope.  Gastrointestinal: Positive for nausea. Negative for blood in stool and vomiting.  Neurological: Positive for dizziness. Negative for syncope.     Physical Exam Updated Vital Signs BP (!) 155/103 (BP Location: Left Arm)   Pulse 65   Temp 97.7 F (36.5 C) (Oral)   Resp 19   SpO2 98%   Physical Exam  Constitutional: She is oriented to person, place, and time. She appears well-developed and well-nourished.  HENT:  Head: Normocephalic and atraumatic.  Right Ear: Tympanic membrane normal.  Left Ear: Tympanic membrane normal.  Eyes: EOM are normal. Pupils are equal, round, and reactive to light.  No nystagmus   Neck: Normal range of motion. Neck  supple. No JVD present.  No carotid bruits  Cardiovascular: Normal rate, regular rhythm and normal heart sounds.   No murmur heard. Pulmonary/Chest: Effort normal and breath sounds normal. She has no wheezes. She has no rales. She exhibits no tenderness.  Abdominal: Soft. Bowel sounds are normal. She exhibits no distension and no mass. There is no tenderness.  Musculoskeletal: Normal range of motion. She exhibits no edema.  Right calf has circumference 1 cm greater than left calf but no pitting edema; no tenderness; no warmth; negative Horman's sign   Lymphadenopathy:    She has no cervical adenopathy.  Neurological: She is alert and oriented to person, place, and time. No cranial nerve deficit. She exhibits normal muscle tone. Coordination normal.  Normal finger to nose test; dizziness reproduced by passive head movement   Skin: Skin is warm and dry. No rash noted.  Psychiatric: She has a normal mood and affect. Her behavior is normal. Judgment and thought content normal.  Nursing note and vitals reviewed.    ED Treatments / Results  DIAGNOSTIC STUDIES: Oxygen Saturation is 98% on RA, normal by my interpretation.    COORDINATION OF CARE: 3:41 AM- Pt advised of plan for treatment and pt  agrees.    Labs (all labs ordered are listed, but only abnormal results are displayed) Labs Reviewed  COMPREHENSIVE METABOLIC PANEL - Abnormal; Notable for the following:       Result Value   Potassium 5.4 (*)    Glucose, Bld 100 (*)    BUN 22 (*)    Creatinine, Ser 1.01 (*)    GFR calc non Af Amer 57 (*)    All other components within normal limits  CBC - Abnormal; Notable for the following:    RBC 5.29 (*)    Hemoglobin 15.1 (*)    All other components within normal limits  CBG MONITORING, ED - Abnormal; Notable for the following:    Glucose-Capillary 110 (*)    All other components within normal limits  DIFFERENTIAL    EKG  EKG Interpretation  Date/Time:  Friday June 12 2016 03:34:03 EST Ventricular Rate:  61 PR Interval:    QRS Duration: 118 QT Interval:  491 QTC Calculation: 495 R Axis:   11 Text Interpretation:  Sinus rhythm Prominent P waves, nondiagnostic Incomplete left bundle branch block Low voltage, extremity and precordial leads Artifact When compared with ECG of 10/19/2014, No significant change was found Confirmed by [facility]  MD, Kinzee Happel ([id number]) on 06/12/2016 5:35:56 AM      Procedures Procedures (including critical care time)  Medications Ordered in ED Medications  ondansetron (ZOFRAN) injection 4 mg (4 mg Intravenous Given 06/12/16 0359)  meclizine (ANTIVERT) tablet 25 mg (25 mg Oral Given 06/12/16 0358)     Initial Impression / Assessment and Plan / ED Course  Delora Fuel, MD has reviewed the triage vital signs and the nursing notes.  Pertinent lab results that were available during my care of the patient were reviewed by me and considered in my medical decision making (see chart for details).  Clinical Course    Vertigo which seems to be peripheral vertigo. No red flags to suggest more serious condition. Old records are reviewed and she has no prior visits for dizziness. She's given a dose of ondansetron and meclizine with excellent resolution of  symptoms. Following this, she was able to lie flat without any dizziness. She is discharged with prescriptions for ondansetron and meclizine.  Final Clinical Impressions(s) / ED Diagnoses  Final diagnoses:  Peripheral vertigo, unspecified laterality    New Prescriptions New Prescriptions   MECLIZINE (ANTIVERT) 25 MG TABLET    Take 1 tablet (25 mg total) by mouth 3 (three) times daily as needed for dizziness.   ONDANSETRON (ZOFRAN) 4 MG TABLET    Take 1 tablet (4 mg total) by mouth every 6 (six) hours as needed for nausea or vomiting.   I personally performed the services described in this documentation, which was scribed in my presence. The recorded information has been reviewed and is accurate.       Delora Fuel, MD [id number]

## 2016-06-26 DIAGNOSIS — T8484XA Pain due to internal orthopedic prosthetic devices, implants and grafts, initial encounter: Secondary | ICD-10-CM

## 2016-07-10 ENCOUNTER — Ambulatory Visit (INDEPENDENT_AMBULATORY_CARE_PROVIDER_SITE_OTHER): Payer: Medicare Other | Admitting: Orthopaedic Surgery

## 2016-07-10 ENCOUNTER — Encounter (INDEPENDENT_AMBULATORY_CARE_PROVIDER_SITE_OTHER): Payer: Self-pay | Admitting: Orthopaedic Surgery

## 2016-07-10 DIAGNOSIS — S82142S Displaced bicondylar fracture of left tibia, sequela: Secondary | ICD-10-CM

## 2016-07-11 NOTE — Progress Notes (Signed)
Patient is 2 weeks status post removal of a proximal tibial plate. She is doing much better. She no longer has the pain from the hardware on the lateral and medial side. She is ambulating well. Physical exam shows well-healed incision without any signs of infection or significant swelling. At this point she can increase activity as tolerated. I recommend no running for 6 weeks but then may resume in full activity. Follow-up with me as needed.

## 2017-05-19 ENCOUNTER — Emergency Department (HOSPITAL_COMMUNITY): Payer: Medicare Other

## 2017-05-19 ENCOUNTER — Inpatient Hospital Stay (HOSPITAL_COMMUNITY)
Admission: EM | Admit: 2017-05-19 | Discharge: 2017-05-24 | DRG: 907 | Disposition: A | Discharge status: Other Acute Inpatient Hospital | Payer: Medicare Other | Attending: Internal Medicine | Admitting: Internal Medicine

## 2017-05-19 ENCOUNTER — Encounter (HOSPITAL_COMMUNITY)
Admission: EM | Disposition: A | Discharge status: Other Acute Inpatient Hospital | Payer: Self-pay | Source: Home / Self Care | Attending: Pulmonary Disease

## 2017-05-19 ENCOUNTER — Encounter (HOSPITAL_COMMUNITY): Payer: Self-pay | Admitting: Emergency Medicine

## 2017-05-19 DIAGNOSIS — N170 Acute kidney failure with tubular necrosis: Secondary | ICD-10-CM | POA: Diagnosis present

## 2017-05-19 DIAGNOSIS — E1140 Type 2 diabetes mellitus with diabetic neuropathy, unspecified: Secondary | ICD-10-CM | POA: Diagnosis present

## 2017-05-19 DIAGNOSIS — F329 Major depressive disorder, single episode, unspecified: Secondary | ICD-10-CM | POA: Diagnosis present

## 2017-05-19 DIAGNOSIS — F17210 Nicotine dependence, cigarettes, uncomplicated: Secondary | ICD-10-CM | POA: Diagnosis present

## 2017-05-19 DIAGNOSIS — T461X1A Poisoning by calcium-channel blockers, accidental (unintentional), initial encounter: Secondary | ICD-10-CM | POA: Diagnosis not present

## 2017-05-19 DIAGNOSIS — Y92009 Unspecified place in unspecified non-institutional (private) residence as the place of occurrence of the external cause: Secondary | ICD-10-CM | POA: Diagnosis not present

## 2017-05-19 DIAGNOSIS — J9690 Respiratory failure, unspecified, unspecified whether with hypoxia or hypercapnia: Secondary | ICD-10-CM

## 2017-05-19 DIAGNOSIS — G4710 Hypersomnia, unspecified: Secondary | ICD-10-CM | POA: Diagnosis present

## 2017-05-19 DIAGNOSIS — R570 Cardiogenic shock: Secondary | ICD-10-CM | POA: Diagnosis present

## 2017-05-19 DIAGNOSIS — T1491XA Suicide attempt, initial encounter: Secondary | ICD-10-CM

## 2017-05-19 DIAGNOSIS — G4730 Sleep apnea, unspecified: Secondary | ICD-10-CM | POA: Diagnosis present

## 2017-05-19 DIAGNOSIS — R579 Shock, unspecified: Secondary | ICD-10-CM | POA: Diagnosis not present

## 2017-05-19 DIAGNOSIS — F429 Obsessive-compulsive disorder, unspecified: Secondary | ICD-10-CM | POA: Diagnosis present

## 2017-05-19 DIAGNOSIS — R402413 Glasgow coma scale score 13-15, at hospital admission: Secondary | ICD-10-CM | POA: Diagnosis present

## 2017-05-19 DIAGNOSIS — T46902A Poisoning by unspecified agents primarily affecting the cardiovascular system, intentional self-harm, initial encounter: Secondary | ICD-10-CM | POA: Diagnosis not present

## 2017-05-19 DIAGNOSIS — Z9884 Bariatric surgery status: Secondary | ICD-10-CM | POA: Diagnosis not present

## 2017-05-19 DIAGNOSIS — G92 Toxic encephalopathy: Secondary | ICD-10-CM | POA: Diagnosis present

## 2017-05-19 DIAGNOSIS — Z23 Encounter for immunization: Secondary | ICD-10-CM | POA: Diagnosis present

## 2017-05-19 DIAGNOSIS — E1165 Type 2 diabetes mellitus with hyperglycemia: Secondary | ICD-10-CM | POA: Diagnosis present

## 2017-05-19 DIAGNOSIS — T50902S Poisoning by unspecified drugs, medicaments and biological substances, intentional self-harm, sequela: Secondary | ICD-10-CM | POA: Diagnosis not present

## 2017-05-19 DIAGNOSIS — K589 Irritable bowel syndrome without diarrhea: Secondary | ICD-10-CM | POA: Diagnosis present

## 2017-05-19 DIAGNOSIS — Z9181 History of falling: Secondary | ICD-10-CM | POA: Diagnosis not present

## 2017-05-19 DIAGNOSIS — R109 Unspecified abdominal pain: Secondary | ICD-10-CM | POA: Diagnosis present

## 2017-05-19 DIAGNOSIS — M549 Dorsalgia, unspecified: Secondary | ICD-10-CM | POA: Diagnosis present

## 2017-05-19 DIAGNOSIS — T424X2A Poisoning by benzodiazepines, intentional self-harm, initial encounter: Secondary | ICD-10-CM | POA: Diagnosis present

## 2017-05-19 DIAGNOSIS — T46901A Poisoning by unspecified agents primarily affecting the cardiovascular system, accidental (unintentional), initial encounter: Secondary | ICD-10-CM | POA: Diagnosis not present

## 2017-05-19 DIAGNOSIS — E162 Hypoglycemia, unspecified: Secondary | ICD-10-CM | POA: Diagnosis not present

## 2017-05-19 DIAGNOSIS — R4182 Altered mental status, unspecified: Secondary | ICD-10-CM

## 2017-05-19 DIAGNOSIS — I440 Atrioventricular block, first degree: Secondary | ICD-10-CM | POA: Diagnosis present

## 2017-05-19 DIAGNOSIS — K529 Noninfective gastroenteritis and colitis, unspecified: Secondary | ICD-10-CM | POA: Diagnosis present

## 2017-05-19 DIAGNOSIS — E876 Hypokalemia: Secondary | ICD-10-CM | POA: Diagnosis not present

## 2017-05-19 DIAGNOSIS — E785 Hyperlipidemia, unspecified: Secondary | ICD-10-CM | POA: Diagnosis present

## 2017-05-19 DIAGNOSIS — G4733 Obstructive sleep apnea (adult) (pediatric): Secondary | ICD-10-CM | POA: Diagnosis present

## 2017-05-19 DIAGNOSIS — I10 Essential (primary) hypertension: Secondary | ICD-10-CM | POA: Diagnosis present

## 2017-05-19 DIAGNOSIS — I340 Nonrheumatic mitral (valve) insufficiency: Secondary | ICD-10-CM | POA: Diagnosis not present

## 2017-05-19 DIAGNOSIS — Z888 Allergy status to other drugs, medicaments and biological substances status: Secondary | ICD-10-CM | POA: Diagnosis not present

## 2017-05-19 DIAGNOSIS — I959 Hypotension, unspecified: Secondary | ICD-10-CM | POA: Diagnosis not present

## 2017-05-19 DIAGNOSIS — Z791 Long term (current) use of non-steroidal anti-inflammatories (NSAID): Secondary | ICD-10-CM | POA: Diagnosis not present

## 2017-05-19 DIAGNOSIS — F323 Major depressive disorder, single episode, severe with psychotic features: Secondary | ICD-10-CM | POA: Diagnosis present

## 2017-05-19 DIAGNOSIS — Z79899 Other long term (current) drug therapy: Secondary | ICD-10-CM | POA: Diagnosis not present

## 2017-05-19 DIAGNOSIS — Z882 Allergy status to sulfonamides status: Secondary | ICD-10-CM | POA: Diagnosis not present

## 2017-05-19 DIAGNOSIS — T461X2A Poisoning by calcium-channel blockers, intentional self-harm, initial encounter: Principal | ICD-10-CM | POA: Diagnosis present

## 2017-05-19 DIAGNOSIS — J90 Pleural effusion, not elsewhere classified: Secondary | ICD-10-CM | POA: Diagnosis not present

## 2017-05-19 DIAGNOSIS — T50902A Poisoning by unspecified drugs, medicaments and biological substances, intentional self-harm, initial encounter: Secondary | ICD-10-CM | POA: Diagnosis not present

## 2017-05-19 DIAGNOSIS — R001 Bradycardia, unspecified: Secondary | ICD-10-CM

## 2017-05-19 DIAGNOSIS — Z9119 Patient's noncompliance with other medical treatment and regimen: Secondary | ICD-10-CM | POA: Diagnosis not present

## 2017-05-19 DIAGNOSIS — E872 Acidosis: Secondary | ICD-10-CM | POA: Diagnosis present

## 2017-05-19 DIAGNOSIS — Z915 Personal history of self-harm: Secondary | ICD-10-CM | POA: Diagnosis not present

## 2017-05-19 DIAGNOSIS — G629 Polyneuropathy, unspecified: Secondary | ICD-10-CM | POA: Diagnosis present

## 2017-05-19 DIAGNOSIS — G4700 Insomnia, unspecified: Secondary | ICD-10-CM | POA: Diagnosis present

## 2017-05-19 DIAGNOSIS — F322 Major depressive disorder, single episode, severe without psychotic features: Secondary | ICD-10-CM | POA: Diagnosis not present

## 2017-05-19 HISTORY — PX: TEMPORARY PACEMAKER: CATH118268

## 2017-05-19 LAB — I-STAT CHEM 8, ED
BUN: 29 mg/dL — ABNORMAL HIGH (ref 6–20)
Calcium, Ion: 1.7 mmol/L (ref 1.15–1.40)
Chloride: 110 mmol/L (ref 101–111)
Creatinine, Ser: 1.5 mg/dL — ABNORMAL HIGH (ref 0.44–1.00)
Glucose, Bld: 190 mg/dL — ABNORMAL HIGH (ref 65–99)
HCT: 38 % (ref 36.0–46.0)
Hemoglobin: 12.9 g/dL (ref 12.0–15.0)
POTASSIUM: 4.1 mmol/L (ref 3.5–5.1)
Sodium: 139 mmol/L (ref 135–145)
TCO2: 21 mmol/L — ABNORMAL LOW (ref 22–32)

## 2017-05-19 LAB — CBC
HEMATOCRIT: 35.4 % — AB (ref 36.0–46.0)
Hemoglobin: 11.8 g/dL — ABNORMAL LOW (ref 12.0–15.0)
MCH: 27.3 pg (ref 26.0–34.0)
MCHC: 33.3 g/dL (ref 30.0–36.0)
MCV: 81.9 fL (ref 78.0–100.0)
PLATELETS: 188 10*3/uL (ref 150–400)
RBC: 4.32 MIL/uL (ref 3.87–5.11)
RDW: 13.7 % (ref 11.5–15.5)
WBC: 11.7 10*3/uL — AB (ref 4.0–10.5)

## 2017-05-19 LAB — COMPREHENSIVE METABOLIC PANEL
ALBUMIN: 3.6 g/dL (ref 3.5–5.0)
ALT: 28 U/L (ref 14–54)
ANION GAP: 11 (ref 5–15)
AST: 33 U/L (ref 15–41)
Alkaline Phosphatase: 49 U/L (ref 38–126)
BILIRUBIN TOTAL: 0.4 mg/dL (ref 0.3–1.2)
BUN: 25 mg/dL — ABNORMAL HIGH (ref 6–20)
CO2: 18 mmol/L — ABNORMAL LOW (ref 22–32)
Calcium: 12.6 mg/dL — ABNORMAL HIGH (ref 8.9–10.3)
Chloride: 107 mmol/L (ref 101–111)
Creatinine, Ser: 1.58 mg/dL — ABNORMAL HIGH (ref 0.44–1.00)
GFR, EST AFRICAN AMERICAN: 38 mL/min — AB (ref >60)
GFR, EST NON AFRICAN AMERICAN: 33 mL/min — AB (ref >60)
Glucose, Bld: 188 mg/dL — ABNORMAL HIGH (ref 65–99)
POTASSIUM: 4.2 mmol/L (ref 3.5–5.1)
Sodium: 136 mmol/L (ref 135–145)
TOTAL PROTEIN: 6.3 g/dL — AB (ref 6.5–8.1)

## 2017-05-19 LAB — CBC WITH DIFFERENTIAL/PLATELET
Basophils Absolute: 0 10*3/uL (ref 0.0–0.1)
Basophils Relative: 0 %
Eosinophils Absolute: 0 10*3/uL (ref 0.0–0.7)
Eosinophils Relative: 0 %
HEMATOCRIT: 39.2 % (ref 36.0–46.0)
HEMOGLOBIN: 12.8 g/dL (ref 12.0–15.0)
LYMPHS ABS: 3.2 10*3/uL (ref 0.7–4.0)
Lymphocytes Relative: 29 %
MCH: 27.6 pg (ref 26.0–34.0)
MCHC: 32.7 g/dL (ref 30.0–36.0)
MCV: 84.5 fL (ref 78.0–100.0)
MONOS PCT: 1 %
Monocytes Absolute: 0.1 10*3/uL (ref 0.1–1.0)
NEUTROS ABS: 7.8 10*3/uL — AB (ref 1.7–7.7)
NEUTROS PCT: 70 %
PLATELETS: 219 10*3/uL (ref 150–400)
RBC: 4.64 MIL/uL (ref 3.87–5.11)
RDW: 14 % (ref 11.5–15.5)
WBC: 11.2 10*3/uL — ABNORMAL HIGH (ref 4.0–10.5)

## 2017-05-19 LAB — I-STAT ARTERIAL BLOOD GAS, ED
ACID-BASE DEFICIT: 8 mmol/L — AB (ref 0.0–2.0)
BICARBONATE: 18.3 mmol/L — AB (ref 20.0–28.0)
O2 SAT: 94 %
TCO2: 19 mmol/L — AB (ref 22–32)
pCO2 arterial: 34.1 mmHg (ref 32.0–48.0)
pH, Arterial: 7.328 — ABNORMAL LOW (ref 7.350–7.450)
pO2, Arterial: 66 mmHg — ABNORMAL LOW (ref 83.0–108.0)

## 2017-05-19 LAB — I-STAT CG4 LACTIC ACID, ED: LACTIC ACID, VENOUS: 4 mmol/L — AB (ref 0.5–1.9)

## 2017-05-19 LAB — ETHANOL

## 2017-05-19 LAB — RAPID URINE DRUG SCREEN, HOSP PERFORMED
Amphetamines: NOT DETECTED
Barbiturates: NOT DETECTED
Benzodiazepines: POSITIVE — AB
Cocaine: NOT DETECTED
OPIATES: NOT DETECTED
Tetrahydrocannabinol: NOT DETECTED

## 2017-05-19 LAB — CREATININE, SERUM
Creatinine, Ser: 1.31 mg/dL — ABNORMAL HIGH (ref 0.44–1.00)
GFR calc non Af Amer: 41 mL/min — ABNORMAL LOW (ref >60)
GFR, EST AFRICAN AMERICAN: 48 mL/min — AB (ref >60)

## 2017-05-19 LAB — MAGNESIUM: Magnesium: 2.5 mg/dL — ABNORMAL HIGH (ref 1.7–2.4)

## 2017-05-19 LAB — ACETAMINOPHEN LEVEL

## 2017-05-19 LAB — GLUCOSE, CAPILLARY
GLUCOSE-CAPILLARY: 252 mg/dL — AB (ref 65–99)
Glucose-Capillary: 199 mg/dL — ABNORMAL HIGH (ref 65–99)
Glucose-Capillary: 252 mg/dL — ABNORMAL HIGH (ref 65–99)

## 2017-05-19 LAB — TROPONIN I: Troponin I: <0.03 ng/mL (ref <0.03)

## 2017-05-19 LAB — SALICYLATE LEVEL

## 2017-05-19 LAB — CBG MONITORING, ED: Glucose-Capillary: 210 mg/dL — ABNORMAL HIGH (ref 65–99)

## 2017-05-19 LAB — CK: Total CK: 63 U/L (ref 38–234)

## 2017-05-19 SURGERY — TEMPORARY PACEMAKER
Anesthesia: LOCAL

## 2017-05-19 MED ORDER — EPINEPHRINE PF 1 MG/ML IJ SOLN
0.5000 ug/min | INTRAVENOUS | Status: DC
  Administered 2017-05-19: 0.5 ug/min via INTRAVENOUS
  Administered 2017-05-20 (×2): 10 ug/min via INTRAVENOUS
  Administered 2017-05-20: 9 ug/min via INTRAVENOUS
  Administered 2017-05-21: 10 ug/min via INTRAVENOUS
  Filled 2017-05-19 (×5): qty 4

## 2017-05-19 MED ORDER — ACETAMINOPHEN 325 MG PO TABS
650.0000 mg | ORAL_TABLET | ORAL | Status: DC | PRN
  Administered 2017-05-20 – 2017-05-24 (×9): 650 mg via ORAL
  Filled 2017-05-19 (×9): qty 2

## 2017-05-19 MED ORDER — HEPARIN SODIUM (PORCINE) 5000 UNIT/ML IJ SOLN: 5000.0000 [IU] | Freq: Three times a day (TID) | INTRAMUSCULAR | Status: DC

## 2017-05-19 MED ORDER — HEPARIN SODIUM (PORCINE) 5000 UNIT/ML IJ SOLN
5000.0000 [IU] | Freq: Three times a day (TID) | INTRAMUSCULAR | Status: DC
  Administered 2017-05-20 – 2017-05-24 (×14): 5000 [IU] via SUBCUTANEOUS
  Filled 2017-05-19 (×14): qty 1

## 2017-05-19 MED ORDER — HEPARIN (PORCINE) IN NACL 2-0.9 UNIT/ML-% IJ SOLN
INTRAMUSCULAR | Status: AC | PRN
  Administered 2017-05-19: 500 mL

## 2017-05-19 MED ORDER — LIDOCAINE HCL 2 % IJ SOLN
INTRAMUSCULAR | Status: DC | PRN
  Administered 2017-05-19: 10 mL

## 2017-05-19 MED ORDER — SODIUM CHLORIDE 0.9% FLUSH: 3.0000 mL | INTRAVENOUS | Status: DC | PRN

## 2017-05-19 MED ORDER — SODIUM CHLORIDE 0.9 % IV SOLN: 250.0000 mL | INTRAVENOUS | Status: DC | PRN

## 2017-05-19 MED ORDER — LACTATED RINGERS IV SOLN
INTRAVENOUS | Status: DC
  Administered 2017-05-19: 18:00:00 via INTRAVENOUS

## 2017-05-19 MED ORDER — ONDANSETRON HCL 4 MG/2ML IJ SOLN
4.0000 mg | Freq: Four times a day (QID) | INTRAMUSCULAR | Status: DC | PRN
  Administered 2017-05-20 – 2017-05-21 (×2): 4 mg via INTRAVENOUS
  Filled 2017-05-19 (×2): qty 2

## 2017-05-19 MED ORDER — SODIUM CHLORIDE 0.9 % IV SOLN
INTRAVENOUS | Status: DC
  Administered 2017-05-19: 18:00:00 via INTRAVENOUS
  Filled 2017-05-19 (×13): qty 100

## 2017-05-19 MED ORDER — DOPAMINE-DEXTROSE 3.2-5 MG/ML-% IV SOLN: 0.0000 ug/kg/min | INTRAVENOUS | Status: DC

## 2017-05-19 MED ORDER — SODIUM CHLORIDE 0.9 % IV SOLN
INTRAVENOUS | Status: DC
  Filled 2017-05-19: qty 1

## 2017-05-19 MED ORDER — LACTATED RINGERS IV BOLUS (SEPSIS)
1000.0000 mL | Freq: Once | INTRAVENOUS | Status: AC
  Administered 2017-05-19: 1000 mL via INTRAVENOUS

## 2017-05-19 MED ORDER — SODIUM CHLORIDE 0.9% FLUSH
3.0000 mL | Freq: Two times a day (BID) | INTRAVENOUS | Status: DC
  Administered 2017-05-19: 9 mL via INTRAVENOUS

## 2017-05-19 MED ORDER — GLUCAGON HCL RDNA (DIAGNOSTIC) 1 MG IJ SOLR
5.0000 mg | INTRAMUSCULAR | Status: AC
  Administered 2017-05-19: 5 mg via INTRAVENOUS
  Filled 2017-05-19: qty 5

## 2017-05-19 MED ORDER — DEXTROSE 10 % IV SOLN: INTRAVENOUS | Status: DC

## 2017-05-19 MED ORDER — MIDAZOLAM HCL 2 MG/2ML IJ SOLN
INTRAMUSCULAR | Status: AC
  Filled 2017-05-19: qty 2

## 2017-05-19 MED ORDER — GLUCAGON HCL RDNA (DIAGNOSTIC) 1 MG IJ SOLR
3.0000 mg/h | INTRAVENOUS | Status: DC
  Administered 2017-05-19 – 2017-05-22 (×10): 5 mg/h via INTRAVENOUS
  Filled 2017-05-19 (×6): qty 25
  Filled 2017-05-19: qty 5
  Filled 2017-05-19: qty 25
  Filled 2017-05-19: qty 5
  Filled 2017-05-19 (×3): qty 25
  Filled 2017-05-19: qty 5
  Filled 2017-05-19: qty 25

## 2017-05-19 MED ORDER — LIDOCAINE HCL 2 % IJ SOLN
INTRAMUSCULAR | Status: AC
  Filled 2017-05-19: qty 20

## 2017-05-19 MED ORDER — HEPARIN (PORCINE) IN NACL 2-0.9 UNIT/ML-% IJ SOLN
INTRAMUSCULAR | Status: AC
  Filled 2017-05-19: qty 500

## 2017-05-19 MED ORDER — ONDANSETRON HCL 4 MG/2ML IJ SOLN
INTRAMUSCULAR | Status: AC
  Administered 2017-05-20: 4 mg via INTRAVENOUS
  Filled 2017-05-19: qty 2

## 2017-05-19 MED ORDER — FENTANYL CITRATE (PF) 100 MCG/2ML IJ SOLN
25.0000 ug | INTRAMUSCULAR | Status: DC | PRN
  Administered 2017-05-20 – 2017-05-24 (×12): 50 ug via INTRAVENOUS
  Filled 2017-05-19 (×12): qty 2

## 2017-05-19 MED ORDER — ONDANSETRON HCL 4 MG/2ML IJ SOLN
4.0000 mg | Freq: Once | INTRAMUSCULAR | Status: AC
  Administered 2017-05-19: 4 mg via INTRAVENOUS

## 2017-05-19 MED ORDER — SODIUM CHLORIDE 0.9 % IV SOLN
INTRAVENOUS | Status: DC
  Administered 2017-05-19 – 2017-05-21 (×11): via INTRAVENOUS
  Filled 2017-05-19 (×33): qty 250

## 2017-05-19 MED ORDER — MIDAZOLAM HCL 2 MG/2ML IJ SOLN
2.0000 mg | Freq: Once | INTRAMUSCULAR | Status: AC
  Administered 2017-05-19: 2 mg via INTRAVENOUS

## 2017-05-19 SURGICAL SUPPLY — 8 items
CABLE ADAPT CONN TEMP 6FT (ADAPTER) ×2 IMPLANT
CATH S G BIP PACING (SET/KITS/TRAYS/PACK) ×2 IMPLANT
COVER PRB 48X5XTLSCP FOLD TPE (BAG) ×1 IMPLANT
COVER PROBE 5X48 (BAG) ×1
PACK CARDIAC CATHETERIZATION (CUSTOM PROCEDURE TRAY) ×2 IMPLANT
SHEATH PINNACLE 6F 10CM (SHEATH) ×2 IMPLANT
SLEEVE REPOSITIONING LENGTH 30 (MISCELLANEOUS) ×2 IMPLANT
WIRE EMERALD 3MM-J .025X260CM (WIRE) ×2 IMPLANT

## 2017-05-19 NOTE — H&P (View-Only) (Signed)
Cardiology Consult    Patient ID: Deloria Brassfield Mabry MRN: [id number], DOB/AGE: Jun 06, 1951   Admit date: 05/19/2017 Date of Consult: 05/19/2017  Primary Physician: Jefm Petty, MD Primary Cardiologist: New Requesting Provider: Kathrynn Humble Reason for Consultation: Bradycardia  Rachel White is a 66 y.o. female who is being seen today for the evaluation of bradycardia at the request of Dr. Concepcion Living & Dr. Halford Chessman.   Patient Profile    66 yo female with PMH of HTN, HL, DM who presented with AMS and bradycardia.   Past Medical History   Past Medical History:  Diagnosis Date  . Complication of anesthesia    some difficulty waking up after tubes tied  . Diabetes mellitus without complication ([name])    diet controlled  . Headache    migraines years ago  . Hypertension   . Irritable bowel syndrome    under control  . Neuropathic pain   . Pneumonia    as a child  . Sleep apnea    does not use C_pap    Past Surgical History:  Procedure Laterality Date  . COLONOSCOPY N/A 10/29/2015   Procedure: COLONOSCOPY;  Surgeon: Clarene Essex, MD;  Location: [facility];  Service: Endoscopy;  Laterality: N/A;  . DILATION AND CURETTAGE OF UTERUS    . FOOT SURGERY Left   . GASTRIC BYPASS    . ORIF TIBIA PLATEAU Left 10/19/2014   Procedure: OPEN REDUCTION INTERNAL FIXATION (ORIF) LEFT BICONDYLAR TIBIAL PLATEAU;  Surgeon: Leandrew Koyanagi, MD;  Location: [facility];  Service: Orthopedics;  Laterality: Left;  . TUBAL LIGATION       Allergies  Allergies  Allergen Reactions  . Sulfa Antibiotics Other (See Comments)    Pt. Does not recall the reaction   . Trazodone Other (See Comments)    Visual Disturbances as well as Tingling sensation  . Zolpidem Tartrate Other (See Comments)    Altered mental status     History of Present Illness    Mrs. Lawhead is a 70 with PMH of HTN, HL, DM who presented with AMS from home. She was brought to the ED after her husband found her at home unresponsive on the  sofa. Reported when he went up stairs he found several open pill bottles on her bed. Multiple bottles that included her home blood pressure medications of amlodipine and lisinopril. On EMS arrival HR was in the 20-30s. She was given Ca Chloride, glucagon, and was externally paced.   Per her husband, he is unsure whether this was an intentional overdose, no hx of SI in the past.   In the ED she is still requiring temp pacing with external pads. In review of EKG strips from EMS appears to be sinus bradycardia. She was started on IV fluids and insulin. Initially blood pressure was stable, but now soft in the 23N systolic. A-line in place. She does to verbal stimuli, able to state her name.    Inpatient Medications    . heparin  5,000 Units Subcutaneous Q8H  . ondansetron        Family History    Family History  Problem Relation Age of Onset  . Cancer Mother   . Cancer Other   . COPD Other   . Diabetes type II Neg Hx     Social History    Social History   Social History  . Marital status: Married    Spouse name: N/A  . Number of children: N/A  . Years of education: N/A  Occupational History  . Not on file.   Social History Main Topics  . Smoking status: Current Every Day Smoker    Packs/day: 1.00    Years: 41.00    Types: Cigarettes  . Smokeless tobacco: Never Used  . Alcohol use No  . Drug use: No  . Sexual activity: Not on file   Other Topics Concern  . Not on file   Social History Narrative  . No narrative on file     Review of Systems    See HPI  All other systems reviewed and are otherwise negative except as noted above.  Physical Exam    Blood pressure (!) 83/64, pulse (!) 43, temperature (!) 94.6 F (34.8 C), temperature source Tympanic, resp. rate 16, height 5\' 6"  (1.676 m), weight 170 lb (77.1 kg), SpO2 100 %.  General: Lethargic, responds to verbal stimuli Neuro: Alert to self HEENT: Normal  Neck: Supple without bruits or JVD. Lungs:  Resp  regular and unlabored, CTA. Heart: bradycardic, no s3, s4, or murmurs. Abdomen: Soft, non-tender, non-distended, BS + x 4.  Extremities: No clubbing, cyanosis or edema. DP/PT/Radials 2+ and equal bilaterally.  Labs    Troponin (Point of Care Test) No results for input(s): TROPIPOC in the last 72 hours.  Recent Labs  05/19/17 1730  TROPONINI <0.03   Lab Results  Component Value Date   WBC 11.2 (H) 05/19/2017   HGB 12.9 05/19/2017   HCT 38.0 05/19/2017   MCV 84.5 05/19/2017   PLT 219 05/19/2017    Recent Labs Lab 05/19/17 1730 05/19/17 1754  NA 136 139  K 4.2 4.1  CL 107 110  CO2 18*  --   BUN 25* 29*  CREATININE 1.58* 1.50*  CALCIUM 12.6*  --   PROT 6.3*  --   BILITOT 0.4  --   ALKPHOS 49  --   ALT 28  --   AST 33  --   GLUCOSE 188* 190*   Lab Results  Component Value Date   CHOL 195 08/01/2012   HDL 34 (L) 08/01/2012   LDLCALC 136 (H) 08/01/2012   TRIG 126 08/01/2012   No results found for: [facility]   Radiology Studies    Dg Chest Port 1 View  Result Date: 05/19/2017 CLINICAL DATA:  Overdose EXAM: PORTABLE CHEST 1 VIEW COMPARISON:  05/19/2006 FINDINGS: Artifact overlies the chest. Heart size is normal. There is aortic atherosclerosis. Lungs are clear. The vascularity is normal. No effusions. No bone abnormalities. IMPRESSION: No active disease.  Aortic atherosclerosis. Electronically Signed   By: Nelson Chimes M.D.   On: 05/19/2017 17:46    ECG & Cardiac Imaging    EKG: No EKG in the ED, EMS strips show SB rate in the 30s.    Assessment & Plan    66 yo female with PMH of HTN, HL, DM who presented with AMS and bradycardia.   1. Profound Bradycardia: Currently externally pacing in the ED. Blood pressures are soft, but somewhat responsive to IVFs. Given Ca Chloride and glucagon en route, IV insulin in the ED. Unclear which medications she took as the bottles were lost in routine. Will need a temp wire placed. Discussed with on-call MD.   2. AMS: suspect  2/2 to intentional overdose on home blood pressure medications. Will ultimately need psych eval.    3. Hypotension: blood pressures are labile. Currently with systolic pressure in the 18A. PCCM attempting resuscitation with IVFs. Considered dopamine, but question in the ER about this  being contradicted in CCB overdose? Will defer to MD regarding further therapy.    Barnet Pall, NP-C Pager [phone] 05/19/2017, 8:07 PM    I have seen, examined and evaluated the patient this PM along with Reino Bellis, NP-C.  After reviewing all the available data and chart, we discussed the patients laboratory, study & physical findings as well as symptoms in detail. I agree with her findings, examination as well as impression recommendations as per our discussion.    66 year old woman with suspected suicide attempt using multiple medication overdose.  She is now currently in borderline shock with profound symptom medic bradycardia with rates in the 30s.  Inadequate capture with transcutaneous pacing.  I was asked to assist with temporary pacemaker placement as a bridging therapy to allow for medicine detoxification.  Informed consent was obtained from the patient's husband.  The patient does have a DNR order, but this is void in the setting of suicide attempt.    Glenetta Hew, M.D., M.S. Interventional Cardiologist   Pager # [phone] Phone # [phone] [address]. [place],  [address]

## 2017-05-19 NOTE — Procedures (Signed)
Arterial Catheter Insertion Procedure Note Ross Hefferan Wadding [id number] 09-03-1950  Procedure: Insertion of Arterial Catheter  Indications: Blood pressure monitoring  Procedure Details Consent: Unable to obtain consent because of altered level of consciousness. Time Out: Verified patient identification, verified procedure, site/side was marked, verified correct patient position, special equipment/implants available, medications/allergies/relevent history reviewed, required imaging and test results available.  Performed  Maximum sterile technique was used including antiseptics, cap, gloves, gown, hand hygiene, mask and sheet. Skin prep: Iodine solution; local anesthetic administered 20 gauge catheter was inserted into right radial artery using the Seldinger technique.  Evaluation Blood flow good; BP tracing good. Complications: No apparent complications.   [place]ingo Amber Brittney Mucha 05/19/2017

## 2017-05-19 NOTE — ED Triage Notes (Signed)
Per EMS pt was at home found by husband nonresponsive in bed, pt was found laying in medications with amlodipine and statins, HR down to 24 was given 1G ca chloride, 2mg  glucagon, 5mg  versed, 4mg  zofran PTA. EMS externally paced pt at rate of 60 with 100 milliamps. Pt withdraws from pain, incomprehensible speech

## 2017-05-19 NOTE — Interval H&P Note (Signed)
History and Physical Interval Note:  05/19/2017 9:05 PM  Rachel White  has presented today for surgery, with the diagnosis of Temp Wire  The various methods of treatment have been discussed with the patient and family. After consideration of risks, benefits and other options for treatment, the patient has consented to  Procedure(s): TEMPORARY PACEMAKER (N/A) as a surgical intervention .  The patient's history has been reviewed, patient examined, no change in status, stable for surgery.  I have reviewed the patient's chart and labs.  Questions were answered to the patient's satisfaction.     Glenetta Hew

## 2017-05-19 NOTE — H&P (Signed)
PULMONARY / CRITICAL CARE MEDICINE   Name: Rachel White MRN: [id number] DOB: 07/11/1951    ADMISSION DATE:  05/19/2017 CONSULTATION DATE:  05/19/17  REFERRING MD:  Kathrynn Humble  CHIEF COMPLAINT:  AMS after calcium channel overdose  HISTORY OF PRESENT ILLNESS:  Pt is encephelopathic; therefore, this HPI is obtained from chart review. Rachel White is a 66 y.o. female with PMH as outlined below. She was brought to [facility] ED 10/31 after her husband found her at their home unresponsive on the sofa.  Husband states that when he went upstairs, he found open pill bottles as well as pills that had spilled into her bed (likely amlodipine).   EMS was dispatched and upon their arrival, HR was in the 20's.  She was given Ca chloride, glucagon, and was started on external pacing.  She was brought to ED where she continued to require temp pacing.  She was started on insulin and glucagon infusions.  BP initially low, but currently on high side; therefore, vasopressors held for now.  Per ED RN, husband was unsure whether this was intentional overdose.  Possibly SI.  At the time of my eval, husband was not at bedside and was not available in ED for further questioning / history.  Based off her chart, she has no documented history of SI, overdose, or psychiatric illness.  ADDENDUM: I managed to get hold of husband over the phone.  He tells me he is not aware of pt wanting to harm herself.  He states she has never tried this before.  Unsure of what pills she took but agrees likely amlodipine.  Is agreeable to pysch consult in AM to ensure no SI / self harm intended.   PAST MEDICAL HISTORY :  She  has a past medical history of Complication of anesthesia; Diabetes mellitus without complication ([place]); Headache; Hypertension; Irritable bowel syndrome; Neuropathic pain; Pneumonia; and Sleep apnea.  PAST SURGICAL HISTORY: She  has a past surgical history that includes Gastric bypass; Foot surgery (Left);  Dilation and curettage of uterus; Tubal ligation; ORIF tibia plateau (Left, 10/19/2014); and Colonoscopy (N/A, 10/29/2015).  Allergies  Allergen Reactions  . Sulfa Antibiotics Other (See Comments)    Pt. Does not recall the reaction   . Trazodone Other (See Comments)    Visual Disturbances as well as Tingling sensation  . Zolpidem Tartrate Other (See Comments)    Altered mental status     No current facility-administered medications on file prior to encounter.    Current Outpatient Prescriptions on File Prior to Encounter  Medication Sig  . amLODipine (NORVASC) 5 MG tablet Take 5 mg by mouth every morning.   Marland Kitchen atorvastatin (LIPITOR) 10 MG tablet Take 10 mg by mouth every evening.  . gabapentin (NEURONTIN) 800 MG tablet Take 800 mg by mouth 3 (three) times daily.  Marland Kitchen lisinopril (PRINIVIL,ZESTRIL) 10 MG tablet Take 10 mg by mouth every morning.  Marland Kitchen LORazepam (ATIVAN) 1 MG tablet Take 1 tablet (1 mg total) by mouth at bedtime and may repeat dose one time if needed. (Patient taking differently: Take 1 mg by mouth at bedtime. )  . Melatonin 10 MG CAPS Take 20 mg by mouth at bedtime.   . Multiple Vitamins-Calcium (VIACTIV MULTI-VITAMIN) CHEW Chew 1 each by mouth daily.  . Probiotic Product (ALIGN PO) Take 1 capsule by mouth every other day.  Marland Kitchen tiZANidine (ZANAFLEX) 2 MG tablet Take 4 mg by mouth at bedtime.    FAMILY HISTORY:  Her indicated that her  mother is deceased. She indicated that her father is deceased. She indicated that the status of her neg hx is unknown. She indicated that the status of her other is unknown.    SOCIAL HISTORY: She  reports that she has been smoking Cigarettes.  She has a 41.00 pack-year smoking history. She has never used smokeless tobacco. She reports that she does not drink alcohol or use drugs.  REVIEW OF SYSTEMS:   Unable to obtain as pt is encephalopathic.  SUBJECTIVE:  Being externally paced at 70bpm.  Opens eyes to noxious stimuli.  Per RN was able to  answer basic questions earlier, but currently not answering any.  VITAL SIGNS: BP (!) 82/72   Pulse 70   Temp (!) 94.6 F (34.8 C) (Tympanic)   Resp 18   Ht 5\' 6"  (1.676 m)   Wt 77.1 kg (170 lb)   SpO2 100%   BMI 27.44 kg/m   HEMODYNAMICS:    VENTILATOR SETTINGS:    INTAKE / OUTPUT: No intake/output data recorded.   PHYSICAL EXAMINATION: General: Adult female, critically ill. Neuro: Altered.  Opens eyes to noxious stimuli.  Does not follow commands. HEENT: [place]/AT. PERRL, sclerae anicteric. Cardiovascular: RRR, no M/R/G. Being externally paced at 70bpm. Lungs: Respirations even and unlabored.  CTA bilaterally, No W/R/R. Abdomen: BS x 4, soft, NT/ND.  Musculoskeletal: No gross deformities, no edema.  Skin: Superficial abrasions to LUE.  Skin otherwise warm, no rashes.  LABS:  BMET  Recent Labs Lab 05/19/17 1754  NA 139  K 4.1  CL 110  BUN 29*  CREATININE 1.50*  GLUCOSE 190*    Electrolytes No results for input(s): CALCIUM, MG, PHOS in the last 168 hours.  CBC  Recent Labs Lab 05/19/17 1730 05/19/17 1754  WBC 11.2*  --   HGB 12.8 12.9  HCT 39.2 38.0  PLT 219  --     Coag's No results for input(s): APTT, INR in the last 168 hours.  Sepsis Markers  Recent Labs Lab 05/19/17 1754  LATICACIDVEN 4.00*    ABG  Recent Labs Lab 05/19/17 1743  PHART 7.328*  PCO2ART 34.1  PO2ART 66.0*    Liver Enzymes No results for input(s): AST, ALT, ALKPHOS, BILITOT, ALBUMIN in the last 168 hours.  Cardiac Enzymes No results for input(s): TROPONINI, PROBNP in the last 168 hours.  Glucose No results for input(s): GLUCAP in the last 168 hours.  Imaging Dg Chest Port 1 View  Result Date: 05/19/2017 CLINICAL DATA:  Overdose EXAM: PORTABLE CHEST 1 VIEW COMPARISON:  05/19/2006 FINDINGS: Artifact overlies the chest. Heart size is normal. There is aortic atherosclerosis. Lungs are clear. The vascularity is normal. No effusions. No bone abnormalities.  IMPRESSION: No active disease.  Aortic atherosclerosis. Electronically Signed   By: Nelson Chimes M.D.   On: 05/19/2017 17:46     STUDIES:  CXR 10/31 > no acute process.  CULTURES: None.  ANTIBIOTICS: None.  SIGNIFICANT EVENTS: 10/31 > admit.  LINES/TUBES: None.  DISCUSSION: 66 y.o. female admitted 10/31 after presumed overdose of calcium channel blockers, presumed intentional (husband unsure).  Currently requiring external temp pacing, insulin infusion, glucagon infusion.  ASSESSMENT / PLAN:  PULMONARY A: At risk for intubation if mental status worsens. Hx OSA - does not use CPAP. P:   Monitor closely.  CARDIOVASCULAR A:  Profound bradycardia - due to presumed CCB overdose (amlodipine). Labile BP's - was initially hypotensive, but currently hypertensive. Hx HTN. P:  Continue insulin gtt, glucagon gtt. No need for vasopressors  at this point.  If gets hypotensive again, might need levophed temporarily. Might ned additional lipid emulsion therapy if does not respond to above therapies. Continue temp external pacing. Might need cardiology consult if does not respond to above therapies and needs continued pacing. Assess EKG now and in AM. Repeat lactate, troponin. Might need echo. Hold preadmission amlodipine, atorvastatin, lisinopril.  RENAL A:   AKI. P:   BMP in AM.  GASTROINTESTINAL A: Nutrition. Hx IBS. P:   NPO.  HEMATOLOGIC A:   VTE Prophylaxis. P:  SCD's / heparin. CBC in AM.  INFECTIOUS A:   No indication of infection. P:   Monitor clinically.  ENDOCRINE A:   Hx DM.   P:   Continue insulin gtt for CCB overdose.  NEUROLOGIC A:   Acute encephalopathy - presumed due to intentional CCB overdose. ? Suicidal attempt via intentional overdose - husband unsure, states that pt has never hinted towards this at all. Hx neuropathic pain, headache. P:   Avoid all sedating meds. Psychiatric consult in AM. Suicide precautions for now. Hold  preadmission gabapentin, lorazepam, melatonin, tizanidine.  Family updated: Updated husband, Rachel White over the phone.  Interdisciplinary Family Meeting v Palliative Care Meeting:  Due by: 05/25/17.  CC time: 35 min.   Montey Hora, [place] [facility] Pager: [phone]  or [phone] 05/19/2017, 6:29 PM     --------------------------- I have seen and examined the patient.  Agree with APP's Assessment and plan. Please see my comments as follows.   66 y/o female with HTN, likely overdosed with amlodipine, presented to ED by EMS with bradycardia and hypotension. HR in 20-30s, has transcutaneous pacing 70/min, with fluctuation in BP as low as 60 systolic and as high as 102 systolic. Cuff NIBP may be inaccurate. Left shoulder jerking from continuous pacing. Patient is awake, responsive. She said she wanted to die. Received Calcium, glucagon, HDInsulin in ED. Poison control recommended Norepi or Epi (avoid dopamine, vasopressin) and to take off TCP pads for now. Advised Bicarb for QRS 112msec. POCUS showed IVC collapsibility and adequately contracting cardiac chambers. Pupils equal, chest- clear, diminished breath sounds, no murmur, abd-soft, power 5/5. Labs noted. Recommend- temp Pacer wire to be able to pace as needed before removing TC pacing pads. A line for accurate BP monitoring. Vasopressor to keep MAP . 49mmHg. No immediate need to intubate , but low threshold to do so if needed. Continue HD insulin Rx. F/u with poison control. F/u EKG. IV fluid  For volume resuscitation. ICu admission. Cardiology consultation. D/w ER -Dr Kem Boroughs. Critically ill. Signed out to night ICU team.    Thank you for letting me participate in the care of your patient.  ^^^^^^^^^^  I Have personally spent 70 Minutes In the care of this Patient providing Critical care Services; Time includes review of chart, labs, imaging, coordinating care with other  physicians and healthcare team members. Also includes time for frequent reevaluation and additional treatment implementation due to change in clinical condiiton of patient. Excludes time spent for Procedure and Teaching.  ^^^^^^^^^^  Note subject to typographical and grammatical errors;  Any formal questions or concerns about the content, text, or information contained within the body of this dictation should be directly addressed to the physician for clarification.    Evans Lance, MD Pulmonary and [place] Pager: [phone]

## 2017-05-19 NOTE — ED Notes (Signed)
I-stat chem 8 and lactic acid result given to Dr. Kathrynn Humble

## 2017-05-19 NOTE — ED Provider Notes (Signed)
[facility] EMERGENCY DEPARTMENT Provider Note   CSN: [id number] Arrival date & time: 05/19/17  1721     History   Chief Complaint Chief Complaint  Patient presents with  . Drug Overdose    HPI [place] Tigert is a 66 y.o. female.  HPI  Level 5 caveat for altered mental status. Patient comes in with chief complaint of suspected overdose.  According to EMS, patient's husband arrived from work and noted that patient was slurring, confused and therefore called EMS.  Upon EMS arrival they noted that patient had multiple pills around her, and that patient was bradycardic.  Patient reported to them that "she wanted to die"and admitted to an overdose.  Patient arrives to the emergency room with transcutaneous pacing.  Is somnolent, she was given Versed 5 mg in total in route.  She reports to me that she took her medications around 9:30 AM.  Patient does not clearly tell me how much medication she took.  She does admit to suicide attempt.  I spoke with patient's husband who reports that she patient was last seen normal last night.  He denies any known history of severe depression or prior suicide attempt.  He denies any history of substance abuse.   Past Medical History:  Diagnosis Date  . Complication of anesthesia    some difficulty waking up after tubes tied  . Diabetes mellitus without complication ([name])    diet controlled  . Headache    migraines years ago  . Hypertension   . Irritable bowel syndrome    under control  . Neuropathic pain   . Pneumonia    as a child  . Sleep apnea    does not use C_pap    Patient Active Problem List   Diagnosis Date Noted  . Overdose of calcium-channel [place] 05/19/2017  . Acute blood loss anemia 11/10/2015  . Severe epistaxis 11/09/2015  . Epistaxis 11/08/2015  . Dehydration 10/26/2015  . Acute renal failure (ARF) ([place]) 10/26/2015  . Hypotension 10/26/2015  . Hypokalemia 10/26/2015  . Diabetes mellitus ([place])  10/26/2015  . Sleep apnea 10/26/2015  . Diarrhea 10/26/2015  . Fracture of left tibial plateau 10/19/2014  . Tibial plateau fracture 10/19/2014  . Peripheral neuropathy ([place]) 08/01/2012  . Insomnia 08/01/2012  . Chronic diarrhea 08/01/2012    Past Surgical History:  Procedure Laterality Date  . COLONOSCOPY N/A 10/29/2015   Procedure: COLONOSCOPY;  Surgeon: Clarene Essex, MD;  Location: [facility];  Service: Endoscopy;  Laterality: N/A;  . DILATION AND CURETTAGE OF UTERUS    . FOOT SURGERY Left   . GASTRIC BYPASS    . ORIF TIBIA PLATEAU Left 10/19/2014   Procedure: OPEN REDUCTION INTERNAL FIXATION (ORIF) LEFT BICONDYLAR TIBIAL PLATEAU;  Surgeon: Leandrew Koyanagi, MD;  Location: [facility];  Service: Orthopedics;  Laterality: Left;  . TUBAL LIGATION      OB History    No data available       Home Medications    Prior to Admission medications   Medication Sig Start Date End Date Taking? Authorizing Provider  amLODipine (NORVASC) 5 MG tablet Take 5 mg by mouth every morning.    Yes [provider]  atorvastatin (LIPITOR) 10 MG tablet Take 10 mg by mouth every evening.   Yes [provider]  bifidobacterium infantis (ALIGN) capsule Take 1 capsule by mouth daily.   Yes [provider]  gabapentin (NEURONTIN) 800 MG tablet Take 800 mg by mouth 3 (three) times  daily.   Yes [provider]  lisinopril (PRINIVIL,ZESTRIL) 10 MG tablet Take 10 mg by mouth every morning.   Yes [provider]  LORazepam (ATIVAN) 1 MG tablet Take 1 tablet (1 mg total) by mouth at bedtime and may repeat dose one time if needed. Patient taking differently: Take 1 mg by mouth at bedtime.  08/01/12  Yes Robyn Haber, MD  Melatonin 10 MG CAPS Take 20 mg by mouth at bedtime.    Yes [provider]  meloxicam (MOBIC) 15 MG tablet Take 15 mg by mouth as needed. 02/11/17  Yes [provider]  Multiple Vitamins-Calcium (VIACTIV MULTI-VITAMIN) CHEW Chew 1 each by  mouth daily.   Yes [provider]  tiZANidine (ZANAFLEX) 2 MG tablet Take 4 mg by mouth at bedtime.   Yes [provider]    Family History Family History  Problem Relation Age of Onset  . Cancer Mother   . Cancer Other   . COPD Other   . Diabetes type II Neg Hx     Social History Social History  Substance Use Topics  . Smoking status: Current Every Day Smoker    Packs/day: 1.00    Years: 41.00    Types: Cigarettes  . Smokeless tobacco: Never Used  . Alcohol use No     Allergies   Sulfa antibiotics; Trazodone; and Zolpidem tartrate   Review of Systems Review of Systems  Unable to perform ROS: Mental status change     Physical Exam Updated Vital Signs BP (!) 90/35   Pulse (!) 50   Temp (!) 91 F (32.8 C) (Rectal)   Resp 14   Ht 5\' 6"  (1.676 m)   Wt 77.1 kg (170 lb)   SpO2 99%   BMI 27.44 kg/m   Physical Exam  Constitutional: She appears well-developed.  HENT:  Head: Normocephalic and atraumatic.  Eyes: Pupils are equal, round, and reactive to light. EOM are normal.  Neck: Neck supple.  Cardiovascular: Normal rate.   paced  Pulmonary/Chest: Effort normal.  Abdominal: Bowel sounds are normal.  Neurological:  Somnolent, responds to name and painful stimuli.  Moving all 4 extremities  Skin: Rash noted.  cool  Nursing note and vitals reviewed.    ED Treatments / Results  Labs (all labs ordered are listed, but only abnormal results are displayed) Labs Reviewed  COMPREHENSIVE METABOLIC PANEL - Abnormal; Notable for the following:       Result Value   CO2 18 (*)    Glucose, Bld 188 (*)    BUN 25 (*)    Creatinine, Ser 1.58 (*)    Calcium 12.6 (*)    Total Protein 6.3 (*)    GFR calc non Af Amer 33 (*)    GFR calc Af Amer 38 (*)    All other components within normal limits  ACETAMINOPHEN LEVEL - Abnormal; Notable for the following:    Acetaminophen (Tylenol), Serum <10 (*)    All other components within normal limits  CBC  WITH DIFFERENTIAL/PLATELET - Abnormal; Notable for the following:    WBC 11.2 (*)    Neutro Abs 7.8 (*)    All other components within normal limits  MAGNESIUM - Abnormal; Notable for the following:    Magnesium 2.5 (*)    All other components within normal limits  CBG MONITORING, ED - Abnormal; Notable for the following:    Glucose-Capillary 210 (*)    All other components within normal limits  I-STAT ARTERIAL BLOOD  GAS, ED - Abnormal; Notable for the following:    pH, Arterial 7.328 (*)    pO2, Arterial 66.0 (*)    Bicarbonate 18.3 (*)    TCO2 19 (*)    Acid-base deficit 8.0 (*)    All other components within normal limits  I-STAT CHEM 8, ED - Abnormal; Notable for the following:    BUN 29 (*)    Creatinine, Ser 1.50 (*)    Glucose, Bld 190 (*)    Calcium, Ion 1.70 (*)    TCO2 21 (*)    All other components within normal limits  I-STAT CG4 LACTIC ACID, ED - Abnormal; Notable for the following:    Lactic Acid, Venous 4.00 (*)    All other components within normal limits  SALICYLATE LEVEL  ETHANOL  TROPONIN I  RAPID URINE DRUG SCREEN, HOSP PERFORMED  CBC  LACTIC ACID, PLASMA  TROPONIN I  CK  BLOOD GAS, ARTERIAL  BASIC METABOLIC PANEL  MAGNESIUM  PHOSPHORUS  RENAL FUNCTION PANEL  MAGNESIUM  I-STAT CHEM 8, ED  I-STAT TROPONIN, ED  CBG MONITORING, ED  CBG MONITORING, ED  I-STAT CHEM 8, ED  CBG MONITORING, ED    EKG  EKG Interpretation None       Radiology Dg Chest Port 1 View  Result Date: 05/19/2017 CLINICAL DATA:  Overdose EXAM: PORTABLE CHEST 1 VIEW COMPARISON:  05/19/2006 FINDINGS: Artifact overlies the chest. Heart size is normal. There is aortic atherosclerosis. Lungs are clear. The vascularity is normal. No effusions. No bone abnormalities. IMPRESSION: No active disease.  Aortic atherosclerosis. Electronically Signed   By: Nelson Chimes M.D.   On: 05/19/2017 17:46    Procedures Procedures (including critical care time)  CRITICAL CARE Performed by:  Varney Biles   Total critical care time: 130 minutes  Critical care time was exclusive of separately billable procedures and treating other patients.  Critical care was necessary to treat or prevent imminent or life-threatening deterioration.  Critical care was time spent personally by me on the following activities: development of treatment plan with patient and/or surrogate as well as nursing, discussions with consultants, evaluation of patient's response to treatment, examination of patient, obtaining history from patient or surrogate, ordering and performing treatments and interventions, ordering and review of laboratory studies, ordering and review of radiographic studies, pulse oximetry and re-evaluation of patient's condition.    EMERGENCY DEPARTMENT [place] CARDIAC EXAM "Study: Limited Ultrasound of the heart and pericardium"  INDICATIONS:overdose with hypotension Multiple views of the heart and pericardium were obtained in real-time with a multi-frequency probe.  PERFORMED ZT:[id number]  IMAGES ARCHIVED?: No  FINDINGS: No pericardial effusion, Decreased contractility and Tamponade physiology absent  LIMITATIONS:  Emergent procedure  VIEWS USED: Subcostal 4 chamber and Parasternal long axis  INTERPRETATION: Cardiac activity present, Pericardial effusioin absent, Cardiac tamponade absent, Probable low CVP, Decreased contractility and IVC flat  CPT Code: [phone] (limited transthoracic cardiac)    Medications Ordered in ED Medications  ondansetron (ZOFRAN) 4 MG/2ML injection (not administered)  glucagon (human recombinant) (GLUCAGEN) 5 mg in dextrose 5 % 50 mL (0.1 mg/mL) infusion (5 mg/hr Intravenous New Bag/Given 05/19/17 1948)  heparin injection 5,000 Units (not administered)  0.9 %  sodium chloride infusion (not administered)  fentaNYL (SUBLIMAZE) injection 25-50 mcg (not administered)  lactated ringers bolus 1,000 mL (1,000 mLs Intravenous New Bag/Given 05/19/17 1948)    EPINEPHrine (ADRENALIN) 4 mg in dextrose 5 % 250 mL (0.016 mg/mL) infusion (not administered)  sodium chloride 0.9 % 250 mL with  insulin regular (NOVOLIN R,HUMULIN R) 250 Units infusion ( Intravenous New Bag/Given 05/19/17 2051)  glucagon (GLUCAGEN)  IVPB (bolus for beta Neider/calcium channel bocker overdose) (0 mg Intravenous Stopped 05/19/17 1819)  ondansetron (ZOFRAN) injection 4 mg (4 mg Intravenous Given 05/19/17 1802)  midazolam (VERSED) injection 2 mg (2 mg Intravenous Given 05/19/17 1755)     Initial Impression / Assessment and Plan / ED Course  I have reviewed the triage vital signs and the nursing notes.  Pertinent labs & imaging results that were available during my care of the patient were reviewed by me and considered in my medical decision making (see chart for details).  Clinical Course as of May 19 2100  Wed May 19, 2017  1923 Spoke with Dr. Manuella Ghazi, toxicologist. He recommends that we consider stopping the pacer, get an EKG, give patient sodium bicarb and repeat EKG.  CCM is already at bedside, I have relayed the recommendations.  The bicarb is for QRS prolongation.  Poison control will also fax over the protocol for calcium channel [place] overdose  [AN]  1924 CCM does not want to stop the pacemaker at this time.  They recommend getting cardiology on board for transvenous pacing. Bedside echo was performed by me and by the intensivist, patient has a fairly decent EF.  BP has dropped.  Cardiology has been consulted.  CCM noted that the IVC was collapsed, therefore they are recommending another fluid bolus.  [AN]    Clinical Course User Index [AN] Varney Biles, MD    Patient arrives to the ER with chief complaint of altered mental status, symptomatic bradycardia. It is presumed right now that patient might have overdosed on her medications.  Patient is altered, but does admit to overdose. Other possibilities include ACS, sick sinus syndrome, stroke.  At arrival patient  is protecting her airway, pulse ox is 92% on 5 L of nasal cannula, skin is cool to touch but the blood pressure appears to be normal and patient has intact central pulses.  -Patient received calcium chloride 1 g, Versed total 5 gram and 2 mg of glucagon. -We ordered a fluid bolus, calcium chloride, glucagon in the ER.  We also initiated high-dose insulin drip.  Poison control has been consulted. Family has been contacted over the phone and then updated multiple times after their arrival.  Final Clinical Impressions(s) / ED Diagnoses   Final diagnoses:  Suicide attempt [facility])  Intentional drug overdose, initial encounter ([place])  Shock circulatory ([place])  Symptomatic bradycardia    New Prescriptions New Prescriptions   No medications on file     Varney Biles, MD 05/20/17 1348

## 2017-05-19 NOTE — Consult Note (Signed)
Cardiology Consult    Patient ID: Betsy Rosello Soden MRN: [id number], DOB/AGE: [birth year]   Admit date: 05/19/2017 Date of Consult: 05/19/2017  Primary Physician: Jefm Petty, MD Primary Cardiologist: New Requesting Provider: Kathrynn Humble Reason for Consultation: Bradycardia  Nolon Stalls Hepburn is a 66 y.o. female who is being seen today for the evaluation of bradycardia at the request of Dr. Concepcion Living & Dr. Halford Chessman.   Patient Profile    66 yo female with PMH of HTN, HL, DM who presented with AMS and bradycardia.   Past Medical History   Past Medical History:  Diagnosis Date  . Complication of anesthesia    some difficulty waking up after tubes tied  . Diabetes mellitus without complication ([name])    diet controlled  . Headache    migraines years ago  . Hypertension   . Irritable bowel syndrome    under control  . Neuropathic pain   . Pneumonia    as a child  . Sleep apnea    does not use C_pap    Past Surgical History:  Procedure Laterality Date  . COLONOSCOPY N/A 10/29/2015   Procedure: COLONOSCOPY;  Surgeon: Clarene Essex, MD;  Location: [facility];  Service: Endoscopy;  Laterality: N/A;  . DILATION AND CURETTAGE OF UTERUS    . FOOT SURGERY Left   . GASTRIC BYPASS    . ORIF TIBIA PLATEAU Left 10/19/2014   Procedure: OPEN REDUCTION INTERNAL FIXATION (ORIF) LEFT BICONDYLAR TIBIAL PLATEAU;  Surgeon: Leandrew Koyanagi, MD;  Location: [facility];  Service: Orthopedics;  Laterality: Left;  . TUBAL LIGATION       Allergies  Allergies  Allergen Reactions  . Sulfa Antibiotics Other (See Comments)    Pt. Does not recall the reaction   . Trazodone Other (See Comments)    Visual Disturbances as well as Tingling sensation  . Zolpidem Tartrate Other (See Comments)    Altered mental status     History of Present Illness    Mrs. Fonda is a 47 with PMH of HTN, HL, DM who presented with AMS from home. She was brought to the ED after her husband found her at home unresponsive on the  sofa. Reported when he went up stairs he found several open pill bottles on her bed. Multiple bottles that included her home blood pressure medications of amlodipine and lisinopril. On EMS arrival HR was in the 20-30s. She was given Ca Chloride, glucagon, and was externally paced.   Per her husband, he is unsure whether this was an intentional overdose, no hx of SI in the past.   In the ED she is still requiring temp pacing with external pads. In review of EKG strips from EMS appears to be sinus bradycardia. She was started on IV fluids and insulin. Initially blood pressure was stable, but now soft in the 99I systolic. A-line in place. She does to verbal stimuli, able to state her name.    Inpatient Medications    . heparin  5,000 Units Subcutaneous Q8H  . ondansetron        Family History    Family History  Problem Relation Age of Onset  . Cancer Mother   . Cancer Other   . COPD Other   . Diabetes type II Neg Hx     Social History    Social History   Social History  . Marital status: Married    Spouse name: N/A  . Number of children: N/A  . Years of education: N/A  Occupational History  . Not on file.   Social History Main Topics  . Smoking status: Current Every Day Smoker    Packs/day: 1.00    Years: 41.00    Types: Cigarettes  . Smokeless tobacco: Never Used  . Alcohol use No  . Drug use: No  . Sexual activity: Not on file   Other Topics Concern  . Not on file   Social History Narrative  . No narrative on file     Review of Systems    See HPI  All other systems reviewed and are otherwise negative except as noted above.  Physical Exam    Blood pressure (!) 83/64, pulse (!) 43, temperature (!) 94.6 F (34.8 C), temperature source Tympanic, resp. rate 16, height 5\' 6"  (1.676 m), weight 170 lb (77.1 kg), SpO2 100 %.  General: Lethargic, responds to verbal stimuli Neuro: Alert to self HEENT: Normal  Neck: Supple without bruits or JVD. Lungs:  Resp  regular and unlabored, CTA. Heart: bradycardic, no s3, s4, or murmurs. Abdomen: Soft, non-tender, non-distended, BS + x 4.  Extremities: No clubbing, cyanosis or edema. DP/PT/Radials 2+ and equal bilaterally.  Labs    Troponin (Point of Care Test) No results for input(s): TROPIPOC in the last 72 hours.  Recent Labs  05/19/17 1730  TROPONINI <0.03   Lab Results  Component Value Date   WBC 11.2 (H) 05/19/2017   HGB 12.9 05/19/2017   HCT 38.0 05/19/2017   MCV 84.5 05/19/2017   PLT 219 05/19/2017    Recent Labs Lab 05/19/17 1730 05/19/17 1754  NA 136 139  K 4.2 4.1  CL 107 110  CO2 18*  --   BUN 25* 29*  CREATININE 1.58* 1.50*  CALCIUM 12.6*  --   PROT 6.3*  --   BILITOT 0.4  --   ALKPHOS 49  --   ALT 28  --   AST 33  --   GLUCOSE 188* 190*   Lab Results  Component Value Date   CHOL 195 08/01/2012   HDL 34 (L) 08/01/2012   LDLCALC 136 (H) 08/01/2012   TRIG 126 08/01/2012   No results found for: [facility]   Radiology Studies    Dg Chest Port 1 View  Result Date: 05/19/2017 CLINICAL DATA:  Overdose EXAM: PORTABLE CHEST 1 VIEW COMPARISON:  05/19/2006 FINDINGS: Artifact overlies the chest. Heart size is normal. There is aortic atherosclerosis. Lungs are clear. The vascularity is normal. No effusions. No bone abnormalities. IMPRESSION: No active disease.  Aortic atherosclerosis. Electronically Signed   By: Nelson Chimes M.D.   On: 05/19/2017 17:46    ECG & Cardiac Imaging    EKG: No EKG in the ED, EMS strips show SB rate in the 30s.    Assessment & Plan    66 yo female with PMH of HTN, HL, DM who presented with AMS and bradycardia.   1. Profound Bradycardia: Currently externally pacing in the ED. Blood pressures are soft, but somewhat responsive to IVFs. Given Ca Chloride and glucagon en route, IV insulin in the ED. Unclear which medications she took as the bottles were lost in routine. Will need a temp wire placed. Discussed with on-call MD.   2. AMS: suspect  2/2 to intentional overdose on home blood pressure medications. Will ultimately need psych eval.    3. Hypotension: blood pressures are labile. Currently with systolic pressure in the 62Z. PCCM attempting resuscitation with IVFs. Considered dopamine, but question in the ER about this  being contradicted in CCB overdose? Will defer to MD regarding further therapy.    Barnet Pall, NP-C Pager [phone] 05/19/2017, 8:07 PM    I have seen, examined and evaluated the patient this PM along with Reino Bellis, NP-C.  After reviewing all the available data and chart, we discussed the patients laboratory, study & physical findings as well as symptoms in detail. I agree with her findings, examination as well as impression recommendations as per our discussion.    66 year old woman with suspected suicide attempt using multiple medication overdose.  She is now currently in borderline shock with profound symptom medic bradycardia with rates in the 30s.  Inadequate capture with transcutaneous pacing.  I was asked to assist with temporary pacemaker placement as a bridging therapy to allow for medicine detoxification.  Informed consent was obtained from the patient's husband.  The patient does have a DNR order, but this is void in the setting of suicide attempt.    Glenetta Hew, M.D., M.S. Interventional Cardiologist   Pager # [phone] Phone # [phone] [address]. [place], [place] [address]

## 2017-05-20 ENCOUNTER — Inpatient Hospital Stay (HOSPITAL_COMMUNITY): Payer: Medicare Other

## 2017-05-20 ENCOUNTER — Encounter (HOSPITAL_COMMUNITY): Payer: Self-pay | Admitting: Cardiology

## 2017-05-20 DIAGNOSIS — M549 Dorsalgia, unspecified: Secondary | ICD-10-CM

## 2017-05-20 DIAGNOSIS — T1491XA Suicide attempt, initial encounter: Secondary | ICD-10-CM

## 2017-05-20 DIAGNOSIS — T461X2A Poisoning by calcium-channel blockers, intentional self-harm, initial encounter: Principal | ICD-10-CM

## 2017-05-20 DIAGNOSIS — T461X1A Poisoning by calcium-channel blockers, accidental (unintentional), initial encounter: Secondary | ICD-10-CM

## 2017-05-20 DIAGNOSIS — I340 Nonrheumatic mitral (valve) insufficiency: Secondary | ICD-10-CM

## 2017-05-20 DIAGNOSIS — T46901A Poisoning by unspecified agents primarily affecting the cardiovascular system, accidental (unintentional), initial encounter: Secondary | ICD-10-CM

## 2017-05-20 DIAGNOSIS — F17210 Nicotine dependence, cigarettes, uncomplicated: Secondary | ICD-10-CM

## 2017-05-20 DIAGNOSIS — T46902A Poisoning by unspecified agents primarily affecting the cardiovascular system, intentional self-harm, initial encounter: Secondary | ICD-10-CM

## 2017-05-20 LAB — POCT I-STAT, CHEM 8
BUN: 26 mg/dL — ABNORMAL HIGH (ref 6–20)
BUN: 30 mg/dL — AB (ref 6–20)
CALCIUM ION: 1.39 mmol/L (ref 1.15–1.40)
CHLORIDE: 111 mmol/L (ref 101–111)
CREATININE: 1.2 mg/dL — AB (ref 0.44–1.00)
CREATININE: 1.6 mg/dL — AB (ref 0.44–1.00)
Calcium, Ion: 1.56 mmol/L (ref 1.15–1.40)
Chloride: 104 mmol/L (ref 101–111)
GLUCOSE: 226 mg/dL — AB (ref 65–99)
Glucose, Bld: 573 mg/dL (ref 65–99)
HEMATOCRIT: 33 % — AB (ref 36.0–46.0)
HEMATOCRIT: 33 % — AB (ref 36.0–46.0)
HEMOGLOBIN: 11.2 g/dL — AB (ref 12.0–15.0)
Hemoglobin: 11.2 g/dL — ABNORMAL LOW (ref 12.0–15.0)
POTASSIUM: 3.3 mmol/L — AB (ref 3.5–5.1)
Potassium: 3.3 mmol/L — ABNORMAL LOW (ref 3.5–5.1)
Sodium: 137 mmol/L (ref 135–145)
Sodium: 135 mmol/L (ref 135–145)
TCO2: 19 mmol/L — ABNORMAL LOW (ref 22–32)
TCO2: 14 mmol/L — AB (ref 22–32)

## 2017-05-20 LAB — CBC
HCT: 34.2 % — ABNORMAL LOW (ref 36.0–46.0)
Hemoglobin: 11.3 g/dL — ABNORMAL LOW (ref 12.0–15.0)
MCH: 27.2 pg (ref 26.0–34.0)
MCHC: 33 g/dL (ref 30.0–36.0)
MCV: 82.4 fL (ref 78.0–100.0)
Platelets: 248 10*3/uL (ref 150–400)
RBC: 4.15 MIL/uL (ref 3.87–5.11)
RDW: 13.7 % (ref 11.5–15.5)
WBC: 24.5 10*3/uL — AB (ref 4.0–10.5)

## 2017-05-20 LAB — GLUCOSE, CAPILLARY
GLUCOSE-CAPILLARY: 331 mg/dL — AB (ref 65–99)
GLUCOSE-CAPILLARY: 452 mg/dL — AB (ref 65–99)
GLUCOSE-CAPILLARY: 491 mg/dL — AB (ref 65–99)
GLUCOSE-CAPILLARY: 509 mg/dL — AB (ref 65–99)
GLUCOSE-CAPILLARY: 461 mg/dL — AB (ref 65–99)
GLUCOSE-CAPILLARY: 236 mg/dL — AB (ref 65–99)
GLUCOSE-CAPILLARY: 392 mg/dL — AB (ref 65–99)
GLUCOSE-CAPILLARY: 378 mg/dL — AB (ref 65–99)
GLUCOSE-CAPILLARY: 328 mg/dL — AB (ref 65–99)
Glucose-Capillary: 236 mg/dL — ABNORMAL HIGH (ref 65–99)
Glucose-Capillary: 282 mg/dL — ABNORMAL HIGH (ref 65–99)
Glucose-Capillary: 303 mg/dL — ABNORMAL HIGH (ref 65–99)
Glucose-Capillary: 317 mg/dL — ABNORMAL HIGH (ref 65–99)
Glucose-Capillary: 390 mg/dL — ABNORMAL HIGH (ref 65–99)
Glucose-Capillary: 391 mg/dL — ABNORMAL HIGH (ref 65–99)
Glucose-Capillary: 434 mg/dL — ABNORMAL HIGH (ref 65–99)
Glucose-Capillary: 460 mg/dL — ABNORMAL HIGH (ref 65–99)
Glucose-Capillary: 502 mg/dL (ref 65–99)
Glucose-Capillary: 518 mg/dL (ref 65–99)

## 2017-05-20 LAB — BASIC METABOLIC PANEL
ANION GAP: 15 (ref 5–15)
Anion gap: 11 (ref 5–15)
Anion gap: 7 (ref 5–15)
Anion gap: 6 (ref 5–15)
BUN: 32 mg/dL — AB (ref 6–20)
BUN: 32 mg/dL — AB (ref 6–20)
BUN: 29 mg/dL — AB (ref 6–20)
BUN: 25 mg/dL — AB (ref 6–20)
CHLORIDE: 105 mmol/L (ref 101–111)
CHLORIDE: 105 mmol/L (ref 101–111)
CHLORIDE: 105 mmol/L (ref 101–111)
CHLORIDE: 108 mmol/L (ref 101–111)
CO2: 12 mmol/L — AB (ref 22–32)
CO2: 16 mmol/L — ABNORMAL LOW (ref 22–32)
CO2: 20 mmol/L — AB (ref 22–32)
CO2: 21 mmol/L — ABNORMAL LOW (ref 22–32)
Calcium: 9.3 mg/dL (ref 8.9–10.3)
Calcium: 8.9 mg/dL (ref 8.9–10.3)
Calcium: 8.7 mg/dL — ABNORMAL LOW (ref 8.9–10.3)
Calcium: 8.5 mg/dL — ABNORMAL LOW (ref 8.9–10.3)
Creatinine, Ser: 1.87 mg/dL — ABNORMAL HIGH (ref 0.44–1.00)
Creatinine, Ser: 1.8 mg/dL — ABNORMAL HIGH (ref 0.44–1.00)
Creatinine, Ser: 1.63 mg/dL — ABNORMAL HIGH (ref 0.44–1.00)
Creatinine, Ser: 1.54 mg/dL — ABNORMAL HIGH (ref 0.44–1.00)
GFR calc Af Amer: 31 mL/min — ABNORMAL LOW (ref >60)
GFR calc Af Amer: 32 mL/min — ABNORMAL LOW (ref >60)
GFR calc Af Amer: 37 mL/min — ABNORMAL LOW (ref >60)
GFR calc Af Amer: 39 mL/min — ABNORMAL LOW (ref >60)
GFR calc non Af Amer: 28 mL/min — ABNORMAL LOW (ref >60)
GFR calc non Af Amer: 32 mL/min — ABNORMAL LOW (ref >60)
GFR calc non Af Amer: 34 mL/min — ABNORMAL LOW (ref >60)
GFR, EST NON AFRICAN AMERICAN: 27 mL/min — AB (ref >60)
GLUCOSE: 482 mg/dL — AB (ref 65–99)
GLUCOSE: 566 mg/dL — AB (ref 65–99)
GLUCOSE: 475 mg/dL — AB (ref 65–99)
GLUCOSE: 379 mg/dL — AB (ref 65–99)
POTASSIUM: 2.8 mmol/L — AB (ref 3.5–5.1)
POTASSIUM: 3.6 mmol/L (ref 3.5–5.1)
POTASSIUM: 3.5 mmol/L (ref 3.5–5.1)
POTASSIUM: 3.7 mmol/L (ref 3.5–5.1)
Sodium: 132 mmol/L — ABNORMAL LOW (ref 135–145)
Sodium: 132 mmol/L — ABNORMAL LOW (ref 135–145)
Sodium: 132 mmol/L — ABNORMAL LOW (ref 135–145)
Sodium: 135 mmol/L (ref 135–145)

## 2017-05-20 LAB — RENAL FUNCTION PANEL
ALBUMIN: 2.8 g/dL — AB (ref 3.5–5.0)
ANION GAP: 9 (ref 5–15)
BUN: 30 mg/dL — AB (ref 6–20)
CALCIUM: 9.7 mg/dL (ref 8.9–10.3)
CO2: 15 mmol/L — AB (ref 22–32)
CREATININE: 1.61 mg/dL — AB (ref 0.44–1.00)
Chloride: 111 mmol/L (ref 101–111)
GFR, EST AFRICAN AMERICAN: 37 mL/min — AB (ref >60)
GFR, EST NON AFRICAN AMERICAN: 32 mL/min — AB (ref >60)
GLUCOSE: 308 mg/dL — AB (ref 65–99)
Phosphorus: 1.9 mg/dL — ABNORMAL LOW (ref 2.5–4.6)
Potassium: 2.2 mmol/L — CL (ref 3.5–5.1)
SODIUM: 135 mmol/L (ref 135–145)

## 2017-05-20 LAB — POCT I-STAT 3, ART BLOOD GAS (G3+)
Acid-base deficit: 6 mmol/L — ABNORMAL HIGH (ref 0.0–2.0)
BICARBONATE: 18.4 mmol/L — AB (ref 20.0–28.0)
O2 Saturation: 95 %
PCO2 ART: 30.5 mmHg — AB (ref 32.0–48.0)
PH ART: 7.379 (ref 7.350–7.450)
Patient temperature: 34.9
TCO2: 19 mmol/L — AB (ref 22–32)
pO2, Arterial: 71 mmHg — ABNORMAL LOW (ref 83.0–108.0)

## 2017-05-20 LAB — MAGNESIUM
Magnesium: 1.7 mg/dL (ref 1.7–2.4)
Magnesium: 1.6 mg/dL — ABNORMAL LOW (ref 1.7–2.4)
Magnesium: 1.6 mg/dL — ABNORMAL LOW (ref 1.7–2.4)

## 2017-05-20 LAB — PHOSPHORUS
PHOSPHORUS: 2.4 mg/dL — AB (ref 2.5–4.6)
Phosphorus: 2 mg/dL — ABNORMAL LOW (ref 2.5–4.6)

## 2017-05-20 LAB — LACTIC ACID, PLASMA: Lactic Acid, Venous: 6.6 mmol/L (ref 0.5–1.9)

## 2017-05-20 LAB — MRSA PCR SCREENING: MRSA BY PCR: NEGATIVE

## 2017-05-20 LAB — TROPONIN I: TROPONIN I: 0.04 ng/mL — AB (ref <0.03)

## 2017-05-20 MED ORDER — CHLORHEXIDINE GLUCONATE CLOTH 2 % EX PADS
6.0000 | MEDICATED_PAD | Freq: Every day | CUTANEOUS | Status: DC
  Administered 2017-05-20 – 2017-05-24 (×5): 6 via TOPICAL

## 2017-05-20 MED ORDER — NOREPINEPHRINE BITARTRATE 1 MG/ML IV SOLN
0.0000 ug/min | INTRAVENOUS | Status: DC
  Administered 2017-05-20 – 2017-05-21 (×3): 40 ug/min via INTRAVENOUS
  Filled 2017-05-20 (×5): qty 16

## 2017-05-20 MED ORDER — ORAL CARE MOUTH RINSE
15.0000 mL | Freq: Two times a day (BID) | OROMUCOSAL | Status: DC
  Administered 2017-05-20 – 2017-05-22 (×5): 15 mL via OROMUCOSAL

## 2017-05-20 MED ORDER — KCL-LACTATED RINGERS 20 MEQ/L IV SOLN
INTRAVENOUS | Status: DC
Start: 2017-05-20 — End: 2017-05-22
  Administered 2017-05-20 – 2017-05-22 (×7): via INTRAVENOUS
  Filled 2017-05-20 (×11): qty 1000

## 2017-05-20 MED ORDER — SODIUM CHLORIDE 0.9% FLUSH
10.0000 mL | Freq: Two times a day (BID) | INTRAVENOUS | Status: DC
  Administered 2017-05-20 – 2017-05-23 (×4): 10 mL

## 2017-05-20 MED ORDER — WHITE PETROLATUM EX OINT
TOPICAL_OINTMENT | CUTANEOUS | Status: DC | PRN
  Filled 2017-05-20: qty 28.35

## 2017-05-20 MED ORDER — SODIUM CHLORIDE 0.9% FLUSH: 10.0000 mL | INTRAVENOUS | Status: DC | PRN

## 2017-05-20 MED ORDER — CHLORHEXIDINE GLUCONATE 0.12 % MT SOLN
15.0000 mL | Freq: Two times a day (BID) | OROMUCOSAL | Status: DC
  Administered 2017-05-20 – 2017-05-22 (×6): 15 mL via OROMUCOSAL
  Filled 2017-05-20 (×4): qty 15

## 2017-05-20 MED ORDER — NOREPINEPHRINE BITARTRATE 1 MG/ML IV SOLN
0.0000 ug/min | INTRAVENOUS | Status: DC
  Administered 2017-05-20: 5 ug/min via INTRAVENOUS
  Filled 2017-05-20: qty 4

## 2017-05-20 MED ORDER — BLISTEX MEDICATED EX OINT
TOPICAL_OINTMENT | CUTANEOUS | Status: DC | PRN
  Filled 2017-05-20: qty 6.3

## 2017-05-20 MED ORDER — INFLUENZA VAC SPLIT HIGH-DOSE 0.5 ML IM SUSY
0.5000 mL | PREFILLED_SYRINGE | INTRAMUSCULAR | Status: AC
Start: 2017-05-21 — End: 2017-05-24
  Administered 2017-05-24: 0.5 mL via INTRAMUSCULAR
  Filled 2017-05-20: qty 0.5

## 2017-05-20 MED ORDER — SODIUM CHLORIDE 0.9 % IV SOLN: INTRAVENOUS | Status: DC

## 2017-05-20 MED ORDER — PERFLUTREN LIPID MICROSPHERE
1.0000 mL | INTRAVENOUS | Status: AC | PRN
  Administered 2017-05-20: 3 mL via INTRAVENOUS
  Filled 2017-05-20 (×2): qty 10

## 2017-05-20 MED ORDER — STERILE WATER FOR INJECTION IV SOLN
INTRAVENOUS | Status: DC
  Administered 2017-05-20 – 2017-05-21 (×3): via INTRAVENOUS
  Filled 2017-05-20 (×4): qty 850

## 2017-05-20 MED ORDER — POTASSIUM CHLORIDE 10 MEQ/50ML IV SOLN
10.0000 meq | INTRAVENOUS | Status: AC
  Administered 2017-05-20 (×6): 10 meq via INTRAVENOUS
  Filled 2017-05-20 (×6): qty 50

## 2017-05-20 NOTE — Progress Notes (Signed)
[facility] Progress Note Patient Name: Maddelyn Rocca Bancroft DOB: 14-Sep-1950 MRN: [id number]   Date of Service  05/20/2017  HPI/Events of Note  Notified by bedside nurse of ongoing shock. Patient also with hypokalemia and serum calcium 9.7. Serum magnesium 1.7. Creatinine 1.6. Has central venous access. Currently mildly acidotic. Epinephrine infusion at maximum rate.   eICU Interventions  1.  Replacing with IV potassium chloride 10 mEq 6 runs 2. Switching maintenance IV fluid to LR with 20 of KCl at 125 mL per hour 3. Monitoring electrolytes closely  4. Continuous telemetry monitoring      Intervention Category Major Interventions: Electrolyte abnormality - evaluation and management;Shock - evaluation and management  [name] 05/20/2017, 2:30 AM

## 2017-05-20 NOTE — Progress Notes (Signed)
   05/20/17 2426  Clinical Encounter Type  Visited With Patient;Health care provider  Visit Type Initial  Referral From Nurse  Consult/Referral To Chaplain  Spiritual Encounters  Spiritual Needs Emotional  Stress Factors  Patient Stress Factors Health changes;Exhausted   Visited with Patient and healthcare providers (Nurses and sitter)  Responded to a [facility] for the patient with suicidal thoughts.  Patient was mostly alert.  Talked about family and I asked why she came to the hospital.  She indicated she took almost all her pills.  She talked about her pain in her legs and back that has been on going.  I said, I guess you want the pain to stop, she looked right at me and said, yes.  She expressed she was tired and wanted to sleep, so we stopped the conversation.  Will follow as needed. Chaplain Katherene Ponto

## 2017-05-20 NOTE — Progress Notes (Signed)
[facility] Progress Note Patient Name: Rachel White DOB: [birth year] MRN: [id number]   Date of Service  05/20/2017  HPI/Events of Note  Bedside point-of-care echo with decreased contractility on limited windows. Glucose rising with glucagon infusion and maximal dose drip rate of quadruple strength Levophed. Glucose temp greater than 500.   eICU Interventions  1. Increasing insulin drip rate to 100 mL per hour 2. Continuing to monitor glucose every hour      Intervention Category Major Interventions: Shock - evaluation and management;Other:  [name] 05/20/2017, 6:12 AM

## 2017-05-20 NOTE — Progress Notes (Signed)
Progress Note  Patient Name: Rachel White Date of Encounter: 05/20/2017  Primary Cardiologist:   New.  (Dr. Ellyn Hack)  Subjective   No pain or SOB.   Inpatient Medications    Scheduled Meds: . heparin  5,000 Units Subcutaneous Q8H   Continuous Infusions: . sodium chloride    . epinephrine 10 mcg/min (05/20/17 0544)  . glucagon (GLUCAGEN) infusion (for beta Zadrozny/calcium channel Shipper overdose) 5 mg/hr (05/20/17 0544)  . lactated ringers with KCl 20 mEq/L 125 mL/hr at 05/20/17 0328  . norepinephrine (LEVOPHED) Adult infusion 40 mcg/min (05/20/17 0444)  .  sodium bicarbonate (isotonic) infusion in sterile water 75 mL/hr at 05/20/17 0349  . sodium chloride 0.9 % 250 mL with insulin regular (NOVOLIN R,HUMULIN R) 250 Units infusion 100 mL/hr at 05/20/17 0745   PRN Meds: sodium chloride, acetaminophen, fentaNYL (SUBLIMAZE) injection, ondansetron (ZOFRAN) IV   Vital Signs    Vitals:   05/20/17 0615 05/20/17 0630 05/20/17 0700 05/20/17 0800  BP:   (!) 92/41 (!) 95/43  Pulse: 86 80 83 81  Resp: 17 15 17 16   Temp: 99.5 F (37.5 C) 99.7 F (37.6 C) 99.5 F (37.5 C) 99.9 F (37.7 C)  TempSrc:      SpO2: 98% 97% 99% 99%  Weight:      Height:        Intake/Output Summary (Last 24 hours) at 05/20/17 0906 Last data filed at 05/20/17 0947  Gross per 24 hour  Intake          5724.12 ml  Output              200 ml  Net          5524.12 ml   Filed Weights   05/19/17 1727  Weight: 170 lb (77.1 kg)    Telemetry    NSR - Personally Reviewed  ECG    NA - Personally Reviewed  Physical Exam   GEN: No acute distress.   Neck: No  JVD Cardiac: RRR, 2/6 apical brief systolic murmur, no diastolic murmurs, rubs, or gallops.  Respiratory: Clear  to auscultation bilaterally. GI: Soft, nontender, non-distended  MS: No  edema; No deformity. Neuro:  Nonfocal  Psych: Normal affect   Labs    Chemistry Recent Labs Lab 05/19/17 1730  05/19/17 2250  05/20/17 0134  05/20/17 0557 05/20/17 0809  NA 136  < >  --   < > 135 132* 135  K 4.2  < >  --   < > 2.2* 2.8* 3.3*  CL 107  < >  --   < > 111 105 104  CO2 18*  --   --   --  15* 12*  --   GLUCOSE 188*  < >  --   < > 308* 482* 573*  BUN 25*  < >  --   < > 30* 32* 30*  CREATININE 1.58*  < > 1.31*  < > 1.61* 1.87* 1.60*  CALCIUM 12.6*  --   --   --  9.7 9.3  --   PROT 6.3*  --   --   --   --   --   --   ALBUMIN 3.6  --   --   --  2.8*  --   --   AST 33  --   --   --   --   --   --   ALT 28  --   --   --   --   --   --  ALKPHOS 49  --   --   --   --   --   --   BILITOT 0.4  --   --   --   --   --   --   GFRNONAA 33*  --  41*  --  32* 27*  --   GFRAA 38*  --  48*  --  37* 31*  --   ANIONGAP 11  --   --   --  9 15  --   < > = values in this interval not displayed.   Hematology Recent Labs Lab 05/19/17 1730  05/19/17 2250 05/19/17 2302 05/20/17 0557 05/20/17 0809  WBC 11.2*  --  11.7*  --  24.5*  --   RBC 4.64  --  4.32  --  4.15  --   HGB 12.8  < > 11.8* 11.2* 11.3* 11.2*  HCT 39.2  < > 35.4* 33.0* 34.2* 33.0*  MCV 84.5  --  81.9  --  82.4  --   MCH 27.6  --  27.3  --  27.2  --   MCHC 32.7  --  33.3  --  33.0  --   RDW 14.0  --  13.7  --  13.7  --   PLT 219  --  188  --  248  --   < > = values in this interval not displayed.  Cardiac Enzymes Recent Labs Lab 05/19/17 1730 05/20/17 0135  TROPONINI <0.03 0.04*   No results for input(s): TROPIPOC in the last 168 hours.   BNPNo results for input(s): BNP, PROBNP in the last 168 hours.   DDimer No results for input(s): DDIMER in the last 168 hours.   Radiology    Dg Chest Port 1 View  Result Date: 05/19/2017 CLINICAL DATA:  Overdose EXAM: PORTABLE CHEST 1 VIEW COMPARISON:  05/19/2006 FINDINGS: Artifact overlies the chest. Heart size is normal. There is aortic atherosclerosis. Lungs are clear. The vascularity is normal. No effusions. No bone abnormalities. IMPRESSION: No active disease.  Aortic atherosclerosis. Electronically Signed    By: Nelson Chimes M.D.   On: 05/19/2017 17:46    Cardiac Studies   NA  Patient Profile     66 y.o. female patient without cardiac history presented with profound bradycardia and hypotension after intentional Norvasc overdose.  Assessment & Plan    BRADYCARDIA/SHOCK:   Improved.   No pacing this morning.  Likely pull out pacing wires after noon.  Weaning pressors as tolerated.    MURMUR:  Likely flow murmur.  Will check echo.     Signed, Minus Breeding, MD  05/20/2017, 9:06 AM

## 2017-05-20 NOTE — Progress Notes (Signed)
Dr Halford Chessman aware of latest B met. No new orders received.

## 2017-05-20 NOTE — Progress Notes (Signed)
[facility] Progress Note Patient Name: Rachel White DOB: [birth year] MRN: [id number]   Date of Service  05/20/2017  HPI/Events of Note  Female admitted with calcium channel Schinke overdose. Currently on maximum dose rate epinephrine & insulin drip at 77 units per hour. Remains hypotensive. Metabolic acidosis worsening. Currently undergoing electrolyte replacement. Case discussed with interventional cardiologist & toxicologist. Concern for possible lack of cardiac contractility. Currently patient is not requiring pacer.   eICU Interventions  1. Switching to quadruple strength Levophed 2. Starting bicarbonate drip at 75 mL per hour 3. Continuing insulin drip and epinephrine drip at current rates 4. Checking point-of-care bedside echo to evaluate cardiac contractility 5. Plan for formal echocardiogram in a.m.      Intervention Category Major Interventions: Arrhythmia - evaluation and management;Shock - evaluation and management  [name] 05/20/2017, 3:29 AM

## 2017-05-20 NOTE — Progress Notes (Signed)
PULMONARY / CRITICAL CARE MEDICINE   Name: Rachel White MRN: [id number] DOB: [birth year]    ADMISSION DATE:  05/19/2017 CONSULTATION DATE:  05/19/17  REFERRING MD:  Kathrynn Humble  CHIEF COMPLAINT:  AMS after calcium channel overdose  HISTORY OF PRESENT ILLNESS:   66 yo female smoker found unresponsive at home by family, and found to have empty pill bottles including amlodipine.  She was found to have heart rate of 20.  SUBJECTIVE:  Being externally paced at 70bpm.  Opens eyes to noxious stimuli.  Per RN was able to answer basic questions earlier, but currently not answering any.  VITAL SIGNS: BP (!) 95/43   Pulse 81   Temp 99.9 F (37.7 C)   Resp 16   Ht 5\' 6"  (1.676 m)   Wt 170 lb (77.1 kg)   SpO2 99%   BMI 27.44 kg/m   INTAKE / OUTPUT: I/O last 3 completed shifts: In: 5724.1 [I.V.:5424.1; IV Piggyback:300] Out: 200 [Urine:200]   PHYSICAL EXAMINATION:  General - alert Eyes - pupils reactive ENT - no sinus tenderness, no oral exudate, no LAN Cardiac - regular, no murmur Chest - no wheeze, rales Abd - soft, non tender Ext - no edema Skin - no rashes Neuro - normal strength   LABS:  BMET  Recent Labs Lab 05/19/17 1730  05/20/17 0134 05/20/17 0557 05/20/17 0809  NA 136  < > 135 132* 135  K 4.2  < > 2.2* 2.8* 3.3*  CL 107  < > 111 105 104  CO2 18*  --  15* 12*  --   BUN 25*  < > 30* 32* 30*  CREATININE 1.58*  < > 1.61* 1.87* 1.60*  GLUCOSE 188*  < > 308* 482* 573*  < > = values in this interval not displayed.  Electrolytes  Recent Labs Lab 05/19/17 1730 05/20/17 0134 05/20/17 0135 05/20/17 0557  CALCIUM 12.6* 9.7  --  9.3  MG 2.5*  --  1.7 1.6*  PHOS  --  1.9*  --  2.0*    CBC  Recent Labs Lab 05/19/17 1730  05/19/17 2250 05/19/17 2302 05/20/17 0557 05/20/17 0809  WBC 11.2*  --  11.7*  --  24.5*  --   HGB 12.8  < > 11.8* 11.2* 11.3* 11.2*  HCT 39.2  < > 35.4* 33.0* 34.2* 33.0*  PLT 219  --  188  --  248  --   < > = values in  this interval not displayed.  Coag's No results for input(s): APTT, INR in the last 168 hours.  Sepsis Markers  Recent Labs Lab 05/19/17 1754 05/20/17 0557  LATICACIDVEN 4.00* 6.6*    ABG  Recent Labs Lab 05/19/17 1743 05/19/17 2257  PHART 7.328* 7.379  PCO2ART 34.1 30.5*  PO2ART 66.0* 71.0*    Liver Enzymes  Recent Labs Lab 05/19/17 1730 05/20/17 0134  AST 33  --   ALT 28  --   ALKPHOS 49  --   BILITOT 0.4  --   ALBUMIN 3.6 2.8*    Cardiac Enzymes  Recent Labs Lab 05/19/17 1730 05/20/17 0135  TROPONINI <0.03 0.04*    Glucose  Recent Labs Lab 05/20/17 0147 05/20/17 0247 05/20/17 0351 05/20/17 0447 05/20/17 0548 05/20/17 0653  GLUCAP 282* 331* 390* 434* 518* 452*    Imaging Dg Chest Port 1 View  Result Date: 05/19/2017 CLINICAL DATA:  Overdose EXAM: PORTABLE CHEST 1 VIEW COMPARISON:  05/19/2006 FINDINGS: Artifact overlies the chest. Heart size is normal. There  is aortic atherosclerosis. Lungs are clear. The vascularity is normal. No effusions. No bone abnormalities. IMPRESSION: No active disease.  Aortic atherosclerosis. Electronically Signed   By: Nelson Chimes M.D.   On: 05/19/2017 17:46     STUDIES:   SIGNIFICANT EVENTS: 10/31 admit, temporary pacer inserted  LINES/TUBES: Rt femoral sheath 10/31 >>   DISCUSSION: 66 y.o. female admitted 10/31 after presumed overdose of calcium channel blockers, presumed intentional (husband unsure).  ASSESSMENT / PLAN:  Overdose of calcium channel [place] with resulting cardiogenic shock. - pressors to keep MAP > 60 - pacer per cardiology - continue glucagon with insulin gtt - f/u Echo  Anion gap metabolic acidosis with lactic acidosis. Acute renal failure with ATN. Hypokalemia, hypophosphatemia, hypomagnesemia. - f/u BMET - replace electrolytes as needed - continue HCO3 in IV fluid  DM type II. - insulin gtt while on glucagon  Acute metabolic encephalopathy. Presumed suicide attempt  with intentional overdose. - monitor mental status - will need psychiatry assessment when medically stable  DVT prophylaxis - SQ heparin SUP - not indicated Nutrition - NPO Goals of care - full code  CC time 32 minutes  Chesley Mires, MD [facility] 05/20/2017, 9:01 AM Pager:  [phone] After 3pm call: [phone]

## 2017-05-20 NOTE — Consult Note (Addendum)
[facility] Face-to-Face Psychiatry Consult   Reason for Consult: Suicide risk assessment Referring Physician:  Dr. Halford Chessman Patient Identification: Rachel White Labine MRN:  [id number] Principal Diagnosis: MDD (major depressive disorder), single episode, severe , no psychosis ([place])   Diagnosis:   Patient Active Problem List   Diagnosis Date Noted  . Overdose of calcium-channel Schippers [T46.1X1A] 05/19/2017  . Overdose of cardiovascular agent [T46.901A] 05/19/2017  . Shock circulatory ([place]) [R57.9]   . Symptomatic bradycardia [R00.1]   . Acute blood loss anemia [D62] 11/10/2015  . Severe epistaxis [R04.0] 11/09/2015  . Epistaxis [R04.0] 11/08/2015  . Dehydration [E86.0] 10/26/2015  . Acute renal failure (ARF) ([place]) [N17.9] 10/26/2015  . Hypotension [I95.9] 10/26/2015  . Hypokalemia [E87.6] 10/26/2015  . Diabetes mellitus ([place]) [E11.9] 10/26/2015  . Sleep apnea [G47.30] 10/26/2015  . Diarrhea [R19.7] 10/26/2015  . Fracture of left tibial plateau [S82.142A] 10/19/2014  . Tibial plateau fracture [S82.143A] 10/19/2014  . Peripheral neuropathy ([place]) [G62.9] 08/01/2012  . Insomnia [G47.00] 08/01/2012  . Chronic diarrhea [K52.9] 08/01/2012    Total Time spent with patient: 45 minutes  Subjective:   Rachel White is a 66 y.o. female patient admitted with intentional overdose.   HPI:   According to records, Rachel White was found by her husband unresponsive at home on the couch with several open pill bottles. In the ED, she required insulin and IV fluids in the setting of bradycardia and hypotension. She also required pacing with external pads. On interview, Ms. Caylor reports that she intentionally overdosed on her prescribed medications (Lorazepam and blood pressure medications) yesterday due to SI. She reports not remembering anything after taking them until her husband came home and found her unresponsive on the couch. She reports SI and hopelessness today when asked if she wishes that she  had completed her attempt. She reports grieving the loss of her brother. He died 4 months ago from cirrhotic liver complications. She was his caregiver for the past 6 years as his condition gradually worsened. She was the person to decide to terminate his care at the end of his life. She reports that her mood has been down since he passed away. She reports anhedonia. She previously enjoyed being a homemaker. She reports hypersomnia but denies problems with appetite or concentration.    Past Psychiatric History: Denies   Risk to Self: Yes Risk to Others:  No Prior Inpatient Therapy:  Denies  Prior Outpatient Therapy:  Denies   Past Medical History:  Past Medical History:  Diagnosis Date  . Complication of anesthesia    some difficulty waking up after tubes tied  . Diabetes mellitus without complication ([name])    diet controlled  . Headache    migraines years ago  . Hypertension   . Irritable bowel syndrome    under control  . Neuropathic pain   . Pneumonia    as a child  . Sleep apnea    does not use C_pap    Past Surgical History:  Procedure Laterality Date  . COLONOSCOPY N/A 10/29/2015   Procedure: COLONOSCOPY;  Surgeon: Clarene Essex, MD;  Location: [facility];  Service: Endoscopy;  Laterality: N/A;  . DILATION AND CURETTAGE OF UTERUS    . FOOT SURGERY Left   . GASTRIC BYPASS    . ORIF TIBIA PLATEAU Left 10/19/2014   Procedure: OPEN REDUCTION INTERNAL FIXATION (ORIF) LEFT BICONDYLAR TIBIAL PLATEAU;  Surgeon: Leandrew Koyanagi, MD;  Location: [facility];  Service: Orthopedics;  Laterality: Left;  . TEMPORARY PACEMAKER  N/A 05/19/2017   Procedure: TEMPORARY PACEMAKER;  Surgeon: Leonie Man, MD;  Location: [facility];  Service: Cardiovascular;  Laterality: N/A;  . TUBAL LIGATION     Family History:  Family History  Problem Relation Age of Onset  . Cancer Mother   . Cancer Other   . COPD Other   . Diabetes type II Neg Hx    Family Psychiatric  History: Denies  Social  History:  History  Alcohol Use No     History  Drug Use No    Social History   Social History  . Marital status: Married    Spouse name: N/A  . Number of children: N/A  . Years of education: N/A   Social History Main Topics  . Smoking status: Current Every Day Smoker    Packs/day: 1.00    Years: 41.00    Types: Cigarettes  . Smokeless tobacco: Never Used  . Alcohol use No  . Drug use: No  . Sexual activity: Not Asked   Other Topics Concern  . None   Social History Narrative  . None   Additional Social History: She denies alcohol or illicit substance use. She previously smoked 1 ppd x 15 years. She quit 3 months ago. She lives at home with her husband (married 40 years) and 23 y/o son. She is retired. 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     Allergies:   Allergies  Allergen Reactions  . Sulfa Antibiotics Other (See Comments)    Pt. Does not recall the reaction   . Trazodone Other (See Comments)    Visual Disturbances as well as Tingling sensation  . Zolpidem Tartrate Other (See Comments)    Altered mental status     Labs:  Results for orders placed or performed during the hospital encounter of 05/19/17 (from the past 48 hour(s))  Comprehensive metabolic panel     Status: Abnormal   Collection Time: 05/19/17  5:30 PM  Result Value Ref Range   Sodium 136 135 - 145 mmol/L   Potassium 4.2 3.5 - 5.1 mmol/L    Comment: HEMOLYSIS AT THIS LEVEL MAY AFFECT RESULT   Chloride 107 101 - 111 mmol/L   CO2 18 (L) 22 - 32 mmol/L   Glucose, Bld 188 (H) 65 - 99 mg/dL   BUN 25 (H) 6 - 20 mg/dL   Creatinine, Ser 1.58 (H) 0.44 - 1.00 mg/dL   Calcium 12.6 (H) 8.9 - 10.3 mg/dL   Total Protein 6.3 (L) 6.5 - 8.1 g/dL   Albumin 3.6 3.5 - 5.0 g/dL   AST 33 15 - 41 U/L   ALT 28 14 - 54 U/L   Alkaline Phosphatase 49 38 - 126 U/L   Total Bilirubin 0.4 0.3 - 1.2 mg/dL   GFR calc non Af Amer 33 (L) >60 mL/min   GFR calc Af Amer 38 (L) >60 mL/min     Comment: (NOTE) The eGFR has been calculated using the CKD EPI equation. This calculation has not been validated in all clinical situations. eGFR's persistently <60 mL/min signify possible Chronic Kidney Disease.    Anion gap 11 5 - 15  CBC WITH DIFFERENTIAL     Status: Abnormal   Collection Time: 05/19/17  5:30 PM  Result Value Ref Range   WBC 11.2 (H) 4.0 - 10.5 K/uL   RBC 4.64 3.87 - 5.11 MIL/uL   Hemoglobin 12.8 12.0 - 15.0 g/dL   HCT 39.2 36.0 -  46.0 %   MCV 84.5 78.0 - 100.0 fL   MCH 27.6 26.0 - 34.0 pg   MCHC 32.7 30.0 - 36.0 g/dL   RDW 14.0 11.5 - 15.5 %   Platelets 219 150 - 400 K/uL   Neutrophils Relative % 70 %   Neutro Abs 7.8 (H) 1.7 - 7.7 K/uL   Lymphocytes Relative 29 %   Lymphs Abs 3.2 0.7 - 4.0 K/uL   Monocytes Relative 1 %   Monocytes Absolute 0.1 0.1 - 1.0 K/uL   Eosinophils Relative 0 %   Eosinophils Absolute 0.0 0.0 - 0.7 K/uL   Basophils Relative 0 %   Basophils Absolute 0.0 0.0 - 0.1 K/uL  Magnesium     Status: Abnormal   Collection Time: 05/19/17  5:30 PM  Result Value Ref Range   Magnesium 2.5 (H) 1.7 - 2.4 mg/dL  Troponin I     Status: None   Collection Time: 05/19/17  5:30 PM  Result Value Ref Range   Troponin I <0.03 <0.03 ng/mL  I-Stat Arterial Blood Gas, ED     Status: Abnormal   Collection Time: 05/19/17  5:43 PM  Result Value Ref Range   pH, Arterial 7.328 (L) 7.350 - 7.450   pCO2 arterial 34.1 32.0 - 48.0 mmHg   pO2, Arterial 66.0 (L) 83.0 - 108.0 mmHg   Bicarbonate 18.3 (L) 20.0 - 28.0 mmol/L   TCO2 19 (L) 22 - 32 mmol/L   O2 Saturation 94.0 %   Acid-base deficit 8.0 (H) 0.0 - 2.0 mmol/L   Patient temperature 94.6 F    Collection site RADIAL, ALLEN'S TEST ACCEPTABLE    Drawn by RT    Sample type ARTERIAL   Salicylate level     Status: None   Collection Time: 05/19/17  5:49 PM  Result Value Ref Range   Salicylate Lvl <5.3 2.8 - 30.0 mg/dL  Acetaminophen level     Status: Abnormal   Collection Time: 05/19/17  5:49 PM  Result  Value Ref Range   Acetaminophen (Tylenol), Serum <10 (L) 10 - 30 ug/mL    Comment:        THERAPEUTIC CONCENTRATIONS VARY SIGNIFICANTLY. A RANGE OF 10-30 ug/mL MAY BE AN EFFECTIVE CONCENTRATION FOR MANY PATIENTS. HOWEVER, SOME ARE BEST TREATED AT CONCENTRATIONS OUTSIDE THIS RANGE. ACETAMINOPHEN CONCENTRATIONS >150 ug/mL AT 4 HOURS AFTER INGESTION AND >50 ug/mL AT 12 HOURS AFTER INGESTION ARE OFTEN ASSOCIATED WITH TOXIC REACTIONS.   Ethanol     Status: None   Collection Time: 05/19/17  5:49 PM  Result Value Ref Range   Alcohol, Ethyl (B) <10 <10 mg/dL    Comment:        LOWEST DETECTABLE LIMIT FOR SERUM ALCOHOL IS 10 mg/dL FOR MEDICAL PURPOSES ONLY   I-stat chem 8, ed     Status: Abnormal   Collection Time: 05/19/17  5:54 PM  Result Value Ref Range   Sodium 139 135 - 145 mmol/L   Potassium 4.1 3.5 - 5.1 mmol/L   Chloride 110 101 - 111 mmol/L   BUN 29 (H) 6 - 20 mg/dL   Creatinine, Ser 1.50 (H) 0.44 - 1.00 mg/dL   Glucose, Bld 190 (H) 65 - 99 mg/dL   Calcium, Ion 1.70 (HH) 1.15 - 1.40 mmol/L   TCO2 21 (L) 22 - 32 mmol/L   Hemoglobin 12.9 12.0 - 15.0 g/dL   HCT 38.0 36.0 - 46.0 %   Comment NOTIFIED PHYSICIAN   I-Stat CG4 Lactic Acid, ED  Status: Abnormal   Collection Time: 05/19/17  5:54 PM  Result Value Ref Range   Lactic Acid, Venous 4.00 (HH) 0.5 - 1.9 mmol/L   Comment NOTIFIED PHYSICIAN   CBG monitoring, ED     Status: Abnormal   Collection Time: 05/19/17  6:55 PM  Result Value Ref Range   Glucose-Capillary 210 (H) 65 - 99 mg/dL  Urine rapid drug screen (hosp performed)     Status: Abnormal   Collection Time: 05/19/17  8:20 PM  Result Value Ref Range   Opiates NONE DETECTED NONE DETECTED   Cocaine NONE DETECTED NONE DETECTED   Benzodiazepines POSITIVE (A) NONE DETECTED   Amphetamines NONE DETECTED NONE DETECTED   Tetrahydrocannabinol NONE DETECTED NONE DETECTED   Barbiturates NONE DETECTED NONE DETECTED    Comment:        DRUG SCREEN FOR MEDICAL  PURPOSES ONLY.  IF CONFIRMATION IS NEEDED FOR ANY PURPOSE, NOTIFY LAB WITHIN 5 DAYS.        LOWEST DETECTABLE LIMITS FOR URINE DRUG SCREEN Drug Class       Cutoff (ng/mL) Amphetamine      1000 Barbiturate      200 Benzodiazepine   382 Tricyclics       505 Opiates          300 Cocaine          300 THC              50   Glucose, capillary     Status: Abnormal   Collection Time: 05/19/17  8:43 PM  Result Value Ref Range   Glucose-Capillary 199 (H) 65 - 99 mg/dL  Glucose, capillary     Status: Abnormal   Collection Time: 05/19/17 10:08 PM  Result Value Ref Range   Glucose-Capillary 252 (H) 65 - 99 mg/dL  Glucose, capillary     Status: Abnormal   Collection Time: 05/19/17 10:21 PM  Result Value Ref Range   Glucose-Capillary 252 (H) 65 - 99 mg/dL  CK     Status: None   Collection Time: 05/19/17 10:50 PM  Result Value Ref Range   Total CK 63 38 - 234 U/L  CBC     Status: Abnormal   Collection Time: 05/19/17 10:50 PM  Result Value Ref Range   WBC 11.7 (H) 4.0 - 10.5 K/uL   RBC 4.32 3.87 - 5.11 MIL/uL   Hemoglobin 11.8 (L) 12.0 - 15.0 g/dL   HCT 35.4 (L) 36.0 - 46.0 %   MCV 81.9 78.0 - 100.0 fL   MCH 27.3 26.0 - 34.0 pg   MCHC 33.3 30.0 - 36.0 g/dL   RDW 13.7 11.5 - 15.5 %   Platelets 188 150 - 400 K/uL  Creatinine, serum     Status: Abnormal   Collection Time: 05/19/17 10:50 PM  Result Value Ref Range   Creatinine, Ser 1.31 (H) 0.44 - 1.00 mg/dL   GFR calc non Af Amer 41 (L) >60 mL/min   GFR calc Af Amer 48 (L) >60 mL/min    Comment: (NOTE) The eGFR has been calculated using the CKD EPI equation. This calculation has not been validated in all clinical situations. eGFR's persistently <60 mL/min signify possible Chronic Kidney Disease.   I-STAT 3, arterial blood gas (G3+)     Status: Abnormal   Collection Time: 05/19/17 10:57 PM  Result Value Ref Range   pH, Arterial 7.379 7.350 - 7.450   pCO2 arterial 30.5 (L) 32.0 - 48.0 mmHg   pO2, Arterial  71.0 (L) 83.0 - 108.0  mmHg   Bicarbonate 18.4 (L) 20.0 - 28.0 mmol/L   TCO2 19 (L) 22 - 32 mmol/L   O2 Saturation 95.0 %   Acid-base deficit 6.0 (H) 0.0 - 2.0 mmol/L   Patient temperature 34.9 C    Sample type ARTERIAL   MRSA PCR Screening     Status: None   Collection Time: 05/19/17 10:59 PM  Result Value Ref Range   MRSA by PCR NEGATIVE NEGATIVE    Comment:        The GeneXpert MRSA Assay (FDA approved for NASAL specimens only), is one component of a comprehensive MRSA colonization surveillance program. It is not intended to diagnose MRSA infection nor to guide or monitor treatment for MRSA infections.   I-STAT, chem 8     Status: Abnormal   Collection Time: 05/19/17 11:02 PM  Result Value Ref Range   Sodium 137 135 - 145 mmol/L   Potassium 3.3 (L) 3.5 - 5.1 mmol/L   Chloride 111 101 - 111 mmol/L   BUN 26 (H) 6 - 20 mg/dL   Creatinine, Ser 1.20 (H) 0.44 - 1.00 mg/dL   Glucose, Bld 226 (H) 65 - 99 mg/dL   Calcium, Ion 1.56 (HH) 1.15 - 1.40 mmol/L   TCO2 19 (L) 22 - 32 mmol/L   Hemoglobin 11.2 (L) 12.0 - 15.0 g/dL   HCT 33.0 (L) 36.0 - 46.0 %  Glucose, capillary     Status: Abnormal   Collection Time: 05/20/17 12:42 AM  Result Value Ref Range   Glucose-Capillary 236 (H) 65 - 99 mg/dL  Renal function panel     Status: Abnormal   Collection Time: 05/20/17  1:34 AM  Result Value Ref Range   Sodium 135 135 - 145 mmol/L   Potassium 2.2 (LL) 3.5 - 5.1 mmol/L    Comment: CRITICAL RESULT CALLED TO, READ BACK BY AND VERIFIED WITH: JOHNS,B RN 05/20/2017 0203 JORDANS    Chloride 111 101 - 111 mmol/L   CO2 15 (L) 22 - 32 mmol/L   Glucose, Bld 308 (H) 65 - 99 mg/dL   BUN 30 (H) 6 - 20 mg/dL   Creatinine, Ser 1.61 (H) 0.44 - 1.00 mg/dL   Calcium 9.7 8.9 - 10.3 mg/dL    Comment: DELTA CHECK NOTED   Phosphorus 1.9 (L) 2.5 - 4.6 mg/dL   Albumin 2.8 (L) 3.5 - 5.0 g/dL   GFR calc non Af Amer 32 (L) >60 mL/min   GFR calc Af Amer 37 (L) >60 mL/min    Comment: (NOTE) The eGFR has been calculated using  the CKD EPI equation. This calculation has not been validated in all clinical situations. eGFR's persistently <60 mL/min signify possible Chronic Kidney Disease.    Anion gap 9 5 - 15  Troponin I     Status: Abnormal   Collection Time: 05/20/17  1:35 AM  Result Value Ref Range   Troponin I 0.04 (HH) <0.03 ng/mL    Comment: CRITICAL RESULT CALLED TO, READ BACK BY AND VERIFIED WITH: DUNN,J RN 05/20/2017 0235 JORDANS   Magnesium     Status: None   Collection Time: 05/20/17  1:35 AM  Result Value Ref Range   Magnesium 1.7 1.7 - 2.4 mg/dL  Glucose, capillary     Status: Abnormal   Collection Time: 05/20/17  1:47 AM  Result Value Ref Range   Glucose-Capillary 282 (H) 65 - 99 mg/dL  Glucose, capillary     Status: Abnormal  Collection Time: 05/20/17  2:47 AM  Result Value Ref Range   Glucose-Capillary 331 (H) 65 - 99 mg/dL  Glucose, capillary     Status: Abnormal   Collection Time: 05/20/17  3:51 AM  Result Value Ref Range   Glucose-Capillary 390 (H) 65 - 99 mg/dL  Glucose, capillary     Status: Abnormal   Collection Time: 05/20/17  4:47 AM  Result Value Ref Range   Glucose-Capillary 434 (H) 65 - 99 mg/dL  Glucose, capillary     Status: Abnormal   Collection Time: 05/20/17  5:48 AM  Result Value Ref Range   Glucose-Capillary 518 (HH) 65 - 99 mg/dL  CBC     Status: Abnormal   Collection Time: 05/20/17  5:57 AM  Result Value Ref Range   WBC 24.5 (H) 4.0 - 10.5 K/uL   RBC 4.15 3.87 - 5.11 MIL/uL   Hemoglobin 11.3 (L) 12.0 - 15.0 g/dL   HCT 34.2 (L) 36.0 - 46.0 %   MCV 82.4 78.0 - 100.0 fL   MCH 27.2 26.0 - 34.0 pg   MCHC 33.0 30.0 - 36.0 g/dL   RDW 13.7 11.5 - 15.5 %   Platelets 248 150 - 400 K/uL  Basic metabolic panel     Status: Abnormal   Collection Time: 05/20/17  5:57 AM  Result Value Ref Range   Sodium 132 (L) 135 - 145 mmol/L   Potassium 2.8 (L) 3.5 - 5.1 mmol/L   Chloride 105 101 - 111 mmol/L   CO2 12 (L) 22 - 32 mmol/L   Glucose, Bld 482 (H) 65 - 99 mg/dL   BUN  32 (H) 6 - 20 mg/dL   Creatinine, Ser 1.87 (H) 0.44 - 1.00 mg/dL   Calcium 9.3 8.9 - 10.3 mg/dL   GFR calc non Af Amer 27 (L) >60 mL/min   GFR calc Af Amer 31 (L) >60 mL/min    Comment: (NOTE) The eGFR has been calculated using the CKD EPI equation. This calculation has not been validated in all clinical situations. eGFR's persistently <60 mL/min signify possible Chronic Kidney Disease.    Anion gap 15 5 - 15  Magnesium     Status: Abnormal   Collection Time: 05/20/17  5:57 AM  Result Value Ref Range   Magnesium 1.6 (L) 1.7 - 2.4 mg/dL  Phosphorus     Status: Abnormal   Collection Time: 05/20/17  5:57 AM  Result Value Ref Range   Phosphorus 2.0 (L) 2.5 - 4.6 mg/dL  Lactic acid, plasma     Status: Abnormal   Collection Time: 05/20/17  5:57 AM  Result Value Ref Range   Lactic Acid, Venous 6.6 (HH) 0.5 - 1.9 mmol/L    Comment: CRITICAL RESULT CALLED TO, READ BACK BY AND VERIFIED WITH: J.CORREA,RN 0736 05/20/17 CLARK,S   Glucose, capillary     Status: Abnormal   Collection Time: 05/20/17  6:53 AM  Result Value Ref Range   Glucose-Capillary 452 (H) 65 - 99 mg/dL  I-STAT, chem 8     Status: Abnormal   Collection Time: 05/20/17  8:09 AM  Result Value Ref Range   Sodium 135 135 - 145 mmol/L   Potassium 3.3 (L) 3.5 - 5.1 mmol/L   Chloride 104 101 - 111 mmol/L   BUN 30 (H) 6 - 20 mg/dL   Creatinine, Ser 1.60 (H) 0.44 - 1.00 mg/dL   Glucose, Bld 573 (HH) 65 - 99 mg/dL   Calcium, Ion 1.39 1.15 - 1.40 mmol/L  TCO2 14 (L) 22 - 32 mmol/L   Hemoglobin 11.2 (L) 12.0 - 15.0 g/dL   HCT 33.0 (L) 36.0 - 46.0 %   Comment NOTIFIED PHYSICIAN   Glucose, capillary     Status: Abnormal   Collection Time: 05/20/17  9:14 AM  Result Value Ref Range   Glucose-Capillary 491 (H) 65 - 99 mg/dL  Glucose, capillary     Status: Abnormal   Collection Time: 05/20/17 10:12 AM  Result Value Ref Range   Glucose-Capillary 502 (HH) 65 - 99 mg/dL   Comment 1 Arterial Specimen   Glucose, capillary      Status: Abnormal   Collection Time: 05/20/17 10:58 AM  Result Value Ref Range   Glucose-Capillary 509 (HH) 65 - 99 mg/dL    Current Facility-Administered Medications  Medication Dose Route Frequency Provider Last Rate Last Dose  . 0.9 %  sodium chloride infusion  250 mL Intravenous PRN Desai, Rahul P, PA-C      . acetaminophen (TYLENOL) tablet 650 mg  650 mg Oral Q4H PRN Leonie Man, MD      . chlorhexidine (PERIDEX) 0.12 % solution 15 mL  15 mL Mouth Rinse BID Chesley Mires, MD   15 mL at 05/20/17 1045  . Chlorhexidine Gluconate Cloth 2 % PADS 6 each  6 each Topical Daily Sood, Vineet, MD      . EPINEPHrine (ADRENALIN) 4 mg in dextrose 5 % 250 mL (0.016 mg/mL) infusion  0.5-10 mcg/min Intravenous Titrated Chesley Mires, MD 37.5 mL/hr at 05/20/17 0544 10 mcg/min at 05/20/17 0544  . fentaNYL (SUBLIMAZE) injection 25-50 mcg  25-50 mcg Intravenous Q2H PRN Shearon Stalls, Rahul P, PA-C   50 mcg at 05/20/17 0238  . glucagon (human recombinant) (GLUCAGEN) 25 mg in dextrose 5 % 250 mL (0.1 mg/mL) infusion  5 mg/hr Intravenous Continuous Desai, Rahul P, PA-C 50 mL/hr at 05/20/17 1120 5 mg/hr at 05/20/17 1120  . heparin injection 5,000 Units  5,000 Units Subcutaneous Q8H Leonie Man, MD   5,000 Units at 05/20/17 0505  . [START ON 05/21/2017] Influenza vac split quadrivalent PF (FLUZONE HIGH-DOSE) injection 0.5 mL  0.5 mL Intramuscular Tomorrow-1000 Sood, Vineet, MD      . lactated ringers with KCl 20 mEq/L infusion   Intravenous Continuous Javier Glazier, MD 125 mL/hr at 05/20/17 1031    . MEDLINE mouth rinse  15 mL Mouth Rinse q12n4p Chesley Mires, MD   15 mL at 05/20/17 1046  . norepinephrine (LEVOPHED) 16 mg in dextrose 5 % 250 mL (0.064 mg/mL) infusion  0-40 mcg/min Intravenous Titrated Chesley Mires, MD 37.5 mL/hr at 05/20/17 0444 40 mcg/min at 05/20/17 0444  . ondansetron (ZOFRAN) injection 4 mg  4 mg Intravenous Q6H PRN Leonie Man, MD      . sodium bicarbonate 150 mEq in sterile water 1,000  mL infusion   Intravenous Continuous Javier Glazier, MD 75 mL/hr at 05/20/17 0349    . sodium chloride 0.9 % 250 mL with insulin regular (NOVOLIN R,HUMULIN R) 250 Units infusion   Intravenous Continuous Javier Glazier, MD 100 mL/hr at 05/20/17 1025    . sodium chloride flush (NS) 0.9 % injection 10-40 mL  10-40 mL Intracatheter Q12H Sood, Vineet, MD      . sodium chloride flush (NS) 0.9 % injection 10-40 mL  10-40 mL Intracatheter PRN Chesley Mires, MD        Musculoskeletal: Strength & Muscle Tone: within normal limits Gait & Station: UTA as patient was  lying in bed and connected to multiple lines.  Patient leans: N/A  Psychiatric Specialty Exam: Physical Exam  Nursing note and vitals reviewed. Constitutional: She is oriented to person, place, and time. She appears well-developed and well-nourished.  HENT:  Head: Normocephalic and atraumatic.  Neck: Normal range of motion.  Respiratory: Effort normal.  Musculoskeletal: Normal range of motion.  Neurological: She is alert and oriented to person, place, and time.  Skin: No rash noted.    Review of Systems  Musculoskeletal: Positive for back pain (chronic back pain since work injury 15 years ago.).  Psychiatric/Behavioral: Positive for depression and suicidal ideas. Negative for hallucinations, memory loss and substance abuse. The patient is not nervous/anxious and does not have insomnia.     Blood pressure (!) 98/46, pulse 79, temperature 100 F (37.8 C), resp. rate 17, height 5' 6"  (1.676 m), weight 77.1 kg (170 lb), SpO2 96 %.Body mass index is 27.44 kg/m.  General Appearance: Well Groomed, elderly Caucasian female with short gray hair and multiple IV lines attached.  Eye Contact:  Good  Speech:  Normal Rate  Volume:  Normal  Mood:  Depressed  Affect:  Depressed and Tearful  Thought Process:  Goal Directed and Linear  Orientation:  Full (Time, Place, and Person)  Thought Content:  Logical  Suicidal Thoughts:  Yes.  with  intent/plan  Homicidal Thoughts:  No  Memory:  Immediate;   Good Recent;   Good Remote;   Good  Judgement:  Poor given recent overdose.  Insight:  Poor. She does not realize the seriousness of her attempt and refuses treatment.   Psychomotor Activity:  Decreased  Concentration:  Concentration: Good and Attention Span: Good  Recall:  Good  Fund of Knowledge:  Good  Language:  Good  Akathisia:  No  Handed:  Right  AIMS (if indicated):     Assets:  Financial Resources/Insurance Housing Social Support  ADL's:  Intact  Cognition:  WNL  Sleep:   Okay    Assessment: Ms. Quraishi is admitted to the hospital following intentional suicide attempt by overdose of her prescribed medications (Lorazepam and antihypertensives). She continues to endorse hopelessness and SI with depressive symptoms. She warrants inpatient psychiatric admission for stabilization and treatment given high risk of harming self.   Treatment Plan Summary: MDD, single episode, severe, without psychosis: -Recommend inpatient psychiatric hospitalization following medical clearance. -Please pursue involuntary commitment if patient refuses voluntary psychiatric hospitalization. -Please have unit SW find appropriate psychiatric bed once patient is medically cleared.  -Continue sitter given risk of harm to self with ongoing suicidal ideation. -Patient declines medication management for depression at this time. -Will sign off on patient at this time. Please consult psychiatry as needed.   Disposition: Recommend psychiatric Inpatient admission when medically cleared.  Faythe Dingwall, DO 05/20/2017 12:21 PM

## 2017-05-20 NOTE — Progress Notes (Signed)
Son in speaking to patient regarding events. Offering her help and solutions. he encouraged patient to talk about problems and offered any assistance..  Poison control called  updated on treatments and condition

## 2017-05-20 NOTE — Progress Notes (Signed)
CRITICAL VALUE ALERT  Critical Value:  K+ 2.2  Date & Time Notied:  05/20/2017 0205  Provider Notified: Georgina Peer, RN notified by Erlene Quan, RN  Orders Received/Actions taken: RN Georgina Peer will f/u

## 2017-05-20 NOTE — Progress Notes (Signed)
Cardiology Dr. Jenkins Rouge called in to check on patient. Has only reverted to pacing a couple of times when in deep sleep.  Ordered d/c of temp pacer, may leave in venous sheath for access.

## 2017-05-20 NOTE — Progress Notes (Signed)
Family in visiting, family stated that she said that she would" try this again when she gets home". Patient drowsy, but responding appropriately to questions.

## 2017-05-20 NOTE — Plan of Care (Signed)
Problem: Spiritual Needs Goal: Ability to function at adequate level Outcome: Not Progressing Continues suicidal ideation  Problem: Pain Managment: Goal: General experience of comfort will improve Outcome: Not Progressing C/o pain in back and hands, medicated with tylenol. Patient drowsy eyes closed frequently  Problem: Physical Regulation: Goal: Ability to maintain clinical measurements within normal limits will improve Outcome: Not Progressing On multiple medications to increase BP. Goal: Will remain free from infection Outcome: Not Progressing Temp 100.4, WBC elevated  Problem: Skin Integrity: Goal: Risk for impaired skin integrity will decrease Outcome: Progressing Moving in bed  Problem: Fluid Volume: Goal: Ability to maintain a balanced intake and output will improve Outcome: Progressing Intake with IV's NPO currently  Problem: Nutrition: Goal: Adequate nutrition will be maintained Outcome: Not Progressing NPO for now

## 2017-05-20 NOTE — Progress Notes (Signed)
Temporary pacemaker wires removed, patient's heart rate and vital signs stable during and post removal.  Right femoral sheath remains in place, dressing changed at site

## 2017-05-20 NOTE — Progress Notes (Signed)
  Echocardiogram 2D Echocardiogram has been performed.  Rachel White 05/20/2017, 6:29 PM

## 2017-05-20 NOTE — Progress Notes (Signed)
C/o husband coming home to early  [place] he was not suppose to be there. Was mad that he found her.  She said that she is very depressed bc her brother died just recently. Also that she gained weight back d/t stop smoking.  She has only been talking about depressing things when awake.  Spoke about the 3 kids that recently died in at a bus stop.  Wants me to stop everything that is connected to her bc she does not want this.  Reminded me that she is a DNR and has living will that shows it.   She did tell me that she tried to make a Dr's appointment with her PCP but was not able to get in to see him till next week.

## 2017-05-21 DIAGNOSIS — F322 Major depressive disorder, single episode, severe without psychotic features: Secondary | ICD-10-CM

## 2017-05-21 DIAGNOSIS — T50902A Poisoning by unspecified drugs, medicaments and biological substances, intentional self-harm, initial encounter: Secondary | ICD-10-CM

## 2017-05-21 LAB — BASIC METABOLIC PANEL
ANION GAP: 6 (ref 5–15)
Anion gap: 6 (ref 5–15)
BUN: 23 mg/dL — ABNORMAL HIGH (ref 6–20)
BUN: 21 mg/dL — ABNORMAL HIGH (ref 6–20)
CHLORIDE: 108 mmol/L (ref 101–111)
CHLORIDE: 107 mmol/L (ref 101–111)
CO2: 21 mmol/L — ABNORMAL LOW (ref 22–32)
CO2: 22 mmol/L (ref 22–32)
CREATININE: 1.44 mg/dL — AB (ref 0.44–1.00)
CREATININE: 1.27 mg/dL — AB (ref 0.44–1.00)
Calcium: 8.2 mg/dL — ABNORMAL LOW (ref 8.9–10.3)
Calcium: 8.1 mg/dL — ABNORMAL LOW (ref 8.9–10.3)
GFR calc non Af Amer: 37 mL/min — ABNORMAL LOW (ref >60)
GFR calc non Af Amer: 43 mL/min — ABNORMAL LOW (ref >60)
GFR, EST AFRICAN AMERICAN: 42 mL/min — AB (ref >60)
GFR, EST AFRICAN AMERICAN: 49 mL/min — AB (ref >60)
Glucose, Bld: 300 mg/dL — ABNORMAL HIGH (ref 65–99)
Glucose, Bld: 228 mg/dL — ABNORMAL HIGH (ref 65–99)
POTASSIUM: 3.7 mmol/L (ref 3.5–5.1)
POTASSIUM: 3.6 mmol/L (ref 3.5–5.1)
SODIUM: 135 mmol/L (ref 135–145)
SODIUM: 135 mmol/L (ref 135–145)

## 2017-05-21 LAB — GLUCOSE, CAPILLARY
GLUCOSE-CAPILLARY: 100 mg/dL — AB (ref 65–99)
GLUCOSE-CAPILLARY: 112 mg/dL — AB (ref 65–99)
GLUCOSE-CAPILLARY: 117 mg/dL — AB (ref 65–99)
GLUCOSE-CAPILLARY: 125 mg/dL — AB (ref 65–99)
GLUCOSE-CAPILLARY: 130 mg/dL — AB (ref 65–99)
GLUCOSE-CAPILLARY: 136 mg/dL — AB (ref 65–99)
GLUCOSE-CAPILLARY: 152 mg/dL — AB (ref 65–99)
GLUCOSE-CAPILLARY: 181 mg/dL — AB (ref 65–99)
GLUCOSE-CAPILLARY: 280 mg/dL — AB (ref 65–99)
Glucose-Capillary: 439 mg/dL — ABNORMAL HIGH (ref 65–99)
Glucose-Capillary: 342 mg/dL — ABNORMAL HIGH (ref 65–99)
Glucose-Capillary: 250 mg/dL — ABNORMAL HIGH (ref 65–99)
Glucose-Capillary: 217 mg/dL — ABNORMAL HIGH (ref 65–99)
Glucose-Capillary: 167 mg/dL — ABNORMAL HIGH (ref 65–99)
Glucose-Capillary: 167 mg/dL — ABNORMAL HIGH (ref 65–99)
Glucose-Capillary: 162 mg/dL — ABNORMAL HIGH (ref 65–99)
Glucose-Capillary: 96 mg/dL (ref 65–99)
Glucose-Capillary: 80 mg/dL (ref 65–99)
Glucose-Capillary: 74 mg/dL (ref 65–99)
Glucose-Capillary: 84 mg/dL (ref 65–99)
Glucose-Capillary: 80 mg/dL (ref 65–99)

## 2017-05-21 LAB — HEPATIC FUNCTION PANEL
ALBUMIN: 2.5 g/dL — AB (ref 3.5–5.0)
ALK PHOS: 40 U/L (ref 38–126)
ALT: 31 U/L (ref 14–54)
AST: 32 U/L (ref 15–41)
Bilirubin, Direct: <0.1 mg/dL — ABNORMAL LOW (ref 0.1–0.5)
TOTAL PROTEIN: 4.5 g/dL — AB (ref 6.5–8.1)
Total Bilirubin: 0.8 mg/dL (ref 0.3–1.2)

## 2017-05-21 LAB — CBC
HCT: 32.8 % — ABNORMAL LOW (ref 36.0–46.0)
Hemoglobin: 10.9 g/dL — ABNORMAL LOW (ref 12.0–15.0)
MCH: 26.9 pg (ref 26.0–34.0)
MCHC: 33.2 g/dL (ref 30.0–36.0)
MCV: 81 fL (ref 78.0–100.0)
PLATELETS: 163 10*3/uL (ref 150–400)
RBC: 4.05 MIL/uL (ref 3.87–5.11)
RDW: 14.2 % (ref 11.5–15.5)
WBC: 22 10*3/uL — AB (ref 4.0–10.5)

## 2017-05-21 LAB — MAGNESIUM: MAGNESIUM: 1.4 mg/dL — AB (ref 1.7–2.4)

## 2017-05-21 LAB — ECHOCARDIOGRAM COMPLETE
Height: 66 in
Weight: 2720 oz

## 2017-05-21 LAB — PHOSPHORUS: Phosphorus: 2.7 mg/dL (ref 2.5–4.6)

## 2017-05-21 MED ORDER — SODIUM CHLORIDE 0.9% FLUSH
10.0000 mL | Freq: Two times a day (BID) | INTRAVENOUS | Status: DC
  Administered 2017-05-22: 10 mL
  Administered 2017-05-22: 20 mL
  Administered 2017-05-23: 10 mL
  Administered 2017-05-23 – 2017-05-24 (×2): 20 mL

## 2017-05-21 MED ORDER — SODIUM CHLORIDE 0.9 % IV SOLN
INTRAVENOUS | Status: DC
  Administered 2017-05-21 – 2017-05-22 (×2): via INTRAVENOUS

## 2017-05-21 MED ORDER — INSULIN ASPART 100 UNIT/ML ~~LOC~~ SOLN
0.0000 [IU] | SUBCUTANEOUS | Status: DC
  Administered 2017-05-22 (×5): 1 [IU] via SUBCUTANEOUS

## 2017-05-21 MED ORDER — SENNA 8.6 MG PO TABS: 1.0000 | ORAL_TABLET | Freq: Every day | ORAL | Status: DC | PRN

## 2017-05-21 MED ORDER — MAGNESIUM SULFATE 50 % IJ SOLN
3.0000 g | Freq: Once | INTRAVENOUS | Status: AC
  Administered 2017-05-22: 3 g via INTRAVENOUS
  Filled 2017-05-21: qty 6

## 2017-05-21 MED ORDER — SODIUM CHLORIDE 0.9 % IV SOLN
INTRAVENOUS | Status: DC
  Administered 2017-05-21: 0.4 [IU]/h via INTRAVENOUS
  Filled 2017-05-21: qty 1

## 2017-05-21 MED ORDER — INSULIN REGULAR BOLUS VIA INFUSION
0.0000 [IU] | Freq: Three times a day (TID) | INTRAVENOUS | Status: DC
  Filled 2017-05-21: qty 10

## 2017-05-21 MED ORDER — DEXTROSE 50 % IV SOLN: 25.0000 mL | INTRAVENOUS | Status: DC | PRN

## 2017-05-21 MED ORDER — SODIUM CHLORIDE 0.9% FLUSH
10.0000 mL | INTRAVENOUS | Status: DC | PRN
  Administered 2017-05-24: 30 mL
  Filled 2017-05-21: qty 40

## 2017-05-21 MED ORDER — GABAPENTIN 300 MG PO CAPS
300.0000 mg | ORAL_CAPSULE | Freq: Three times a day (TID) | ORAL | Status: DC
  Administered 2017-05-22 – 2017-05-24 (×9): 300 mg via ORAL
  Filled 2017-05-21 (×9): qty 1

## 2017-05-21 MED ORDER — NOREPINEPHRINE BITARTRATE 1 MG/ML IV SOLN
0.0000 ug/min | INTRAVENOUS | Status: DC
  Administered 2017-05-21: 35 ug/min via INTRAVENOUS
  Administered 2017-05-21: 40 ug/min via INTRAVENOUS
  Filled 2017-05-21: qty 16

## 2017-05-21 MED ORDER — DEXTROSE-NACL 5-0.45 % IV SOLN
INTRAVENOUS | Status: DC
  Administered 2017-05-21 – 2017-05-22 (×2): via INTRAVENOUS

## 2017-05-21 NOTE — Progress Notes (Signed)
[facility] Progress Note Patient Name: Rachel White DOB: July 26, 1950 MRN: [id number]   Date of Service  05/21/2017  HPI/Events of Note  Notified by bedside nurse that patient has been off insulin drip for hours. Glucose in the 80s. Currently weaned off epinephrine drip & weaning Levophed drip. Rounding physician note reviewed. Telemetry shows patient in sinus rhythm with mild tachycardia. Patient remains on dextrose infusion as well as glucagon infusion. Also receiving dextrose IV fluid through Levophed infusion.   eICU Interventions  1. Continuing to wean Levophed infusion 2. Continuing glucagon and dextrose infusions given marginal glucose level 3. Switching Accu-Cheks to every 4 hours 4. Discontinuing insulin drip &serial insulin per sensitive algorithm 5. M.D. notification parameters placed for glucose level      Intervention Category Major Interventions: Shock - evaluation and management;Other:;Hyperglycemia - active titration of insulin therapy  [name] 05/21/2017, 7:29 PM

## 2017-05-21 NOTE — Progress Notes (Signed)
[facility] Progress Note Patient Name: Rachel White Verry DOB: 1951-03-15 MRN: [id number]   Date of Service  05/21/2017  HPI/Events of Note  Nurse reports patient complaining of diarrhea with history of IBS. Also complaining of restless leg syndrome on home Neurontin. Successfully weaned off Levophed. Patient with hypomagnesemia yesterday morning which seemingly was not addressed after review of medications administered from yesterday   eICU Interventions  1. Magnesium sulfate 3 g IV now 2. Restarting Neurontin 300 mg by mouth 3 times a day (home dose 800 mg by mouth 3 times a day) 3. Rounding physician to address diarrhea      Intervention Category Intermediate Interventions: Other:  [name] 05/21/2017, 11:59 PM

## 2017-05-21 NOTE — Progress Notes (Signed)
    Subjective:  Patient endorses pain in her shoulder and hand numbness. She wonders if she can eat or drink.  Objective:  Vital signs in last 24 hours: Vitals:   05/21/17 0615 05/21/17 0630 05/21/17 0645 05/21/17 0700  BP: (!) 93/41   (!) 105/49  Pulse: 92 87 88 94  Resp: 17 17 15  (!) 22  Temp: 99.3 F (37.4 C) 99.3 F (37.4 C) 99.3 F (37.4 C) 99.3 F (37.4 C)  TempSrc:      SpO2:    94%  Weight:      Height:       Constitutional: Appears uncomfortable CV: RRR, diffuse 2/6 systolic flow murmur throughout precordium, DP pulses intact Resp: CTAB, no rales or wheezes Abd: Soft, ND Ext: warm, dry, no LE edema  Telemetry - personally reviewed - 1st degree AV block  Echo 05/20/17 - EF 65-70%, mild MR  Assessment/Plan:  Principal Problem:   MDD (major depressive disorder), single episode, severe , no psychosis ([name]) Active Problems:   Overdose of calcium-channel [place]   Overdose of cardiovascular agent   Shock circulatory ([name])   Symptomatic bradycardia  66 y.o. female patient without cardiac history presented with profound bradycardia and hypotension after intentional Norvasc overdose.  Bradycardia/shock 2/2 CCB overdose - Still on epinephrine and levophed. Pacer wires have been removed with patient maintaining HR in 80s-90s, though she continues to have a 1st degree AV block. Patient is going into inpatient psych after medical clearance. --pressors per primary --pharmacy to look into expected effect of the amount of amlodipine patient ingested  Murmur - Echo only shows mild MR and murmur is present throughout precordium which is consistent with flow murmur.   Alphonzo Grieve, MD 05/21/2017, 8:00 AM   History and all data above reviewed.  Patient examined.  I agree with the findings as above.  She remains hypotensive although her HR has rebounded and we were able to take out the temporary pacer.  She is complaining of bilateral shoulder pain with bursitis.   The  patient exam reveals COR:RRR, systolic murmur brief  ,  Lungs: Clear  ,  Abd: Positive bowel sounds, no rebound no guarding, Ext Mild edema   .  All available labs, radiology testing, previous records reviewed. Agree with documented assessment and plan. Supportive care with vasopressors.  We are looking into the role of further calcium infusion.  She is off the insulin.  Echo EF OK.    Minus Breeding  8:41 AM  05/21/2017

## 2017-05-21 NOTE — Progress Notes (Signed)
PULMONARY / CRITICAL CARE MEDICINE   Name: Rachel White MRN: [id number] DOB: [birth year]    ADMISSION DATE:  05/19/2017 CONSULTATION DATE:  05/19/17  REFERRING MD:  Kathrynn Humble  CHIEF COMPLAINT:  AMS after calcium channel overdose  HISTORY OF PRESENT ILLNESS:   66 yo female smoker found unresponsive at home by family, and found to have empty pill bottles including amlodipine.  She was found to have heart rate of 20.  SUBJECTIVE:  Pacer removed 05/20/17. Intrinsic rate of 92.  Awake and alert, and complaining of bed rest, states it is " Making me crazy'. Wants to get OOB VITAL SIGNS: BP (!) 105/49   Pulse 94   Temp 99.3 F (37.4 C)   Resp (!) 22   Ht 5\' 6"  (1.676 m)   Wt 179 lb 3.7 oz (81.3 kg)   SpO2 94%   BMI 28.93 kg/m   INTAKE / OUTPUT: I/O last 3 completed shifts: In: 13086 [I.V.:14567; IV Piggyback:800] Out: 5784 [Urine:3860]   PHYSICAL EXAMINATION:  General - alert, awake and following commands. Wants to get OOB. Eyes - PERRLA ENT - no sinus tenderness, no oral exudate, no LAN Cardiac - Intrinsic rhythm, S1, S2, RRR, No RMG Chest - no wheeze, rales, No distress Abd - soft, non tender, non-distended, BS quiet Ext - no edema, warm and dry Skin - no rashes, lesions ,m warm dry and intact Neuro - normal strength   LABS:  BMET  Recent Labs Lab 05/20/17 2009 05/21/17 0000 05/21/17 0509  NA 135 135 135  K 3.7 3.7 3.6  CL 108 108 107  CO2 21* 21* 22  BUN 25* 23* 21*  CREATININE 1.54* 1.44* 1.27*  GLUCOSE 379* 300* 228*    Electrolytes  Recent Labs Lab 05/20/17 0557  05/20/17 1556 05/20/17 2009 05/21/17 0000 05/21/17 0509  CALCIUM 9.3  < > 8.7* 8.5* 8.2* 8.1*  MG 1.6*  --  1.6*  --   --  1.4*  PHOS 2.0*  --  2.4*  --   --  2.7  < > = values in this interval not displayed.  CBC  Recent Labs Lab 05/19/17 2250  05/20/17 0557 05/20/17 0809 05/21/17 0509  WBC 11.7*  --  24.5*  --  22.0*  HGB 11.8*  < > 11.3* 11.2* 10.9*  HCT 35.4*  < >  34.2* 33.0* 32.8*  PLT 188  --  248  --  163  < > = values in this interval not displayed.  Coag's No results for input(s): APTT, INR in the last 168 hours.  Sepsis Markers  Recent Labs Lab 05/19/17 1754 05/20/17 0557  LATICACIDVEN 4.00* 6.6*    ABG  Recent Labs Lab 05/19/17 1743 05/19/17 2257  PHART 7.328* 7.379  PCO2ART 34.1 30.5*  PO2ART 66.0* 71.0*    Liver Enzymes  Recent Labs Lab 05/19/17 1730 05/20/17 0134  AST 33  --   ALT 28  --   ALKPHOS 49  --   BILITOT 0.4  --   ALBUMIN 3.6 2.8*    Cardiac Enzymes  Recent Labs Lab 05/19/17 1730 05/20/17 0135  TROPONINI <0.03 0.04*    Glucose  Recent Labs Lab 05/21/17 0016 05/21/17 0203 05/21/17 0303 05/21/17 0559 05/21/17 0633 05/21/17 0658  GLUCAP 280* 250* 217* 152* 181* 167*    Imaging No results found.   STUDIES:  05/20/2017>> The cavity size was normal. Wall thickness was   normal. Systolic function was vigorous. EF:  65% to 70%. MV: Mild regurgitation  SIGNIFICANT EVENTS: 10/31 admit, temporary pacer inserted 11/2>> continues to require maximal pressor support>> drug half life is 50-60 hours  LINES/TUBES: Rt femoral sheath 10/31 >>  05/21/2017>> External pacer removed  DISCUSSION: 66 y.o. female admitted 10/31 after intentional  overdose of calcium channel blockers, after recent death of a family member.  ASSESSMENT / PLAN:  Overdose of calcium channel [place] with resulting cardiogenic shock. Medication half life 50-60 hours Currently maxed out on levo and epi - pressors to keep MAP > 60 - pacer per cardiology - continue glucagon with insulin gtt - Echo results as above - Will check LFT's - PICC placement >> remove sheath - Consider reglan to increase GI motility facilitate drug clearance  - Consider calcium supplementation>> per cards  Anion gap metabolic acidosis with lactic acidosis. Acute renal failure with ATN. Hypokalemia, hypophosphatemia, hypomagnesemia. - trend   BMET - replace electrolytes as needed - continue HCO3 in IV fluid  DM type II. - insulin gtt while on glucagon - Goal 180 or above  Acute metabolic encephalopathy.>> improving Presumed suicide attempt with intentional overdose. - monitor mental status - will need psychiatry assessment when medically stable  Pulmonary Remains on [place] with sats of 94% CXR prn Mobilize as able once sheath removed and BP allows IS when able   GI - Clear liquids and advance as tolerated  DVT prophylaxis - SQ heparin SUP - not indicated Nutrition - NPO Goals of care - full code    Magdalen Spatz, AGACNP-BC [place] 05/21/2017, 8:06 AM Pager:  [phone]

## 2017-05-21 NOTE — Care Management Note (Addendum)
Case Management Note  Patient Details  Name: Rachel White MRN: [id number] Date of Birth: 04/08/1951  Subjective/Objective:   AMS after calcium channel overdose, found unresponsive by family. She is from home with her spouse and her son, her spouse is semi-retired ,so he will be home with her at discharge.  She has a PCP and medication coverage.  She is scheduled to be seen by psych.                      Action/Plan: NCM will follow for dc needs.   Expected Discharge Date:                  Expected Discharge Plan:     In-House Referral:     Discharge planning Services  CM Consult  Post Acute Care Choice:    Choice offered to:     DME Arranged:    DME Agency:     HH Arranged:    HH Agency:     Status of Service:  In process, will continue to follow  If discussed at Long Length of Stay Meetings, dates discussed:    Additional Comments:  Zenon Mayo, RN 05/21/2017, 5:14 PM

## 2017-05-21 NOTE — Progress Notes (Signed)
Peripherally Inserted Central Catheter/Midline Placement  The IV Nurse has discussed with the patient and/or persons authorized to consent for the patient, the purpose of this procedure and the potential benefits and risks involved with this procedure.  The benefits include less needle sticks, lab draws from the catheter, and the patient may be discharged home with the catheter. Risks include, but not limited to, infection, bleeding, blood clot (thrombus formation), and puncture of an artery; nerve damage and irregular heartbeat and possibility to perform a PICC exchange if needed/ordered by physician.  Alternatives to this procedure were also discussed.  Bard Power PICC patient education guide, fact sheet on infection prevention and patient information card has been provided to patient /or left at bedside.    PICC/Midline Placement Documentation        Darlyn Read 05/21/2017, 12:01 PM

## 2017-05-22 ENCOUNTER — Inpatient Hospital Stay (HOSPITAL_COMMUNITY): Payer: Medicare Other

## 2017-05-22 LAB — COMPREHENSIVE METABOLIC PANEL
ALK PHOS: 52 U/L (ref 38–126)
ALT: 38 U/L (ref 14–54)
AST: 48 U/L — ABNORMAL HIGH (ref 15–41)
Albumin: 2.6 g/dL — ABNORMAL LOW (ref 3.5–5.0)
Anion gap: 4 — ABNORMAL LOW (ref 5–15)
BILIRUBIN TOTAL: 0.5 mg/dL (ref 0.3–1.2)
BUN: 10 mg/dL (ref 6–20)
CALCIUM: 8.2 mg/dL — AB (ref 8.9–10.3)
CHLORIDE: 109 mmol/L (ref 101–111)
CO2: 20 mmol/L — ABNORMAL LOW (ref 22–32)
CREATININE: 0.73 mg/dL (ref 0.44–1.00)
GLUCOSE: 134 mg/dL — AB (ref 65–99)
Potassium: 4.7 mmol/L (ref 3.5–5.1)
Sodium: 133 mmol/L — ABNORMAL LOW (ref 135–145)
Total Protein: 4.9 g/dL — ABNORMAL LOW (ref 6.5–8.1)

## 2017-05-22 LAB — GLUCOSE, CAPILLARY
GLUCOSE-CAPILLARY: 123 mg/dL — AB (ref 65–99)
GLUCOSE-CAPILLARY: 135 mg/dL — AB (ref 65–99)
GLUCOSE-CAPILLARY: 138 mg/dL — AB (ref 65–99)
GLUCOSE-CAPILLARY: 136 mg/dL — AB (ref 65–99)
GLUCOSE-CAPILLARY: 128 mg/dL — AB (ref 65–99)
Glucose-Capillary: 124 mg/dL — ABNORMAL HIGH (ref 65–99)
Glucose-Capillary: 116 mg/dL — ABNORMAL HIGH (ref 65–99)

## 2017-05-22 LAB — MAGNESIUM: Magnesium: 2 mg/dL (ref 1.7–2.4)

## 2017-05-22 LAB — CBC
HEMATOCRIT: 34.7 % — AB (ref 36.0–46.0)
Hemoglobin: 11.3 g/dL — ABNORMAL LOW (ref 12.0–15.0)
MCH: 26.7 pg (ref 26.0–34.0)
MCHC: 32.6 g/dL (ref 30.0–36.0)
MCV: 82 fL (ref 78.0–100.0)
PLATELETS: 123 10*3/uL — AB (ref 150–400)
RBC: 4.23 MIL/uL (ref 3.87–5.11)
RDW: 15.3 % (ref 11.5–15.5)
WBC: 13.7 10*3/uL — ABNORMAL HIGH (ref 4.0–10.5)

## 2017-05-22 LAB — PHOSPHORUS: Phosphorus: 2.1 mg/dL — ABNORMAL LOW (ref 2.5–4.6)

## 2017-05-22 MED ORDER — DEXTROSE 10 % IV SOLN
INTRAVENOUS | Status: DC
  Administered 2017-05-22 – 2017-05-23 (×3): via INTRAVENOUS

## 2017-05-22 MED ORDER — DEXTROSE-NACL 5-0.9 % IV SOLN
INTRAVENOUS | Status: DC
  Administered 2017-05-22: 09:00:00 via INTRAVENOUS

## 2017-05-22 MED ORDER — FUROSEMIDE 10 MG/ML IJ SOLN
20.0000 mg | Freq: Four times a day (QID) | INTRAMUSCULAR | Status: AC
  Administered 2017-05-22 – 2017-05-23 (×3): 20 mg via INTRAVENOUS
  Filled 2017-05-22 (×3): qty 2

## 2017-05-22 MED ORDER — SODIUM PHOSPHATES 45 MMOLE/15ML IV SOLN
20.0000 mmol | Freq: Once | INTRAVENOUS | Status: AC
  Administered 2017-05-22: 20 mmol via INTRAVENOUS
  Filled 2017-05-22: qty 6.67

## 2017-05-22 MED ORDER — GLUCAGON HCL RDNA (DIAGNOSTIC) 1 MG IJ SOLR
1.0000 mg/h | INTRAVENOUS | Status: DC
  Administered 2017-05-22: 1 mg/h via INTRAVENOUS
  Filled 2017-05-22: qty 25

## 2017-05-22 NOTE — Progress Notes (Signed)
PULMONARY / CRITICAL CARE MEDICINE   Name: Rachel White MRN: [id number] DOB: 04-10-1951    ADMISSION DATE:  05/19/2017 CONSULTATION DATE:  05/19/17  REFERRING MD:  Kathrynn Humble  CHIEF COMPLAINT:  AMS after calcium channel overdose  HISTORY OF PRESENT ILLNESS:   66 yo female smoker found unresponsive at home by family, and found to have empty pill bottles including amlodipine.  She was found to have heart rate of 20.  SUBJECTIVE:Complaining of abdominal pain, awake and alert, but confused.    VITAL SIGNS: BP 115/65 (BP Location: Left Arm)   Pulse 82   Temp 97.9 F (36.6 C) (Core (Comment))   Resp 17   Ht 5\' 6"  (1.676 m)   Wt 190 lb 14.7 oz (86.6 kg)   SpO2 91%   BMI 30.81 kg/m   INTAKE / OUTPUT: I/O last 3 completed shifts: In: 14129.9 [I.V.:13673.9; IV Piggyback:456] Out: 21194 [RDEYC:14481]   PHYSICAL EXAMINATION:  General - alert, awake and following commands. Complaining of abdominal pain ? GB Eyes - PERRLA ENT - no sinus tenderness, no oral exudate, no LAN Cardiac - Intrinsic rhythm, S1, S2, RRR, No RMG Chest - no wheeze, fine rales, No distress Abd - soft,  Tender RUQ and LUQ, , non-distended, BS quiet Ext - no edema, warm and dry Skin - no rashes, lesions ,m warm dry and intact Neuro - normal strength, flat affect, MAE x 4   LABS:  BMET  Recent Labs Lab 05/21/17 0000 05/21/17 0509 05/22/17 0511  NA 135 135 133*  K 3.7 3.6 4.7  CL 108 107 109  CO2 21* 22 20*  BUN 23* 21* 10  CREATININE 1.44* 1.27* 0.73  GLUCOSE 300* 228* 134*    Electrolytes  Recent Labs Lab 05/20/17 1556  05/21/17 0000 05/21/17 0509 05/22/17 0511  CALCIUM 8.7*  < > 8.2* 8.1* 8.2*  MG 1.6*  --   --  1.4* 2.0  PHOS 2.4*  --   --  2.7 2.1*  < > = values in this interval not displayed.  CBC  Recent Labs Lab 05/20/17 0557 05/20/17 0809 05/21/17 0509 05/22/17 0511  WBC 24.5*  --  22.0* 13.7*  HGB 11.3* 11.2* 10.9* 11.3*  HCT 34.2* 33.0* 32.8* 34.7*  PLT 248   --  163 123*    Coag's No results for input(s): APTT, INR in the last 168 hours.  Sepsis Markers  Recent Labs Lab 05/19/17 1754 05/20/17 0557  LATICACIDVEN 4.00* 6.6*    ABG  Recent Labs Lab 05/19/17 1743 05/19/17 2257  PHART 7.328* 7.379  PCO2ART 34.1 30.5*  PO2ART 66.0* 71.0*    Liver Enzymes  Recent Labs Lab 05/19/17 1730 05/20/17 0134 05/21/17 0816 05/22/17 0511  AST 33  --  32 48*  ALT 28  --  31 38  ALKPHOS 49  --  40 52  BILITOT 0.4  --  0.8 0.5  ALBUMIN 3.6 2.8* 2.5* 2.6*    Cardiac Enzymes  Recent Labs Lab 05/19/17 1730 05/20/17 0135  TROPONINI <0.03 0.04*    Glucose  Recent Labs Lab 05/21/17 1713 05/21/17 1808 05/21/17 1909 05/21/17 2017 05/21/17 2346 05/22/17 0357  GLUCAP 80 74 84 80 130* 124*    Imaging No results found.   STUDIES:  05/20/2017>> The cavity size was normal. Wall thickness was   normal. Systolic function was vigorous. EF:  65% to 70%. MV: Mild regurgitation  SIGNIFICANT EVENTS: 10/31 admit, temporary pacer inserted 11/2>> continues to require maximal pressor support>> drug half  life is 50-60 hours  LINES/TUBES: Rt femoral sheath 10/31 >>  05/21/2017>> External pacer removed 11/2>> PICC  DISCUSSION: 66 y.o. female admitted 10/31 after intentional  overdose of calcium channel blockers, after recent death of a family member.  ASSESSMENT / PLAN:  Overdose of calcium channel Rachel White with resulting cardiogenic shock. Medication half life 50-60 hours Levo/ Epi off as of 11/3 Insulin gtt off as of 11/3 - Maintain  MAP > 60 - wean  glucagon  - Echo results as above - Trend  check LFT's - EKG prn - PICC placement 11/3     Anion gap metabolic acidosis with lactic acidosis.>> resolved Acute renal failure with ATN.>> resolving Hypokalemia,>> resolved hypophosphatemia,>> replete 11/3 hypomagnesemia.>> repleted 11/3 by E Link Positive 11 L Plan: - trend  BMET - replace electrolytes as needed - Monitor  UO - re-check phos and mag 11/4   DM type II. Insulin gtt d/c'd - wean glucagon to off  - CBG's Q 4 - Continue dextrose until CBG's stable off glucagon  Acute metabolic encephalopathy.>> improving Presumed suicide attempt with intentional overdose. - monitor mental status - will need psychiatry assessment when medically stable  Pulmonary Remains on 2 L [place] with sats of 91% Positive 11 L CXR 11/3>> Interval development of hazy opacity at the right base with obscuration the right hemidiaphragm in the right heart border. Interval development of opacity at the left base partially obscuring the left hemidiaphragm  Plan: CXR now>> concern for pulmonary edema with decrease in sats/ rales Decrease IVF Consider gentle lasix based on CXR Mobilize  Aggressive pulmonary toilet IS  Q 1 while awake   GI New Abdominal Pain Plan - KUB - Heart healthy diet  DVT prophylaxis - SQ heparin SUP - not indicated Nutrition - Heart Healthy diet Goals of care - full code  Magdalen Spatz, AGACNP-BC [place] 05/22/2017, 7:38 AM Pager:  [phone]  Attending Note:  66 year old female with PMH above that was found unresponsive after taking CCB overdose intentionally to commit suicide.  Patient was admitted to the ICU for epi drip and glucagon drip.  Patient responded well over the last 3 days and now epi is decreasing almost to off but has glucagon drip still going.  On exam, lungs are clear.  I reviewed CXR myself, pleural effusions noted.  Will begin lasix today gently given hypotension.  Decrease IVF to D10 at 50 ml/hr for hypoglycemia.  Hold in the ICU.  Will need psych to evaluate patient prior to discharge.  Suicide sitter.  Hold in the ICU until off all drips.  The patient is critically ill with multiple organ systems failure and requires high complexity decision making for assessment and support, frequent evaluation and titration of therapies, application of  advanced monitoring technologies and extensive interpretation of multiple databases.   Critical Care Time devoted to patient care services described in this note is  35  Minutes. This time reflects time of care of this signee Dr Jennet Maduro. This critical care time does not reflect procedure time, or teaching time or supervisory time of PA/NP/Med student/Med Resident etc but could involve care discussion time.  Rush Farmer, M.D. [facility] Pulmonary/Critical Care Medicine. Pager: [phone]. After hours pager: [phone].

## 2017-05-22 NOTE — Progress Notes (Signed)
Progress Note  Patient Name: Rachel White Date of Encounter: 05/22/2017  Primary Cardiologist: Dr Ellyn Hack  Subjective   Pt complains of abdominal pain, hand pain, feet pain and dyspnea  Inpatient Medications    Scheduled Meds: . chlorhexidine  15 mL Mouth Rinse BID  . Chlorhexidine Gluconate Cloth  6 each Topical Daily  . gabapentin  300 mg Oral TID  . heparin  5,000 Units Subcutaneous Q8H  . Influenza vac split quadrivalent PF  0.5 mL Intramuscular Tomorrow-1000  . insulin aspart  0-9 Units Subcutaneous Q4H  . mouth rinse  15 mL Mouth Rinse q12n4p  . sodium chloride flush  10-40 mL Intracatheter Q12H  . sodium chloride flush  10-40 mL Intracatheter Q12H   Continuous Infusions: . sodium chloride    . sodium chloride 125 mL/hr at 05/22/17 0409  . dextrose 5 % and 0.9% NaCl    . epinephrine Stopped (05/21/17 1200)  . glucagon (GLUCAGEN) infusion (for beta [place]/calcium channel [place] overdose) 5 mg/hr (05/22/17 0007)  . norepinephrine (LEVOPHED) Adult infusion Stopped (05/21/17 2349)  . sodium phosphate  Dextrose 5% IVPB     PRN Meds: sodium chloride, acetaminophen, dextrose, fentaNYL (SUBLIMAZE) injection, lip balm, ondansetron (ZOFRAN) IV, senna, sodium chloride flush, sodium chloride flush   Vital Signs    Vitals:   05/22/17 0400 05/22/17 0500 05/22/17 0600 05/22/17 0748  BP:    107/63  Pulse: 87 82 82 82  Resp: (!) 21 (!) 21 17 16   Temp: 97.7 F (36.5 C) 97.9 F (36.6 C) 97.9 F (36.6 C) 98.1 F (36.7 C)  TempSrc: Core (Comment) Core (Comment) Core (Comment) Core (Comment)  SpO2: 92% 95% 91% 96%  Weight:  190 lb 14.7 oz (86.6 kg)    Height:        Intake/Output Summary (Last 24 hours) at 05/22/17 0815 Last data filed at 05/22/17 2505  Gross per 24 hour  Intake          8967.26 ml  Output             9375 ml  Net          -407.74 ml   Filed Weights   05/19/17 1727 05/21/17 0500 05/22/17 0500  Weight: 170 lb (77.1 kg) 179 lb 3.7 oz (81.3 kg)  190 lb 14.7 oz (86.6 kg)    Telemetry    Sinus  - Personally Reviewed   Physical Exam   GEN: No acute distress.   Neck: No JVD Cardiac: RRR, 2/6 systolic murmur LSB Respiratory: Clear to auscultation bilaterally. GI: Soft, mild diffuse tenderness to palpation. MS: 1+edema; No deformity. Neuro:  Nonfocal   Labs    Chemistry Recent Labs Lab 05/19/17 1730  05/20/17 0134  05/21/17 0000 05/21/17 0509 05/21/17 0816 05/22/17 0511  NA 136  < > 135  < > 135 135  --  133*  K 4.2  < > 2.2*  < > 3.7 3.6  --  4.7  CL 107  < > 111  < > 108 107  --  109  CO2 18*  --  15*  < > 21* 22  --  20*  GLUCOSE 188*  < > 308*  < > 300* 228*  --  134*  BUN 25*  < > 30*  < > 23* 21*  --  10  CREATININE 1.58*  < > 1.61*  < > 1.44* 1.27*  --  0.73  CALCIUM 12.6*  --  9.7  < > 8.2*  8.1*  --  8.2*  PROT 6.3*  --   --   --   --   --  4.5* 4.9*  ALBUMIN 3.6  --  2.8*  --   --   --  2.5* 2.6*  AST 33  --   --   --   --   --  32 48*  ALT 28  --   --   --   --   --  31 38  ALKPHOS 49  --   --   --   --   --  40 52  BILITOT 0.4  --   --   --   --   --  0.8 0.5  GFRNONAA 33*  < > 32*  < > 37* 43*  --  >60  GFRAA 38*  < > 37*  < > 42* 49*  --  >60  ANIONGAP 11  --  9  < > 6 6  --  4*  < > = values in this interval not displayed.   Hematology Recent Labs Lab 05/20/17 0557 05/20/17 0809 05/21/17 0509 05/22/17 0511  WBC 24.5*  --  22.0* 13.7*  RBC 4.15  --  4.05 4.23  HGB 11.3* 11.2* 10.9* 11.3*  HCT 34.2* 33.0* 32.8* 34.7*  MCV 82.4  --  81.0 82.0  MCH 27.2  --  26.9 26.7  MCHC 33.0  --  33.2 32.6  RDW 13.7  --  14.2 15.3  PLT 248  --  163 123*    Cardiac Enzymes Recent Labs Lab 05/19/17 1730 05/20/17 0135  TROPONINI <0.03 0.04*     Patient Profile     66 y.o. female admitted with bradycardia and hypotension after intentional amlodipine overdose.  Assessment & Plan    1 bradycardia-felt secondary to amlodipine overdose. Pacer wire has been removed. Heart rate is stable.  Pressors are now off. Echocardiogram shows normal LV function. No plans for further cardiac evaluation.  2 status post intentional amlodipine overdose- Plan is for inpatient psychiatric admission after medically stable.  3 volume excess-patient now appears to be volume overloaded. Agree with discontinuing IV fluids and may need gentle diuresis.  4 diabetes mellitus-management per primary team.   5 abdominal pain-KUB has been ordered. Further evaluation for primary care.   We will sign off. Please call with questions.   For questions or updates, please contact [place] Please consult [email or web address] for contact info under Cardiology/STEMI.      Signed, Kirk Ruths, MD  05/22/2017, 8:15 AM

## 2017-05-23 ENCOUNTER — Inpatient Hospital Stay (HOSPITAL_COMMUNITY): Payer: Medicare Other

## 2017-05-23 DIAGNOSIS — E162 Hypoglycemia, unspecified: Secondary | ICD-10-CM

## 2017-05-23 DIAGNOSIS — T50902S Poisoning by unspecified drugs, medicaments and biological substances, intentional self-harm, sequela: Secondary | ICD-10-CM

## 2017-05-23 DIAGNOSIS — R579 Shock, unspecified: Secondary | ICD-10-CM

## 2017-05-23 DIAGNOSIS — J90 Pleural effusion, not elsewhere classified: Secondary | ICD-10-CM

## 2017-05-23 LAB — GLUCOSE, CAPILLARY
GLUCOSE-CAPILLARY: 100 mg/dL — AB (ref 65–99)
GLUCOSE-CAPILLARY: 109 mg/dL — AB (ref 65–99)
GLUCOSE-CAPILLARY: 122 mg/dL — AB (ref 65–99)
Glucose-Capillary: 128 mg/dL — ABNORMAL HIGH (ref 65–99)
Glucose-Capillary: 98 mg/dL (ref 65–99)
Glucose-Capillary: 85 mg/dL (ref 65–99)

## 2017-05-23 LAB — CBC
HCT: 37.7 % (ref 36.0–46.0)
Hemoglobin: 12.2 g/dL (ref 12.0–15.0)
MCH: 26.7 pg (ref 26.0–34.0)
MCHC: 32.4 g/dL (ref 30.0–36.0)
MCV: 82.5 fL (ref 78.0–100.0)
Platelets: 132 10*3/uL — ABNORMAL LOW (ref 150–400)
RBC: 4.57 MIL/uL (ref 3.87–5.11)
RDW: 14.8 % (ref 11.5–15.5)
WBC: 9.5 10*3/uL (ref 4.0–10.5)

## 2017-05-23 LAB — MAGNESIUM: Magnesium: 1.5 mg/dL — ABNORMAL LOW (ref 1.7–2.4)

## 2017-05-23 LAB — PHOSPHORUS: Phosphorus: 3 mg/dL (ref 2.5–4.6)

## 2017-05-23 LAB — COMPREHENSIVE METABOLIC PANEL
ALK PHOS: 65 U/L (ref 38–126)
ALT: 43 U/L (ref 14–54)
AST: 42 U/L — ABNORMAL HIGH (ref 15–41)
Albumin: 2.8 g/dL — ABNORMAL LOW (ref 3.5–5.0)
Anion gap: 8 (ref 5–15)
BUN: 10 mg/dL (ref 6–20)
CALCIUM: 8.9 mg/dL (ref 8.9–10.3)
CO2: 27 mmol/L (ref 22–32)
CREATININE: 1.04 mg/dL — AB (ref 0.44–1.00)
Chloride: 103 mmol/L (ref 101–111)
GFR, EST NON AFRICAN AMERICAN: 54 mL/min — AB (ref >60)
Glucose, Bld: 120 mg/dL — ABNORMAL HIGH (ref 65–99)
Potassium: 3.6 mmol/L (ref 3.5–5.1)
Sodium: 138 mmol/L (ref 135–145)
Total Bilirubin: 0.7 mg/dL (ref 0.3–1.2)
Total Protein: 5.9 g/dL — ABNORMAL LOW (ref 6.5–8.1)

## 2017-05-23 MED ORDER — ORAL CARE MOUTH RINSE: 15.0000 mL | Freq: Two times a day (BID) | OROMUCOSAL | Status: DC

## 2017-05-23 MED ORDER — POTASSIUM CHLORIDE 20 MEQ/15ML (10%) PO SOLN
40.0000 meq | Freq: Once | ORAL | Status: DC
  Filled 2017-05-23: qty 30

## 2017-05-23 MED ORDER — MAGNESIUM SULFATE 2 GM/50ML IV SOLN
2.0000 g | Freq: Once | INTRAVENOUS | Status: AC
  Administered 2017-05-23: 2 g via INTRAVENOUS
  Filled 2017-05-23: qty 50

## 2017-05-23 MED ORDER — POTASSIUM CHLORIDE CRYS ER 20 MEQ PO TBCR
40.0000 meq | EXTENDED_RELEASE_TABLET | Freq: Once | ORAL | Status: AC
  Administered 2017-05-23: 40 meq via ORAL
  Filled 2017-05-23: qty 2

## 2017-05-23 NOTE — Progress Notes (Signed)
PULMONARY / CRITICAL CARE MEDICINE   Name: Rachel White MRN: [id number] DOB: 04-Jul-1951    ADMISSION DATE:  05/19/2017 CONSULTATION DATE:  05/19/17  REFERRING MD:  Kathrynn Humble  CHIEF COMPLAINT:  AMS after calcium channel overdose  HISTORY OF PRESENT ILLNESS:   66 yo female smoker found unresponsive at home by family, and found to have empty pill bottles including amlodipine.  She was found to have heart rate of 20.  SUBJECTIVE: Awake and alert, complains of generalized pain. States her hands are less swollen.    VITAL SIGNS: BP (!) 100/55   Pulse 86   Temp 98.3 F (36.8 C) (Oral)   Resp 14   Ht 5\' 6"  (1.676 m)   Wt 172 lb 2.9 oz (78.1 kg)   SpO2 95%   BMI 27.79 kg/m   INTAKE / OUTPUT: I/O last 3 completed shifts: In: 11601.3 [P.O.:480; I.V.:10088.7; IV Piggyback:1032.7] Out: 63016 [[id number]]  PHYSICAL EXAMINATION:  General - alert, awake and following commands. Complaining of generalized pain Eyes - PERRLA ENT - no sinus tenderness, no oral exudate, no LAN Cardiac - S1, S2, RRR, No RMG Chest - no wheeze, fine rales per bases , diminished per bases, No distress, remains on 2L [place] Abd - soft, non-tender, , non-distended, BS quiet Ext - no  edema, warm and dry, some L shoulder tenderness, but full ROM Skin - no rashes, lesions , warm dry and intact, abrasions L arm Neuro - normal strength, flat affect, MAE x 4  LABS:  BMET Recent Labs  Lab 05/21/17 0509 05/22/17 0511 05/23/17 0459  NA 135 133* 138  K 3.6 4.7 3.6  CL 107 109 103  CO2 22 20* 27  BUN 21* 10 10  CREATININE 1.27* 0.73 1.04*  GLUCOSE 228* 134* 120*   Electrolytes Recent Labs  Lab 05/21/17 0509 05/22/17 0511 05/23/17 0459  CALCIUM 8.1* 8.2* 8.9  MG 1.4* 2.0 1.5*  PHOS 2.7 2.1* 3.0   CBC Recent Labs  Lab 05/21/17 0509 05/22/17 0511 05/23/17 0459  WBC 22.0* 13.7* 9.5  HGB 10.9* 11.3* 12.2  HCT 32.8* 34.7* 37.7  PLT 163 123* 132*   Coag's No results for input(s): APTT, INR  in the last 168 hours.  Sepsis Markers Recent Labs  Lab 05/19/17 1754 05/20/17 0557  LATICACIDVEN 4.00* 6.6*   ABG Recent Labs  Lab 05/19/17 1743 05/19/17 2257  PHART 7.328* 7.379  PCO2ART 34.1 30.5*  PO2ART 66.0* 71.0*   Liver Enzymes Recent Labs  Lab 05/21/17 0816 05/22/17 0511 05/23/17 0459  AST 32 48* 42*  ALT 31 38 43  ALKPHOS 40 52 65  BILITOT 0.8 0.5 0.7  ALBUMIN 2.5* 2.6* 2.8*   Cardiac Enzymes Recent Labs  Lab 05/19/17 1730 05/20/17 0135  TROPONINI <0.03 0.04*   Glucose Recent Labs  Lab 05/22/17 1615 05/22/17 1850 05/22/17 1959 05/22/17 2229 05/23/17 0021 05/23/17 0510  GLUCAP 138* 136* 128* 116* 122* 128*   Imaging Dg Chest Port 1 View  Result Date: 05/22/2017 CLINICAL DATA:  66 year old female with a history of respiratory failure EXAM: PORTABLE CHEST 1 VIEW COMPARISON:  05/19/2017 FINDINGS: Cardiomediastinal silhouette unchanged. Interval placement of right upper extremity PICC with the tip appearing to terminate at the superior vena cava. Interval development of hazy opacity at the right base with obscuration the right hemidiaphragm in the right heart border. Interval development of opacity at the left base partially obscuring the left hemidiaphragm. IMPRESSION: Right greater than left pleural effusion with associated atelectasis. Superimposed infection cannot  be excluded. Interval placement of right-sided PICC. Electronically Signed   By: Corrie Mckusick D.O.   On: 05/22/2017 09:55   Dg Abd Portable 1v  Result Date: 05/22/2017 CLINICAL DATA:  66 year old female with a history of abdominal pain EXAM: PORTABLE ABDOMEN - 1 VIEW COMPARISON:  CT 03/31/2016 FINDINGS: Gas within small bowel and colon.  No abnormal distention. Paucity of gas within the stomach lumen. Surgical changes again noted in the left upper quadrant. No unexpected calcifications or radiopaque foreign body. Pelvic thermister. IMPRESSION: Nonobstructive bowel gas pattern. Surgical  changes of the left upper quadrant. Electronically Signed   By: Corrie Mckusick D.O.   On: 05/22/2017 09:56   STUDIES:  05/20/2017>> The cavity size was normal. Wall thickness was   normal. Systolic function was vigorous. EF:  65% to 70%. MV: Mild regurgitation  SIGNIFICANT EVENTS: 10/31 admit, temporary pacer inserted 11/2>> continued to require maximal pressor support>> drug half life is 50-60 hours 11/3>> Off all pressors, weaning glucagon, lasix  LINES/TUBES: Rt femoral sheath 10/31 >>  05/21/2017>> External pacer removed 11/2>> PICC  DISCUSSION: 66 y.o. female admitted 10/31 after intentional  overdose of calcium channel blockers, after recent death of a family member.  ASSESSMENT / PLAN:  Overdose of calcium channel [place] with resulting cardiogenic shock. Medication half life 50-60 hours Levo/ Epi off as of 11/3 Insulin gtt off as of 11/3 - Maintain  MAP > 60 - Wean  Glucagon while monitoring for hypoglycemia  - Echo results as above - Trend  check LFT's - EKG prn - PICC placement 11/3  Anion gap metabolic acidosis with lactic acidosis.>> resolved Acute renal failure with ATN.>> resolving Hypokalemia, hypophosphatemia,>> replete 11/3 hypomagnesemia.>> repleted 11/4 by E Link Negative 8 L last 24>> net + 3 L since admit  Plan: - Trend  BMET - Replace electrolytes as needed ( K+ and Mag repleted 11/4) - Monitor UO - Re-check  mag 11/5  DM type II. Insulin gtt d/c'd Glucagon D/C's 11/3 Remains on D10 with CBG's in 120's Poor Appetite Plan: - CBG's Q 4 - Continue dextrose until CBG's stable off glucagon - Encourage po's  Acute metabolic encephalopathy.>> improving Presumed suicide attempt with intentional overdose. - Monitor mental status - Will need psychiatry assessment when medically stable  Pulmonary Remains on 2 L [place] with sats of 91% Positive 3 L since admit after lasix 11/3 CXR 11/4 >> Interval development of hazy opacity at the right base  with obscuration the right hemidiaphragm in the right heart border. Interval development of opacity at the left base partially obscuring the left hemidiaphragm  Plan: CXR prn>> effusions noted 11/3 Decrease IVF Hold lasix 11/4 since BP soft Mobilize  Aggressive pulmonary toilet IS  Q 1 while awake   GI New Abdominal Pain KUB indicated gas Plan - Mobilize - Heart healthy diet  Suicide attempt Suspect intentional after loss of brother Psych eval 11/1 with following recommendations:  Treatment Plan Summary: MDD, single episode, severe, without psychosis: -Recommend inpatient psychiatric hospitalization following medical clearance. -Please pursue involuntary commitment if patient refuses voluntary psychiatric hospitalization. -Please have unit SW find appropriate psychiatric bed once patient is medically cleared.  -Continue sitter given risk of harm to self with ongoing suicidal ideation. -Patient declines medication management for depression at this time. -Will sign off on patient at this time. Please consult psychiatry as needed.  Plan: Consult to SW for inpatient psych bed Pursue voluntary commitment- if refuses will need involuntary committment Continue suicide sitters  DVT prophylaxis -  SQ heparin SUP - not indicated Nutrition - Heart Healthy diet Goals of care - full code  NSR, off glucagon, remains on D10 for blood sugars and poor appetite.Good diuresis after lasix x 1 dose. BP soft overnight, will hold lasix 11/4.  Psych recommends inpatient psych bed.  Continue suicide sitters until transferred to inpatient psychiatric hospital. Transfer to SDU with sitters.Magdalen Spatz, AGACNP-BC [facility] 05/23/2017, 7:33 AM Pager:  [phone]  Attending Note:  66 year old female with PMH above that was found unresponsive after taking CCB overdose intentionally to commit suicide.  Patient is now off glucagon and epi drips that are now off.  Psych  evaluated the chart and recommended inpatient psych admit on Monday.  On exam, lungs are clear.  I reviewed CXR myself, bilateral pleural effusion.  Discussed with PCCM-NP.  CCB overdose:  - D/C glucagon  - Tele monitoring  Hypoglycemia:  - Diet  - Dextrose 10 at 50 ml/hr  Suicide attempt:  - Inpatient psych in AM  - Can not be discharged  - Suicide precautions and suicide sitter  Pleural effusion:  - Lasix 40 mg IV x1  Transfer patient to SDU but keep on PCCM service since will likely be discharged in AM.  Patient seen and examined, agree with above note.  I dictated the care and orders written for this patient under my direction.  Rush Farmer, [place]

## 2017-05-24 ENCOUNTER — Inpatient Hospital Stay
Admission: AD | Admit: 2017-05-24 | Discharge: 2017-05-28 | DRG: 885 | Disposition: A | Discharge status: Home / Self Care | Payer: Medicare Other | Attending: Psychiatry | Admitting: Psychiatry

## 2017-05-24 ENCOUNTER — Encounter: Payer: Self-pay | Admitting: Psychiatry

## 2017-05-24 ENCOUNTER — Other Ambulatory Visit: Payer: Self-pay

## 2017-05-24 ENCOUNTER — Inpatient Hospital Stay (HOSPITAL_COMMUNITY): Payer: Medicare Other

## 2017-05-24 DIAGNOSIS — Z9119 Patient's noncompliance with other medical treatment and regimen: Secondary | ICD-10-CM

## 2017-05-24 DIAGNOSIS — F17200 Nicotine dependence, unspecified, uncomplicated: Secondary | ICD-10-CM | POA: Diagnosis present

## 2017-05-24 DIAGNOSIS — K529 Noninfective gastroenteritis and colitis, unspecified: Secondary | ICD-10-CM | POA: Diagnosis present

## 2017-05-24 DIAGNOSIS — I10 Essential (primary) hypertension: Secondary | ICD-10-CM | POA: Diagnosis present

## 2017-05-24 DIAGNOSIS — E785 Hyperlipidemia, unspecified: Secondary | ICD-10-CM | POA: Diagnosis present

## 2017-05-24 DIAGNOSIS — F17210 Nicotine dependence, cigarettes, uncomplicated: Secondary | ICD-10-CM | POA: Diagnosis present

## 2017-05-24 DIAGNOSIS — R402413 Glasgow coma scale score 13-15, at hospital admission: Secondary | ICD-10-CM | POA: Diagnosis present

## 2017-05-24 DIAGNOSIS — G4700 Insomnia, unspecified: Secondary | ICD-10-CM | POA: Diagnosis present

## 2017-05-24 DIAGNOSIS — F429 Obsessive-compulsive disorder, unspecified: Secondary | ICD-10-CM | POA: Diagnosis present

## 2017-05-24 DIAGNOSIS — Z791 Long term (current) use of non-steroidal anti-inflammatories (NSAID): Secondary | ICD-10-CM | POA: Diagnosis not present

## 2017-05-24 DIAGNOSIS — E1140 Type 2 diabetes mellitus with diabetic neuropathy, unspecified: Secondary | ICD-10-CM | POA: Diagnosis present

## 2017-05-24 DIAGNOSIS — Z915 Personal history of self-harm: Secondary | ICD-10-CM

## 2017-05-24 DIAGNOSIS — F322 Major depressive disorder, single episode, severe without psychotic features: Secondary | ICD-10-CM | POA: Diagnosis not present

## 2017-05-24 DIAGNOSIS — M549 Dorsalgia, unspecified: Secondary | ICD-10-CM | POA: Diagnosis present

## 2017-05-24 DIAGNOSIS — G629 Polyneuropathy, unspecified: Secondary | ICD-10-CM | POA: Diagnosis present

## 2017-05-24 DIAGNOSIS — G4730 Sleep apnea, unspecified: Secondary | ICD-10-CM | POA: Diagnosis present

## 2017-05-24 DIAGNOSIS — Z9884 Bariatric surgery status: Secondary | ICD-10-CM | POA: Diagnosis not present

## 2017-05-24 DIAGNOSIS — F329 Major depressive disorder, single episode, unspecified: Secondary | ICD-10-CM | POA: Diagnosis present

## 2017-05-24 DIAGNOSIS — Z888 Allergy status to other drugs, medicaments and biological substances status: Secondary | ICD-10-CM

## 2017-05-24 DIAGNOSIS — Z882 Allergy status to sulfonamides status: Secondary | ICD-10-CM

## 2017-05-24 DIAGNOSIS — T46901A Poisoning by unspecified agents primarily affecting the cardiovascular system, accidental (unintentional), initial encounter: Secondary | ICD-10-CM | POA: Diagnosis present

## 2017-05-24 DIAGNOSIS — T461X1A Poisoning by calcium-channel blockers, accidental (unintentional), initial encounter: Secondary | ICD-10-CM | POA: Diagnosis present

## 2017-05-24 DIAGNOSIS — Z79899 Other long term (current) drug therapy: Secondary | ICD-10-CM | POA: Diagnosis not present

## 2017-05-24 DIAGNOSIS — F323 Major depressive disorder, single episode, severe with psychotic features: Principal | ICD-10-CM | POA: Diagnosis present

## 2017-05-24 DIAGNOSIS — T50902A Poisoning by unspecified drugs, medicaments and biological substances, intentional self-harm, initial encounter: Secondary | ICD-10-CM | POA: Diagnosis present

## 2017-05-24 DIAGNOSIS — Z9181 History of falling: Secondary | ICD-10-CM

## 2017-05-24 LAB — GLUCOSE, CAPILLARY
GLUCOSE-CAPILLARY: 122 mg/dL — AB (ref 65–99)
GLUCOSE-CAPILLARY: 86 mg/dL (ref 65–99)
Glucose-Capillary: 84 mg/dL (ref 65–99)
Glucose-Capillary: 96 mg/dL (ref 65–99)

## 2017-05-24 LAB — CBC
HEMATOCRIT: 36.6 % (ref 36.0–46.0)
Hemoglobin: 12.1 g/dL (ref 12.0–15.0)
MCH: 27.2 pg (ref 26.0–34.0)
MCHC: 33.1 g/dL (ref 30.0–36.0)
MCV: 82.2 fL (ref 78.0–100.0)
Platelets: 142 10*3/uL — ABNORMAL LOW (ref 150–400)
RBC: 4.45 MIL/uL (ref 3.87–5.11)
RDW: 14.4 % (ref 11.5–15.5)
WBC: 7.1 10*3/uL (ref 4.0–10.5)

## 2017-05-24 LAB — BASIC METABOLIC PANEL
Anion gap: 5 (ref 5–15)
BUN: 13 mg/dL (ref 6–20)
CHLORIDE: 106 mmol/L (ref 101–111)
CO2: 28 mmol/L (ref 22–32)
CREATININE: 1.08 mg/dL — AB (ref 0.44–1.00)
Calcium: 8.9 mg/dL (ref 8.9–10.3)
GFR calc Af Amer: >60 mL/min (ref >60)
GFR calc non Af Amer: 52 mL/min — ABNORMAL LOW (ref >60)
GLUCOSE: 78 mg/dL (ref 65–99)
Potassium: 4.4 mmol/L (ref 3.5–5.1)
Sodium: 139 mmol/L (ref 135–145)

## 2017-05-24 LAB — MAGNESIUM: Magnesium: 1.9 mg/dL (ref 1.7–2.4)

## 2017-05-24 MED ORDER — LISINOPRIL 20 MG PO TABS
10.0000 mg | ORAL_TABLET | Freq: Every day | ORAL | Status: DC
  Administered 2017-05-25 – 2017-05-28 (×4): 10 mg via ORAL
  Filled 2017-05-24 (×4): qty 1

## 2017-05-24 MED ORDER — MELOXICAM 7.5 MG PO TABS
15.0000 mg | ORAL_TABLET | Freq: Every day | ORAL | Status: DC
  Administered 2017-05-25 – 2017-05-28 (×4): 15 mg via ORAL
  Filled 2017-05-24 (×4): qty 2

## 2017-05-24 MED ORDER — ACETAMINOPHEN 325 MG PO TABS
650.0000 mg | ORAL_TABLET | Freq: Four times a day (QID) | ORAL | Status: DC | PRN
  Administered 2017-05-27 – 2017-05-28 (×2): 650 mg via ORAL
  Filled 2017-05-24 (×2): qty 2

## 2017-05-24 MED ORDER — NICOTINE 21 MG/24HR TD PT24
21.0000 mg | MEDICATED_PATCH | Freq: Every day | TRANSDERMAL | Status: DC
  Filled 2017-05-24: qty 1

## 2017-05-24 MED ORDER — DIPHENHYDRAMINE HCL 25 MG PO CAPS
50.0000 mg | ORAL_CAPSULE | Freq: Every evening | ORAL | Status: DC | PRN
  Administered 2017-05-25 – 2017-05-27 (×2): 50 mg via ORAL
  Filled 2017-05-24 (×2): qty 2

## 2017-05-24 MED ORDER — ATORVASTATIN CALCIUM 20 MG PO TABS
10.0000 mg | ORAL_TABLET | Freq: Every day | ORAL | Status: DC
  Administered 2017-05-25 – 2017-05-28 (×4): 10 mg via ORAL
  Filled 2017-05-24 (×4): qty 1

## 2017-05-24 MED ORDER — MAGNESIUM HYDROXIDE 400 MG/5ML PO SUSP: 30.0000 mL | Freq: Every day | ORAL | Status: DC | PRN

## 2017-05-24 MED ORDER — ALUM & MAG HYDROXIDE-SIMETH 200-200-20 MG/5ML PO SUSP: 30.0000 mL | ORAL | Status: DC | PRN

## 2017-05-24 NOTE — Progress Notes (Signed)
CSW  Received consult for inpatient psych placement when pt is medically stable.  Please document clearly when pt is stable for DC so referral process can be started  Jorge Ny, [place] Social Worker [phone]

## 2017-05-24 NOTE — Progress Notes (Signed)
   05/24/17 1726  Section 1: Provider Certification  Patient Condition Patient stabilized  Reason for Transfer Inpatient to [facility]  Sending Physician SOOD, V.  Section 2: [facility]  [place]  Transfer Coordinator - name, # Eliezer Lofts   Report to Next Provider Polly Cobia  Date report called 05/24/17  Time report called 1725  Transport By [facility]  Patient Belongings Disposition Other (Comment) (Personal phone)  E - Vitals (15 min before transfer)  Temp 98.6 F (37 C)  Pulse Rate 96  Resp 20  BP 134/61  SpO2 98 %

## 2017-05-24 NOTE — Progress Notes (Signed)
CSW made inpt psych referral to [facility]- they report they anticipate having multiple DCs today so will likely have a bed as long as patient is medically approved  CSW awaiting determination from [facility] to confirm bed  Jorge Ny, [place] Worker [phone]

## 2017-05-24 NOTE — Progress Notes (Signed)
Report given to 5W nurse. Patient ready to transport.

## 2017-05-24 NOTE — Progress Notes (Signed)
Patient transported to [phone] via w/c with safety sitter. All personal belongings sent with her.

## 2017-05-24 NOTE — Progress Notes (Signed)
Patient ID: Rachel White, female   DOB: June 29, 1951, 66 y.o.   MRN: [id number] Patient is a 66 year old female who presented to ED after overdosing on Norvasc. Patient was found in the living room by her husband, unresponsive. Patient reports that she has been experiencing depression after her brother's death. Brother died 4 months ago.  Patients reports that she had his custody and she is missing him in her life. Denies previous treatment for depression. She presents with history of health issues including DM (without complication), HTN, pneumonia, Neuropathic pain, chronic back pain etc. Skin assessment performed by this nurse, assisted by Catalina Antigua, RN.  Patient  presents with pitting edema 3+ in both feet. She has multiple  scars and bruises on both arms, reporting that they are related to a recent fall. Patient admitted and oriented to the unit. Safety precautions initiated. Will be evaluated by MD in AM.

## 2017-05-24 NOTE — Progress Notes (Signed)
Report called to Polly Cobia at [facility] [phone].

## 2017-05-24 NOTE — Consult Note (Signed)
[facility] Face-to-Face Psychiatry Consult   Reason for Consult: Suicide risk assessment Referring Physician:  Dr. Halford Chessman Patient Identification: Rachel White MRN:  [id number] Principal Diagnosis: MDD (major depressive disorder), single episode, severe , no psychosis ([place])   Diagnosis:   Patient Active Problem List   Diagnosis Date Noted  . Intentional drug overdose ([place]) [T50.902A]   . MDD (major depressive disorder), single episode, severe , no psychosis ([place]) [F32.2] 05/20/2017  . Overdose of calcium-channel [place] [T46.1X1A] 05/19/2017  . Overdose of cardiovascular agent [T46.901A] 05/19/2017  . Shock circulatory ([place]) [R57.9]   . Symptomatic bradycardia [R00.1]   . Acute blood loss anemia [D62] 11/10/2015  . Severe epistaxis [R04.0] 11/09/2015  . Epistaxis [R04.0] 11/08/2015  . Dehydration [E86.0] 10/26/2015  . Acute renal failure (ARF) ([place]) [N17.9] 10/26/2015  . Hypotension [I95.9] 10/26/2015  . Hypokalemia [E87.6] 10/26/2015  . Diabetes mellitus ([place]) [E11.9] 10/26/2015  . Sleep apnea [G47.30] 10/26/2015  . Diarrhea [R19.7] 10/26/2015  . Fracture of left tibial plateau [S82.142A] 10/19/2014  . Tibial plateau fracture [S82.143A] 10/19/2014  . Peripheral neuropathy ([place]) [G62.9] 08/01/2012  . Insomnia [G47.00] 08/01/2012  . Chronic diarrhea [K52.9] 08/01/2012    Total Time spent with patient: 45 minutes  Subjective:   Rachel White is a 66 y.o. female patient admitted with intentional overdose.   HPI:   According to records, Rachel White was found by her husband unresponsive at home on the couch with several open pill bottles. In the ED, she required insulin and IV fluids in the setting of bradycardia and hypotension. She also required pacing with external pads. On interview, Rachel White reports that she intentionally overdosed on her prescribed medications (Lorazepam and blood pressure medications) yesterday due to SI. She reports not remembering anything after taking  them until her husband came home and found her unresponsive on the couch. She reports SI and hopelessness today when asked if she wishes that she had completed her attempt. She reports grieving the loss of her brother. He died 4 months ago from cirrhotic liver complications. She was his caregiver for the past 6 years as his condition gradually worsened. She was the person to decide to terminate his care at the end of his life. She reports that her mood has been down since he passed away. She reports anhedonia. She previously enjoyed being a homemaker. She reports hypersomnia but denies problems with appetite or concentration.    Past Psychiatric History: Denies   May 24, 2017 Interval history: Patient seen, chart reviewed for this face-to-face psychiatric consultation follow-up as requested by primary team.  Patient is awake, alert, oriented to time place person and situation.  Patient endorses severe symptoms of depression and a status post intentional overdose secondary to loss of her brother in July 2018 secondary to alcoholic cirrhosis.  Patient reportedly had 3 brothers and a father with a similar problems and passed away with alcohol-related complications.  Patient denies previous history of depression and antidepressant medication treatment.  Patient is willing to participate acute psychiatric hospitalization.  Patient has intact ADLs and continues to be depressed and emotional during this evaluation and talking about her brother's death.  She cannot contract for safety at this time so we will continue her safety  sitter at this time.  Risk to Self: Yes Risk to Others:  No Prior Inpatient Therapy:  Denies  Prior Outpatient Therapy:  Denies   Past Medical History:  Past Medical History:  Diagnosis Date  . Complication of anesthesia  some difficulty waking up after tubes tied  . Diabetes mellitus without complication ([name])    diet controlled  . Headache    migraines years ago  .  Hypertension   . Irritable bowel syndrome    under control  . Neuropathic pain   . Pneumonia    as a child  . Sleep apnea    does not use C_pap    Past Surgical History:  Procedure Laterality Date  . DILATION AND CURETTAGE OF UTERUS    . FOOT SURGERY Left   . GASTRIC BYPASS    . TUBAL LIGATION     Family History:  Family History  Problem Relation Age of Onset  . Cancer Mother   . Cancer Other   . COPD Other   . Diabetes type II Neg Hx    Family Psychiatric  History: Denies  Social History:  Social History   Substance and Sexual Activity  Alcohol Use No  . Alcohol/week: 0.0 oz     Social History   Substance and Sexual Activity  Drug Use No    Social History   Socioeconomic History  . Marital status: Married    Spouse name: None  . Number of children: None  . Years of education: None  . Highest education level: None  Social Needs  . Financial resource strain: None  . Food insecurity - worry: None  . Food insecurity - inability: None  . Transportation needs - medical: None  . Transportation needs - non-medical: None  Occupational History  . None  Tobacco Use  . Smoking status: Current Every Day Smoker    Packs/day: 1.00    Years: 41.00    Pack years: 41.00    Types: Cigarettes  . Smokeless tobacco: Never Used  Substance and Sexual Activity  . Alcohol use: No    Alcohol/week: 0.0 oz  . Drug use: No  . Sexual activity: None  Other Topics Concern  . None  Social History Narrative  . None   Additional Social History: She denies alcohol or illicit substance use. She previously smoked 1 ppd x 15 years. She quit 3 months ago. She lives at home with her husband (married 33 years) and 6 y/o son. She is retired. 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     Allergies:   Allergies  Allergen Reactions  . Sulfa Antibiotics Other (See Comments)    Pt. Does not recall the reaction   . Trazodone Other (See Comments)    Visual  Disturbances as well as Tingling sensation  . Zolpidem Tartrate Other (See Comments)    Altered mental status     Labs:  Results for orders placed or performed during the hospital encounter of 05/19/17 (from the past 48 hour(s))  Glucose, capillary     Status: Abnormal   Collection Time: 05/22/17  4:15 PM  Result Value Ref Range   Glucose-Capillary 138 (H) 65 - 99 mg/dL   Comment 1 Arterial Specimen   Glucose, capillary     Status: Abnormal   Collection Time: 05/22/17  6:50 PM  Result Value Ref Range   Glucose-Capillary 136 (H) 65 - 99 mg/dL  Glucose, capillary     Status: Abnormal   Collection Time: 05/22/17  7:59 PM  Result Value Ref Range   Glucose-Capillary 128 (H) 65 - 99 mg/dL   Comment 1 Notify RN   Glucose, capillary     Status: Abnormal   Collection Time: 05/22/17 10:29 PM  Result Value Ref Range   Glucose-Capillary 116 (H) 65 - 99 mg/dL   Comment 1 Notify RN   Glucose, capillary     Status: Abnormal   Collection Time: 05/23/17 12:21 AM  Result Value Ref Range   Glucose-Capillary 122 (H) 65 - 99 mg/dL   Comment 1 Notify RN   Comprehensive metabolic panel     Status: Abnormal   Collection Time: 05/23/17  4:59 AM  Result Value Ref Range   Sodium 138 135 - 145 mmol/L   Potassium 3.6 3.5 - 5.1 mmol/L    Comment: DELTA CHECK NOTED   Chloride 103 101 - 111 mmol/L   CO2 27 22 - 32 mmol/L   Glucose, Bld 120 (H) 65 - 99 mg/dL   BUN 10 6 - 20 mg/dL   Creatinine, Ser 1.04 (H) 0.44 - 1.00 mg/dL   Calcium 8.9 8.9 - 10.3 mg/dL   Total Protein 5.9 (L) 6.5 - 8.1 g/dL   Albumin 2.8 (L) 3.5 - 5.0 g/dL   AST 42 (H) 15 - 41 U/L   ALT 43 14 - 54 U/L   Alkaline Phosphatase 65 38 - 126 U/L   Total Bilirubin 0.7 0.3 - 1.2 mg/dL   GFR calc non Af Amer 54 (L) >60 mL/min   GFR calc Af Amer >60 >60 mL/min    Comment: (NOTE) The eGFR has been calculated using the CKD EPI equation. This calculation has not been validated in all clinical situations. eGFR's persistently <60 mL/min  signify possible Chronic Kidney Disease.    Anion gap 8 5 - 15  CBC     Status: Abnormal   Collection Time: 05/23/17  4:59 AM  Result Value Ref Range   WBC 9.5 4.0 - 10.5 K/uL   RBC 4.57 3.87 - 5.11 MIL/uL   Hemoglobin 12.2 12.0 - 15.0 g/dL   HCT 37.7 36.0 - 46.0 %   MCV 82.5 78.0 - 100.0 fL   MCH 26.7 26.0 - 34.0 pg   MCHC 32.4 30.0 - 36.0 g/dL   RDW 14.8 11.5 - 15.5 %   Platelets 132 (L) 150 - 400 K/uL  Magnesium     Status: Abnormal   Collection Time: 05/23/17  4:59 AM  Result Value Ref Range   Magnesium 1.5 (L) 1.7 - 2.4 mg/dL  Phosphorus     Status: None   Collection Time: 05/23/17  4:59 AM  Result Value Ref Range   Phosphorus 3.0 2.5 - 4.6 mg/dL  Glucose, capillary     Status: Abnormal   Collection Time: 05/23/17  5:10 AM  Result Value Ref Range   Glucose-Capillary 128 (H) 65 - 99 mg/dL   Comment 1 Venous Specimen   Glucose, capillary     Status: Abnormal   Collection Time: 05/23/17  7:52 AM  Result Value Ref Range   Glucose-Capillary 122 (H) 65 - 99 mg/dL   Comment 1 Capillary Specimen   Glucose, capillary     Status: None   Collection Time: 05/23/17 12:06 PM  Result Value Ref Range   Glucose-Capillary 98 65 - 99 mg/dL   Comment 1 Capillary Specimen   Glucose, capillary     Status: None   Collection Time: 05/23/17  4:49 PM  Result Value Ref Range   Glucose-Capillary 85 65 - 99 mg/dL   Comment 1 Capillary Specimen   Glucose, capillary     Status: Abnormal   Collection Time: 05/23/17  7:40 PM  Result Value Ref Range   Glucose-Capillary  109 (H) 65 - 99 mg/dL  Glucose, capillary     Status: Abnormal   Collection Time: 05/23/17 11:13 PM  Result Value Ref Range   Glucose-Capillary 100 (H) 65 - 99 mg/dL  Glucose, capillary     Status: None   Collection Time: 05/24/17  3:07 AM  Result Value Ref Range   Glucose-Capillary 86 65 - 99 mg/dL  CBC     Status: Abnormal   Collection Time: 05/24/17  5:33 AM  Result Value Ref Range   WBC 7.1 4.0 - 10.5 K/uL   RBC 4.45  3.87 - 5.11 MIL/uL   Hemoglobin 12.1 12.0 - 15.0 g/dL   HCT 36.6 36.0 - 46.0 %   MCV 82.2 78.0 - 100.0 fL   MCH 27.2 26.0 - 34.0 pg   MCHC 33.1 30.0 - 36.0 g/dL   RDW 14.4 11.5 - 15.5 %   Platelets 142 (L) 150 - 400 K/uL  Basic metabolic panel     Status: Abnormal   Collection Time: 05/24/17  5:33 AM  Result Value Ref Range   Sodium 139 135 - 145 mmol/L   Potassium 4.4 3.5 - 5.1 mmol/L   Chloride 106 101 - 111 mmol/L   CO2 28 22 - 32 mmol/L   Glucose, Bld 78 65 - 99 mg/dL   BUN 13 6 - 20 mg/dL   Creatinine, Ser 1.08 (H) 0.44 - 1.00 mg/dL   Calcium 8.9 8.9 - 10.3 mg/dL   GFR calc non Af Amer 52 (L) >60 mL/min   GFR calc Af Amer >60 >60 mL/min    Comment: (NOTE) The eGFR has been calculated using the CKD EPI equation. This calculation has not been validated in all clinical situations. eGFR's persistently <60 mL/min signify possible Chronic Kidney Disease.    Anion gap 5 5 - 15  Magnesium     Status: None   Collection Time: 05/24/17  5:33 AM  Result Value Ref Range   Magnesium 1.9 1.7 - 2.4 mg/dL  Glucose, capillary     Status: None   Collection Time: 05/24/17  7:34 AM  Result Value Ref Range   Glucose-Capillary 84 65 - 99 mg/dL   Comment 1 Capillary Specimen     Current Facility-Administered Medications  Medication Dose Route Frequency Provider Last Rate Last Dose  . 0.9 %  sodium chloride infusion  250 mL Intravenous PRN Desai, Rahul P, PA-C      . acetaminophen (TYLENOL) tablet 650 mg  650 mg Oral Q4H PRN Leonie Man, MD   650 mg at 05/24/17 0915  . Chlorhexidine Gluconate Cloth 2 % PADS 6 each  6 each Topical Daily Chesley Mires, MD   6 each at 05/24/17 1004  . fentaNYL (SUBLIMAZE) injection 25-50 mcg  25-50 mcg Intravenous Q2H PRN Germain Osgood, PA-C   50 mcg at 05/24/17 0203  . gabapentin (NEURONTIN) capsule 300 mg  300 mg Oral TID Javier Glazier, MD   300 mg at 05/24/17 0915  . heparin injection 5,000 Units  5,000 Units Subcutaneous Q8H Leonie Man, MD    5,000 Units at 05/24/17 0532  . insulin aspart (novoLOG) injection 0-9 Units  0-9 Units Subcutaneous Q4H Javier Glazier, MD   1 Units at 05/22/17 1619  . lip balm (BLISTEX) ointment   Topical PRN Chesley Mires, MD      . ondansetron [facility]) injection 4 mg  4 mg Intravenous Q6H PRN Leonie Man, MD   4 mg at 05/21/17  2351  . potassium chloride 20 MEQ/15ML (10%) solution 40 mEq  40 mEq Oral Once Magdalen Spatz, NP      . senna (SENOKOT) tablet 8.6 mg  1 tablet Oral Daily PRN Magdalen Spatz, NP      . sodium chloride flush (NS) 0.9 % injection 10-40 mL  10-40 mL Intracatheter Q12H Chesley Mires, MD   20 mL at 05/24/17 0915  . sodium chloride flush (NS) 0.9 % injection 10-40 mL  10-40 mL Intracatheter PRN Chesley Mires, MD   30 mL at 05/24/17 1506    Musculoskeletal: Strength & Muscle Tone: within normal limits Gait & Station: UTA as patient was lying in bed and connected to multiple lines.  Patient leans: N/A  Psychiatric Specialty Exam: Physical Exam  Nursing note and vitals reviewed. Constitutional: She is oriented to person, place, and time. She appears well-developed and well-nourished.  HENT:  Head: Normocephalic and atraumatic.  Neck: Normal range of motion.  Respiratory: Effort normal.  Musculoskeletal: Normal range of motion.  Neurological: She is alert and oriented to person, place, and time.  Skin: No rash noted.    Review of Systems  Musculoskeletal: Positive for back pain (chronic back pain since work injury 15 years ago.).  Psychiatric/Behavioral: Positive for depression and suicidal ideas. Negative for hallucinations, memory loss and substance abuse. The patient is not nervous/anxious and does not have insomnia.     Blood pressure 134/61, pulse 96, temperature 98.6 F (37 C), temperature source Oral, resp. rate 18, height 5' 6"  (1.676 m), weight 75.2 kg (165 lb 12.6 oz), SpO2 98 %.Body mass index is 26.76 kg/m.  General Appearance: Well Groomed, elderly Caucasian  female with short gray hair and multiple IV lines attached.  Eye Contact:  Good  Speech:  Normal Rate  Volume:  Normal  Mood:  Depressed and anxious  Affect:  Depressed and Tearful, continues to be dysphoric  Thought Process:  Goal Directed and Linear  Orientation:  Full (Time, Place, and Person)  Thought Content:  Logical  Suicidal Thoughts:  Yes.  with intent/plan  Homicidal Thoughts:  No  Memory:  Immediate;   Good Recent;   Good Remote;   Good  Judgement:  Poor given recent overdose.  Insight:  Poor. She does not realize the seriousness of her attempt and refuses treatment.   Psychomotor Activity:  Decreased  Concentration:  Concentration: Good and Attention Span: Good  Recall:  Good  Fund of Knowledge:  Good  Language:  Good  Akathisia:  No  Handed:  Right  AIMS (if indicated):     Assets:  Financial Resources/Insurance Housing Social Support  ADL's:  Intact  Cognition:  WNL  Sleep:   Okay    Assessment: Rachel White is admitted to the hospital following intentional suicide attempt by overdose of her prescribed medications (Lorazepam and antihypertensives). She continues to endorse hopelessness and SI with depressive symptoms. She warrants inpatient psychiatric admission for stabilization and treatment given high risk of harming self.   Treatment Plan Summary: MDD, single episode, severe, without psychosis:  Based on my evaluation today patient continued to meet Criteria for inpatient psychiatric hospitalization Last unit to CSW to contact behavioral health Hospital administrative coordinator who may be able to provide bed tomorrow as they do not have any available to her today  -Recommend inpatient psychiatric hospitalization following medical clearance. -Please pursue involuntary commitment if patient refuses voluntary psychiatric hospitalization. -Please have unit SW find appropriate psychiatric bed once patient is medically cleared.  -  Continue sitter given risk of  harm to self with ongoing suicidal ideation. -Patient declines medication management for depression at this time. -Will sign off on patient at this time. Please consult psychiatry as needed.   Disposition: Recommend psychiatric Inpatient admission when medically cleared.  Ambrose Finland, MD 05/24/2017 3:21 PM

## 2017-05-24 NOTE — BH Assessment (Signed)
Patient has been accepted to [facility].  Accepting physician is Dr. Bary Leriche.  Attending Physician will be Dr. Bary Leriche.  Patient has been assigned to room 304, by [place].  Call report to [phone].  Representative/Transfer Coordinator is Dispensing optician Patient pre-admitted by [facility] Patient Access Genella Rife)   [facility] Staff Eliezer Lofts, Social Worker) made aware of acceptance.

## 2017-05-24 NOTE — Progress Notes (Signed)
PULMONARY / CRITICAL CARE MEDICINE   Name: Rachel White MRN: [id number] DOB: 06/24/1951    ADMISSION DATE:  05/19/2017 CONSULTATION DATE:  05/19/17  REFERRING MD:  Kathrynn Humble  CHIEF COMPLAINT:  AMS after calcium channel overdose  HISTORY OF PRESENT ILLNESS:   66 yo female smoker with hx DM, HTN, OSA found unresponsive 10/31 at home by family, and found to have empty pill bottles including amlodipine.  In ER she was bradycardic with heart rate of 20.  She was given calcium chloride glucagon and externally paced.  PCCM called for ICU admission.  She required temporary pacing which is now off.  On interview with psychiatry she admitted to suicide attempt and intentional overdose after the loss of a family member.   SUBJECTIVE: awake, alert, no c/o.  Ambulating in room.    VITAL SIGNS: BP 107/68 (BP Location: Left Arm)   Pulse 92   Temp 98.4 F (36.9 C)   Resp 16   Ht 5\' 6"  (1.676 m)   Wt 78.1 kg (172 lb 2.9 oz)   SpO2 97%   BMI 27.79 kg/m   INTAKE / OUTPUT: I/O last 3 completed shifts: In: 2367.9 [P.O.:960; I.V.:1357.9; IV Piggyback:50] Out: 0086 [Urine:9075]  PHYSICAL EXAMINATION:  General - alert, awake and following commands.  Relating in room Eyes - PERRLA ENT - no sinus tenderness, no oral exudate, no LAN Cardiac - S1, S2, RRR, No RMG Chest -aspirations are even and nonlabored clear to auscultation Abd - soft, non-tender, , non-distended, BS quiet Ext - no  edema, warm and dry, some L shoulder tenderness, but full ROM Skin - no rashes, lesions , warm dry and intact, abrasions L arm Neuro - normal strength, flat affect, MAE x 4  LABS:  BMET Recent Labs  Lab 05/21/17 0509 05/22/17 0511 05/23/17 0459  NA 135 133* 138  K 3.6 4.7 3.6  CL 107 109 103  CO2 22 20* 27  BUN 21* 10 10  CREATININE 1.27* 0.73 1.04*  GLUCOSE 228* 134* 120*   Electrolytes Recent Labs  Lab 05/21/17 0509 05/22/17 0511 05/23/17 0459  CALCIUM 8.1* 8.2* 8.9  MG 1.4* 2.0 1.5*   PHOS 2.7 2.1* 3.0   CBC Recent Labs  Lab 05/22/17 0511 05/23/17 0459 05/24/17 0533  WBC 13.7* 9.5 7.1  HGB 11.3* 12.2 12.1  HCT 34.7* 37.7 36.6  PLT 123* 132* 142*   Coag's No results for input(s): APTT, INR in the last 168 hours.  Sepsis Markers Recent Labs  Lab 05/19/17 1754 05/20/17 0557  LATICACIDVEN 4.00* 6.6*   ABG Recent Labs  Lab 05/19/17 1743 05/19/17 2257  PHART 7.328* 7.379  PCO2ART 34.1 30.5*  PO2ART 66.0* 71.0*   Liver Enzymes Recent Labs  Lab 05/21/17 0816 05/22/17 0511 05/23/17 0459  AST 32 48* 42*  ALT 31 38 43  ALKPHOS 40 52 65  BILITOT 0.8 0.5 0.7  ALBUMIN 2.5* 2.6* 2.8*   Cardiac Enzymes Recent Labs  Lab 05/19/17 1730 05/20/17 0135  TROPONINI <0.03 0.04*   Glucose Recent Labs  Lab 05/23/17 1206 05/23/17 1649 05/23/17 1940 05/23/17 2313 05/24/17 0307 05/24/17 0734  GLUCAP 98 85 109* 100* 86 84   Imaging Dg Chest Port 1 View  Result Date: 05/24/2017 CLINICAL DATA:  Shortness of breath, respiratory failure. EXAM: PORTABLE CHEST 1 VIEW COMPARISON:  Radiograph of May 23, 2017. FINDINGS: Stable cardiomediastinal silhouette. Atherosclerosis of thoracic aorta is noted. No pneumothorax is noted. Right-sided PICC line is unchanged in position. Right perihilar atelectasis is  noted. Stable mild bibasilar subsegmental atelectasis is noted. No significant pleural effusions are noted. Bony thorax is unremarkable. IMPRESSION: Aortic atherosclerosis. Right perihilar atelectasis is noted. Stable mild bibasilar subsegmental atelectasis. Electronically Signed   By: Marijo Conception, M.D.   On: 05/24/2017 07:57   STUDIES:  05/20/2017>> The cavity size was normal. Wall thickness was   normal. Systolic function was vigorous. EF:  65% to 70%. MV: Mild regurgitation  SIGNIFICANT EVENTS: 10/31 admit, temporary pacer inserted 11/2>> continued to require maximal pressor support>> drug half life is 50-60 hours 11/3>> Off all pressors, weaning  glucagon, lasix  LINES/TUBES: Rt femoral sheath 10/31 >> out 05/21/2017>> External pacer removed 11/2>> PICC  DISCUSSION: 66 y.o. female admitted 10/31 after intentional  overdose of calcium channel blockers, after recent death of a family member.  ASSESSMENT / PLAN:  Calcium channel Cathey overdose-suicide attempt Bradycardia Cardiogenic shock-resolved.  Off pressors.  No further pacemaker needs. Plan- Patient is medically cleared for discharge to inpatient psych No plans for further cardiac evaluation per cards   Anion gap metabolic acidosis with lactic acidosis.>> resolved Acute renal failure with ATN.>> resolving Hypokalemia, hypophosphatemia hypomagnesemia  Plan: Trend to bmet Mag pending  DM type II. Poor Appetite Plan: Monitor CBGs Encourage p.o. Intake  Pleural effusion-improved after 1 dose of Lasix Plan Pulmonary hygiene Outpatient PCP follow-up  Acute metabolic encephalopathy.>>  resolved suicide attempt with intentional overdose. Plan- Inpatient psychiatric admission per psychiatry recommendations She is medically cleared for this whenever bed is available Continue safety sitter Patient declines medication management for depression-will defer to psychiatry during inpatient psychiatric admission Social work following for psychiatric admission Psychiatry recommends involuntary commitment if patient decides to refuse voluntary hospitalization   DVT prophylaxis - SQ heparin SUP - not indicated Nutrition - Heart Healthy diet Goals of care - full code  Will transfer to telemetry and ask triad to assume care in a.m. 11/6 if patient has not discharged to inpatient psychiatry by then  Nickolas Madrid, NP 05/24/2017  9:51 AM Pager: [phone] or [phone]

## 2017-05-24 NOTE — Tx Team (Signed)
Initial Treatment Plan 05/24/2017 10:48 PM Rachel L Nahar JZP:[id number]    PATIENT STRESSORS: Health problems Loss of brother   PATIENT STRENGTHS: Active sense of humor Average or above average intelligence Capable of independent living Dispensing optician for treatment/growth Supportive family/friends   PATIENT IDENTIFIED PROBLEMS: Depression  Suicide attempt  Grief                 DISCHARGE CRITERIA:  Improved stabilization in mood, thinking, and/or behavior Medical problems require only outpatient monitoring Motivation to continue treatment in a less acute level of care Verbal commitment to aftercare and medication compliance  PRELIMINARY DISCHARGE PLAN: Outpatient therapy Participate in family therapy Return to previous living arrangement  PATIENT/FAMILY INVOLVEMENT: This treatment plan has been presented to and reviewed with the patient, Rachel White. The patient has been given the opportunity to ask questions and make suggestions.  Ronelle Nigh, RN 05/24/2017, 10:48 PM

## 2017-05-24 NOTE — Progress Notes (Signed)
Rachel White L Fray to be D/C'd [place] per MD order.  Discussed with the patient and all questions fully answered.  VSS, Skin clean, dry and intact without evidence of skin break down, no evidence of skin tears noted. IV catheter discontinued intact. Site without signs and symptoms of complications. Dressing and pressure applied.  An After Visit Summary was printed sent with the patient.   D/c education completed with patient/family including follow up instructions, medication list, d/c activities limitations if indicated, with other d/c instructions as indicated by MD - patient able to verbalize understanding, all questions fully answered.   Patient escorted via [place] transport.  Sharion Balloon 05/24/2017 7:29 PM

## 2017-05-24 NOTE — Discharge Summary (Signed)
Physician Discharge Summary  Patient ID: Rachel White MRN: [id number] DOB/AGE: 25-Nov-1950 66 y.o.  Admit date: 05/19/2017 Discharge date: 05/24/2017    Discharge Diagnoses:  Principal Problem:   MDD (major depressive disorder), single episode, severe , no psychosis ([place]) Active Problems:   Overdose of calcium-channel [place]   Overdose of cardiovascular agent   Shock circulatory ([name])   Symptomatic bradycardia   Intentional drug overdose ([place])                                                       D/c plan by Discharge Diagnosis  Calcium channel [place] overdose-suicide attempt Bradycardia-secondary to intentional amlodipine overdose Cardiogenic shock-resolved.  Off pressors.  No further pacemaker needs. Plan- Patient is medically cleared for discharge to inpatient psych No plans for further cardiac evaluation per cards Maintain off home amlodipine Can resume home lisinopril on discharge Will need outpatient PCP follow-up for hypertension management    Anion gap metabolic acidosis with lactic acidosis.>> resolved Acute renal failure with ATN.>> resolving Hypokalemia, hypophosphatemia hypomagnesemia Plan: Trend bmet   DM type II. Poor Appetite Plan: Monitor CBGs Encourage p.o. Intake   Pleural effusion-improved after 1 dose of Lasix Plan Pulmonary hygiene Outpatient PCP follow-up  Acute metabolic encephalopathy.>>  resolved suicide attempt with intentional overdose. Plan- Inpatient psychiatric admission per psychiatry recommendations She is medically cleared for this whenever bed is available Continue safety sitter Patient declines medication management for depression-will defer to psychiatry during inpatient psychiatric admission Social work following for psychiatric admission Psychiatry recommends involuntary commitment if patient decides to refuse voluntary hospitalization    Brief Hospital Summary: 66 yo female smoker with hx DM, HTN, OSA  found unresponsive 10/31 at home by family, and found to have empty pill bottles including amlodipine.  In ER she was bradycardic with heart rate of 20.  She was given calcium chloride glucagon and externally paced.  PCCM called for ICU admission.  She required temporary pacing which was removed 11/2.  She required glucagon and Levophed drips which are now off.  On interview with psychiatry she admitted to suicide attempt and intentional overdose after the loss of a family member.     Consults: Cardiology Psychiatry  STUDIES:  05/20/2017>> The cavity size was normal. Wall thickness was normal. Systolic function was vigorous. EF:  65% to 70%. MV: Mild regurgitation  SIGNIFICANT EVENTS: 10/31 admit, temporary pacer inserted 11/2>> continued to require maximal pressor support>> drug half life is 50-60 hours 11/3>> Off all pressors, weaning glucagon, lasix  LINES/TUBES: Rt femoral sheath 10/31 >> out 05/21/2017>> External pacer removed 11/2>> PICC    Vitals:   05/24/17 0847 05/24/17 0900 05/24/17 1015 05/24/17 1056  BP: 107/68   133/68  Pulse:  92  92  Resp: 16   18  Temp:      TempSrc:      SpO2:  97%  97%  Weight:   75.2 kg (165 lb 12.6 oz)   Height:         Discharge Labs  BMET Recent Labs  Lab 05/20/17 0557  05/20/17 1556 05/20/17 2009 05/21/17 0000 05/21/17 0509 05/22/17 0511 05/23/17 0459  NA 132*   < > 132* 135 135 135 133* 138  K 2.8*   < > 3.5 3.7 3.7 3.6 4.7 3.6  CL 105   < >  105 108 108 107 109 103  CO2 12*   < > 20* 21* 21* 22 20* 27  GLUCOSE 482*   < > 475* 379* 300* 228* 134* 120*  BUN 32*   < > 29* 25* 23* 21* 10 10  CREATININE 1.87*   < > 1.63* 1.54* 1.44* 1.27* 0.73 1.04*  CALCIUM 9.3   < > 8.7* 8.5* 8.2* 8.1* 8.2* 8.9  MG 1.6*  --  1.6*  --   --  1.4* 2.0 1.5*  PHOS 2.0*  --  2.4*  --   --  2.7 2.1* 3.0   < > = values in this interval not displayed.     CBC  Recent Labs  Lab 05/22/17 0511 05/23/17 0459 05/24/17 0533  HGB 11.3* 12.2  12.1  HCT 34.7* 37.7 36.6  WBC 13.7* 9.5 7.1  PLT 123* 132* 142*   Anti-Coagulation No results for input(s): INR in the last 168 hours.        Allergies as of 05/24/2017      Reactions   Sulfa Antibiotics Other (See Comments)   Pt. Does not recall the reaction    Trazodone Other (See Comments)   Visual Disturbances as well as Tingling sensation   Zolpidem Tartrate Other (See Comments)   Altered mental status       Medication List    STOP taking these medications   amLODipine 5 MG tablet Commonly known as:  NORVASC   gabapentin 800 MG tablet Commonly known as:  NEURONTIN   LORazepam 1 MG tablet Commonly known as:  ATIVAN   Melatonin 10 MG Caps   tiZANidine 2 MG tablet Commonly known as:  ZANAFLEX     TAKE these medications   atorvastatin 10 MG tablet Commonly known as:  LIPITOR Take 10 mg by mouth every evening.   bifidobacterium infantis capsule Take 1 capsule by mouth daily.   lisinopril 10 MG tablet Commonly known as:  PRINIVIL,ZESTRIL Take 10 mg by mouth every morning.   meloxicam 15 MG tablet Commonly known as:  MOBIC Take 15 mg by mouth as needed.   VIACTIV MULTI-VITAMIN Chew Chew 1 each by mouth daily.         Disposition: inpt psych   Discharged Condition: Rachel White has met maximum benefit of inpatient care and is medically stable and cleared for discharge.  Patient is pending follow up as above.      Time spent on disposition:  Greater than 35 minutes.   SignedNickolas Madrid, NP 05/24/2017  11:34 AM Pager: [phone] or [phone]

## 2017-05-24 NOTE — Progress Notes (Signed)
Patient will discharge to [facility] Anticipated discharge date: 11/5 Family notified: pt spouse at bedside Transportation by [employer]- called at [place] #: [phone]  [place] signing off.  Jorge Ny, LCSW Clinical Social Worker [phone]

## 2017-05-24 NOTE — Progress Notes (Signed)
Spoke with Rose at [employer] and updated her. Rose said they are closing her case out.

## 2017-05-25 DIAGNOSIS — F322 Major depressive disorder, single episode, severe without psychotic features: Secondary | ICD-10-CM

## 2017-05-25 MED ORDER — ARIPIPRAZOLE 2 MG PO TABS
2.0000 mg | ORAL_TABLET | Freq: Every day | ORAL | Status: DC
  Administered 2017-05-25 – 2017-05-26 (×2): 2 mg via ORAL
  Filled 2017-05-25 (×2): qty 1

## 2017-05-25 MED ORDER — GABAPENTIN 400 MG PO CAPS
800.0000 mg | ORAL_CAPSULE | Freq: Two times a day (BID) | ORAL | Status: DC
  Administered 2017-05-25 – 2017-05-28 (×7): 800 mg via ORAL
  Filled 2017-05-25 (×7): qty 2

## 2017-05-25 MED ORDER — GABAPENTIN 800 MG PO TABS
800.0000 mg | ORAL_TABLET | Freq: Two times a day (BID) | ORAL | Status: DC
  Administered 2017-05-25: 800 mg via ORAL
  Filled 2017-05-25 (×2): qty 1

## 2017-05-25 MED ORDER — FLUOXETINE HCL 20 MG PO CAPS
20.0000 mg | ORAL_CAPSULE | Freq: Every day | ORAL | Status: DC
  Administered 2017-05-25 – 2017-05-26 (×2): 20 mg via ORAL
  Filled 2017-05-25 (×2): qty 1

## 2017-05-25 NOTE — BHH Suicide Risk Assessment (Signed)
[facility] Admission Suicide Risk Assessment   Nursing information obtained from:    Demographic factors:    Current Mental Status:    Loss Factors:    Historical Factors:    Risk Reduction Factors:     Total Time spent with patient: 1 hour Principal Problem: Major depressive disorder, single episode, severe without psychosis ([place]) Diagnosis:   Patient Active Problem List   Diagnosis Date Noted  . Tobacco use disorder [F17.200] 05/24/2017  . Major depressive disorder, single episode, severe without psychosis ([place]) [F32.2] 05/24/2017  . Intentional drug overdose ([place]) [T50.902A]   . Overdose of calcium-channel [place] [T46.1X1A] 05/19/2017  . Overdose of cardiovascular agent [T46.901A] 05/19/2017  . Shock circulatory ([place]) [R57.9]   . Symptomatic bradycardia [R00.1]   . Acute blood loss anemia [D62] 11/10/2015  . Severe epistaxis [R04.0] 11/09/2015  . Epistaxis [R04.0] 11/08/2015  . Dehydration [E86.0] 10/26/2015  . Acute renal failure (ARF) ([place]) [N17.9] 10/26/2015  . Hypotension [I95.9] 10/26/2015  . Hypokalemia [E87.6] 10/26/2015  . Diabetes mellitus ([place]) [E11.9] 10/26/2015  . Sleep apnea [G47.30] 10/26/2015  . Diarrhea [R19.7] 10/26/2015  . Fracture of left tibial plateau [S82.142A] 10/19/2014  . Tibial plateau fracture [S82.143A] 10/19/2014  . Peripheral neuropathy ([place]) [G62.9] 08/01/2012  . Insomnia [G47.00] 08/01/2012  . Chronic diarrhea [K52.9] 08/01/2012   Subjective Data: Overdose.  Continued Clinical Symptoms:    The "Alcohol Use Disorders Identification Test", Guidelines for Use in Primary Care, Second Edition.  World Pharmacologist [facility]). Score between 0-7:  no or low risk or alcohol related problems. Score between 8-15:  moderate risk of alcohol related problems. Score between 16-19:  high risk of alcohol related problems. Score 20 or above:  warrants further diagnostic evaluation for alcohol dependence and treatment.   CLINICAL FACTORS:   Depression:    Impulsivity Severe   Musculoskeletal: Strength & Muscle Tone: within normal limits Gait & Station: normal Patient leans: N/A  Psychiatric Specialty Exam: Physical Exam  Nursing note and vitals reviewed. Psychiatric: Her speech is normal and behavior is normal. Thought content normal. Her affect is labile. Cognition and memory are normal. She expresses impulsivity. She exhibits a depressed mood.    Review of Systems  Skin:       Bruises and scratches on both arms  Neurological: Negative.   Psychiatric/Behavioral: Positive for depression and suicidal ideas.  All other systems reviewed and are negative.   Blood pressure 112/65, pulse 100, temperature 98.4 F (36.9 C), temperature source Oral, resp. rate 18, height 5' 2.75" (1.594 m), weight 72.6 kg (160 lb), SpO2 96 %.Body mass index is 28.57 kg/m.  General Appearance: Casual  Eye Contact:  Good  Speech:  Clear and Coherent  Volume:  Normal  Mood:  Depressed and Hopeless  Affect:  Tearful  Thought Process:  Goal Directed and Descriptions of Associations: Intact  Orientation:  Full (Time, Place, and Person)  Thought Content:  WDL  Suicidal Thoughts:  No  Homicidal Thoughts:  No  Memory:  Immediate;   Fair Recent;   Fair Remote;   Fair  Judgement:  Impaired  Insight:  Present  Psychomotor Activity:  Decreased  Concentration:  Concentration: Fair and Attention Span: Fair  Recall:  [employer] of Knowledge:  Fair  Language:  Fair  Akathisia:  No  Handed:  Right  AIMS (if indicated):     Assets:  Communication Skills Desire for Improvement Financial Resources/Insurance Housing Intimacy Physical Health Resilience Social Support Transportation  ADL's:  Intact  Cognition:  WNL  Sleep:  Number of Hours: 6.5      COGNITIVE FEATURES THAT CONTRIBUTE TO RISK:  None    SUICIDE RISK:   Moderate:  Frequent suicidal ideation with limited intensity, and duration, some specificity in terms of plans, no associated intent,  good self-control, limited dysphoria/symptomatology, some risk factors present, and identifiable protective factors, including available and accessible social support.  PLAN OF CARE: Hospital admission, medication management, discharge planning.  Rachel White is a 66 year old female with no past psychiatric history transfer from [facility] medical floor where she was hospitalized after overdose on calcium channel [place] in the context of major loss.   # Suicidal ideation, resolved -patient is able to contract for safety in the hospital  # Mood - Start Prozac 20 mg daily - Start Abilify 2 mg daily  # Neuropathy - Restart Neurontin 800 mg BID  # HTN - Continue Lisinopril 10 mg daily  # Dyslipidemia - Continue Lipitor 10 mg daily  # Back pain - Continue Mobic 15 mg daily  # Metabolic syndrome monitoring - Lipid panel, TSH and HgbA1C are pending - EKG pending  # Disposition - Discharge with family - Follow up with Hospice for grief management and a new psychiatrist  I certify that inpatient services furnished can reasonably be expected to improve the patient's condition.   Orson Slick, MD 05/25/2017, 9:56 AM

## 2017-05-25 NOTE — Progress Notes (Signed)
D: Pt denies SI/HI/AVH. Pt is pleasant and cooperative. Patient complains of restlessness and received PRN medication. Patient stated feeling relief, voiced that she felt that her medication is effective, was open about feelings and was able to verbalize understanding for education provided.   A: Q x 15 minute observation checks were completed for safety, moderate fall precausions. Patient was provided with education on medications. Patient was offered support and encouragement. Patient was given PRN medications. Patient  was encourage to attend groups, participate in unit activities and continue with plan of care.   R: Patient is complaint with medication. Patient has no complaints at this time. Patient  receptive to treatment and safety maintained on unit.

## 2017-05-25 NOTE — BHH Group Notes (Signed)
[facility] Group Notes:  (Nursing/MHT/Case Management/Adjunct)  Date:  05/25/2017  Time:  2:08 PM  Type of Therapy:  Psychoeducational Skills  Participation Level:  Did Not Attend    Drake Leach 05/25/2017, 2:08 PM
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
## 2017-05-25 NOTE — BHH Group Notes (Signed)
LCSW Group Therapy Note 05/25/2017 9:00am  Type of Therapy and Topic:  Group Therapy:  Setting Goals  Participation Level:  Active  Description of Group: In this process group, patients discussed using strengths to work toward goals and address challenges.  Patients identified two positive things about themselves and one goal they were working on.  Patients were given the opportunity to share openly and support each other's plan for self-empowerment.  The group discussed the value of gratitude and were encouraged to have a daily reflection of positive characteristics or circumstances.  Patients were encouraged to identify a plan to utilize their strengths to work on current challenges and goals.  Therapeutic Goals 1. Patient will verbalize personal strengths/positive qualities and relate how these can assist with achieving desired personal goals 2. Patients will verbalize affirmation of peers plans for personal change and goal setting 3. Patients will explore the value of gratitude and positive focus as related to successful achievement of goals 4. Patients will verbalize a plan for regular reinforcement of personal positive qualities and circumstances.  Summary of Patient Progress:  Pt able to meet therapeutic goals.  Tearful at times when talking about her new diagnosis of depression.  She shares that her goal is to "get through groups without crying"  Group discussed possibility that avoidance of crying or facing feelings that she was doing was unhealthy.   Therapeutic Modalities Cognitive Behavioral Therapy Motivational Interviewing    August Saucer, LCSW 05/25/2017 10:55 AM

## 2017-05-25 NOTE — Progress Notes (Signed)
Patient is newly admitted and adjusting well. Upon admission, patient was able to go to the dayroom and met peers. Anxious but pleasant. Had a snack and went to her room. Patient took a shower and went to bed. Requested sleeping medications. MD was contacted and Benadryl ordered. However, patient fell asleep before medication given. Currently sleeping and safety maintained.

## 2017-05-25 NOTE — Plan of Care (Signed)
Patient has been observed interacting appropriately among staff and others on the unit today. Patient has effectively verbalized feelings to this Probation officer and is working on Doctor, general practice for depression. Patient has also verbalized understanding of information provided by this Probation officer. Demonstrates understanding of medication regimen and is receptive and compliant with medications prescribed. Support and encouragement will continued to be offered. Education needs addressed and patient is receptive to care and education provided by this Probation officer. Current treatment goals and care plans for Rachel White are progressing at this time.

## 2017-05-25 NOTE — BHH Group Notes (Signed)
[facility] Group Notes:  (Nursing/MHT/Case Management/Adjunct)  Date:  05/25/2017  Time:  11:31 PM  Type of Therapy:  Group Therapy  Participation Level:  Active  Participation Quality:  Appropriate  Affect:  Appropriate  Cognitive:  Appropriate  Insight:  Good  Engagement in Group:  Engaged  Modes of Intervention:  Support  Summary of Progress/Problems:  Rachel White 05/25/2017, 11:31 PM

## 2017-05-25 NOTE — Tx Team (Signed)
Initial Treatment Plan 05/25/2017 7:32 PM Rachel White SLH:[id number]    PATIENT STRESSORS: Loss of "my brother who died in 02-24-23. I was the primary Care Giver. I don't care and I just wanted to die, I OD on Norvasc...." Medication change or noncompliance   PATIENT STRENGTHS: Ability for insight Average or above average intelligence Capable of independent living Motivation for treatment/growth Supportive family/friends   PATIENT IDENTIFIED PROBLEMS: Mood Instability  Suicidal Attempt by Calcium Channel [place]  Ineffective Coping Skills  Dysfunctional Grieving               DISCHARGE CRITERIA:  Adequate post-discharge living arrangements Improved stabilization in mood, thinking, and/or behavior Motivation to continue treatment in a less acute level of care Reduction of life-threatening or endangering symptoms to within safe limits Verbal commitment to aftercare and medication compliance  PRELIMINARY DISCHARGE PLAN: Outpatient therapy Return to previous living arrangement  PATIENT/FAMILY INVOLVEMENT: This treatment plan has been presented to and reviewed with the patient, Rachel White.  The patient have been given the opportunity to ask questions and make suggestions.  Electa Sniff, RN 05/25/2017, 7:32 PM

## 2017-05-25 NOTE — Progress Notes (Signed)
Recreation Therapy Notes  Date: 11.06.18  Time: 2:00pm  Location: Craft Room  Behavioral response: Appropriate  Intervention Topic: Anger Management  Discussion/Intervention: Group content on today was focused on anger management. The group defined anger and reasons they become angry. Individuals expressed negative way they have dealt with anger in the past. Patients stated some positive ways they could deal with anger in the future. The group described how anger can affect your health and daily plans. Individuals participated in the intervention "Score your anger" where they had a chance to answer questions about themselves and get a score of their anger.  Patient came to group and was focused on what her peers and staff had to say about the topic at hand. Individual express that when she is angry she normally exercises. She was positive with her peers and staff while attending group.  Babette Stum LRT/CTRS        Luane Rochon 05/25/2017 4:16 PM

## 2017-05-25 NOTE — Progress Notes (Signed)
Recreation Therapy Notes  Date: 11.06.18  Time: 3:00pm  Location: Craft room  Behavioral response: Appropriate  Group Type: Art/Craft  Participation level: Active  Communication: Patient was social with peers and staff.  Comments: Patient left group for unknown reason and never returned.  Hajer Dwyer LRT/CTRS        Turon Kilmer 05/25/2017 4:32 PM

## 2017-05-25 NOTE — Progress Notes (Signed)
D: Mood/affect: anxious, pleasant and cooperative upon approach. Patient is cheerful when interacting with this Probation officer. Verbalizes that she is hopeful that treatment here will help her with her psychosocial needs. Has no concerns or complaints at this time. A: Support and encouragement offered. Encouraged to come to staff if thoughts of harm toward self and others arise. Patient agrees. R: Receptive. Checks and monitoring continued to maintain patient safety on the unit at this time.

## 2017-05-25 NOTE — Plan of Care (Signed)
Patient is newly admitted. To be evaluated in AM

## 2017-05-25 NOTE — H&P (Signed)
Psychiatric Admission Assessment Adult  Patient Identification: Rachel White MRN:  [id number] Date of Evaluation:  05/25/2017 Chief Complaint:  Depression Principal Diagnosis: Major depressive disorder, single episode, severe without psychosis ([place]) Diagnosis:   Patient Active Problem List   Diagnosis Date Noted  . Tobacco use disorder [F17.200] 05/24/2017  . Major depressive disorder, single episode, severe without psychosis ([place]) [F32.2] 05/24/2017  . Intentional drug overdose ([place]) [T50.902A]   . Overdose of calcium-channel [place] [T46.1X1A] 05/19/2017  . Overdose of cardiovascular agent [T46.901A] 05/19/2017  . Shock circulatory ([place]) [R57.9]   . Symptomatic bradycardia [R00.1]   . Acute blood loss anemia [D62] 11/10/2015  . Severe epistaxis [R04.0] 11/09/2015  . Epistaxis [R04.0] 11/08/2015  . Dehydration [E86.0] 10/26/2015  . Acute renal failure (ARF) ([place]) [N17.9] 10/26/2015  . Hypotension [I95.9] 10/26/2015  . Hypokalemia [E87.6] 10/26/2015  . Diabetes mellitus ([place]) [E11.9] 10/26/2015  . Sleep apnea [G47.30] 10/26/2015  . Diarrhea [R19.7] 10/26/2015  . Fracture of left tibial plateau [S82.142A] 10/19/2014  . Tibial plateau fracture [S82.143A] 10/19/2014  . Peripheral neuropathy ([place]) [G62.9] 08/01/2012  . Insomnia [G47.00] 08/01/2012  . Chronic diarrhea [K52.9] 08/01/2012   History of Present Illness:   Identifying data. Rachel White is a 66 year old female with no past psychiatric history.  Chief complaints. "I did not care."  History of present illness. Information was obtained from the patient and the chart. The patient was transferred to [facility] from [place] floor where she was hospitalized after serious overdose on calcium channel [place] requiring ICU admission. She had been taking care of her brother for almost two years who suffered liver failure. He died in 02/06/2023 and the patient became severely depressed but did not seek any help. Last Wednesday,  she took overdose of medications.  She was overwhelmed with grief, did not care and wanted to die. Her husband, who came home from work early that day found her unconscious. She apparently fell down the stairs as well. She reports many symptoms of depression but sleep well and gain about 10 lbs since she stopped smoking 3 months ago. She feels guilty but mostly sad. Her energy and concentration are low, she isolates herself, cries all the time, has no pleasure in life even with her grandchildren. She denies psychotic symptoms or anxiety. Sometimes she cleans too much. There are no substances involved.   During the interview, the patient looks really sad and cries all the time. She is glad to be alive and no longer is suicidal after talking to her family.  Past psychiatric history. None.  Family psychiatric history. None.  Social history. She lives with her husband and one of her four children. Close knit family. 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  Total Time spent with patient: 1 hour  Is the patient at risk to self? Yes.    Has the patient been a risk to self in the past 6 months? No.  Has the patient been a risk to self within the distant past? No.  Is the patient a risk to others? No.  Has the patient been a risk to others in the past 6 months? No.  Has the patient been a risk to others within the distant past? No.   Prior Inpatient Therapy:   Prior Outpatient Therapy:    Alcohol Screening: Patient refused Alcohol Screening Tool: Yes 1. How often do you have a drink containing alcohol?: Never 2. How many drinks containing alcohol do you have on  a typical day when you are drinking?: 1 or 2 3. How often do you have six or more drinks on one occasion?: Never AUDIT-C Score: 0 Substance Abuse History in the last 12 months:  No. Consequences of Substance Abuse: NA Previous Psychotropic Medications: No  Psychological Evaluations: No  Past Medical History:  Past  Medical History:  Diagnosis Date  . Complication of anesthesia    some difficulty waking up after tubes tied  . Diabetes mellitus without complication ([name])    diet controlled  . Headache    migraines years ago  . Hypertension   . Irritable bowel syndrome    under control  . Neuropathic pain   . Pneumonia    as a child  . Sleep apnea    does not use C_pap    Past Surgical History:  Procedure Laterality Date  . DILATION AND CURETTAGE OF UTERUS    . FOOT SURGERY Left   . GASTRIC BYPASS    . TUBAL LIGATION     Family History:  Family History  Problem Relation Age of Onset  . Cancer Mother   . Cancer Other   . COPD Other   . Diabetes type II Neg Hx     Tobacco Screening: Have you used any form of tobacco in the last 30 days? (Cigarettes, Smokeless Tobacco, Cigars, and/or Pipes): No Social History:  Social History   Substance and Sexual Activity  Alcohol Use No  . Alcohol/week: 0.0 oz     Social History   Substance and Sexual Activity  Drug Use No    Additional Social History:                           Allergies:   Allergies  Allergen Reactions  . Sulfa Antibiotics Other (See Comments)    Pt. Does not recall the reaction   . Trazodone Other (See Comments)    Visual Disturbances as well as Tingling sensation  . Zolpidem Tartrate Other (See Comments)    Altered mental status    Lab Results:  Results for orders placed or performed during the hospital encounter of 05/19/17 (from the past 48 hour(s))  Glucose, capillary     Status: None   Collection Time: 05/23/17 12:06 PM  Result Value Ref Range   Glucose-Capillary 98 65 - 99 mg/dL   Comment 1 Capillary Specimen   Glucose, capillary     Status: None   Collection Time: 05/23/17  4:49 PM  Result Value Ref Range   Glucose-Capillary 85 65 - 99 mg/dL   Comment 1 Capillary Specimen   Glucose, capillary     Status: Abnormal   Collection Time: 05/23/17  7:40 PM  Result Value Ref Range    Glucose-Capillary 109 (H) 65 - 99 mg/dL  Glucose, capillary     Status: Abnormal   Collection Time: 05/23/17 11:13 PM  Result Value Ref Range   Glucose-Capillary 100 (H) 65 - 99 mg/dL  Glucose, capillary     Status: None   Collection Time: 05/24/17  3:07 AM  Result Value Ref Range   Glucose-Capillary 86 65 - 99 mg/dL  CBC     Status: Abnormal   Collection Time: 05/24/17  5:33 AM  Result Value Ref Range   WBC 7.1 4.0 - 10.5 K/uL   RBC 4.45 3.87 - 5.11 MIL/uL   Hemoglobin 12.1 12.0 - 15.0 g/dL   HCT 36.6 36.0 - 46.0 %   MCV 82.2  78.0 - 100.0 fL   MCH 27.2 26.0 - 34.0 pg   MCHC 33.1 30.0 - 36.0 g/dL   RDW 14.4 11.5 - 15.5 %   Platelets 142 (L) 150 - 400 K/uL  Basic metabolic panel     Status: Abnormal   Collection Time: 05/24/17  5:33 AM  Result Value Ref Range   Sodium 139 135 - 145 mmol/L   Potassium 4.4 3.5 - 5.1 mmol/L   Chloride 106 101 - 111 mmol/L   CO2 28 22 - 32 mmol/L   Glucose, Bld 78 65 - 99 mg/dL   BUN 13 6 - 20 mg/dL   Creatinine, Ser 1.08 (H) 0.44 - 1.00 mg/dL   Calcium 8.9 8.9 - 10.3 mg/dL   GFR calc non Af Amer 52 (L) >60 mL/min   GFR calc Af Amer >60 >60 mL/min    Comment: (NOTE) The eGFR has been calculated using the CKD EPI equation. This calculation has not been validated in all clinical situations. eGFR's persistently <60 mL/min signify possible Chronic Kidney Disease.    Anion gap 5 5 - 15  Magnesium     Status: None   Collection Time: 05/24/17  5:33 AM  Result Value Ref Range   Magnesium 1.9 1.7 - 2.4 mg/dL  Glucose, capillary     Status: None   Collection Time: 05/24/17  7:34 AM  Result Value Ref Range   Glucose-Capillary 84 65 - 99 mg/dL   Comment 1 Capillary Specimen   Glucose, capillary     Status: None   Collection Time: 05/24/17  4:39 PM  Result Value Ref Range   Glucose-Capillary 96 65 - 99 mg/dL    Blood Alcohol level:  Lab Results  Component Value Date   ETH <10 16/04/9603    Metabolic Disorder Labs:  Lab Results  Component  Value Date   HGBA1C 5.7 (H) 10/27/2015   MPG 117 10/27/2015   No results found for: PROLACTIN Lab Results  Component Value Date   CHOL 195 08/01/2012   TRIG 126 08/01/2012   HDL 34 (L) 08/01/2012   CHOLHDL 5.7 08/01/2012   VLDL 25 08/01/2012   LDLCALC 136 (H) 08/01/2012    Current Medications: Current Facility-Administered Medications  Medication Dose Route Frequency Provider Last Rate Last Dose  . acetaminophen (TYLENOL) tablet 650 mg  650 mg Oral Q6H PRN Lyrika Souders B, MD      . alum & mag hydroxide-simeth (MAALOX/MYLANTA) 200-200-20 MG/5ML suspension 30 mL  30 mL Oral Q4H PRN Halana Deisher B, MD      . atorvastatin (LIPITOR) tablet 10 mg  10 mg Oral q1800 Donivan Thammavong B, MD      . diphenhydrAMINE (BENADRYL) capsule 50 mg  50 mg Oral QHS PRN Clapacs, John T, MD      . gabapentin (NEURONTIN) tablet 800 mg  800 mg Oral BID Vauda Salvucci B, MD   800 mg at 05/25/17 1006  . lisinopril (PRINIVIL,ZESTRIL) tablet 10 mg  10 mg Oral Daily Carlyon Nolasco B, MD   10 mg at 05/25/17 0806  . magnesium hydroxide (MILK OF MAGNESIA) suspension 30 mL  30 mL Oral Daily PRN Santos Sollenberger B, MD      . meloxicam (MOBIC) tablet 15 mg  15 mg Oral Daily Ledarrius Beauchaine B, MD   15 mg at 05/25/17 0806   PTA Medications: Medications Prior to Admission  Medication Sig Dispense Refill Last Dose  . atorvastatin (LIPITOR) 10 MG tablet Take 10 mg by mouth  every evening.   Taking  . bifidobacterium infantis (ALIGN) capsule Take 1 capsule by mouth daily.     Marland Kitchen lisinopril (PRINIVIL,ZESTRIL) 10 MG tablet Take 10 mg by mouth every morning.   Taking  . meloxicam (MOBIC) 15 MG tablet Take 15 mg by mouth as needed.     . Multiple Vitamins-Calcium (VIACTIV MULTI-VITAMIN) CHEW Chew 1 each by mouth daily.   Taking    Musculoskeletal: Strength & Muscle Tone: within normal limits Gait & Station: normal Patient leans: N/A  Psychiatric Specialty Exam: I reviewed physical exam  performed on medical floor and agree with the findings. Physical Exam  Nursing note and vitals reviewed. Psychiatric: Her speech is normal and behavior is normal. Thought content normal. Her affect is labile. Cognition and memory are normal. She expresses impulsivity. She exhibits a depressed mood.    Review of Systems  Musculoskeletal: Positive for back pain.  Neurological: Positive for tingling.  Psychiatric/Behavioral: Positive for depression and suicidal ideas.  All other systems reviewed and are negative.   Blood pressure 112/65, pulse 100, temperature 98.4 F (36.9 C), temperature source Oral, resp. rate 18, height 5' 2.75" (1.594 m), weight 72.6 kg (160 lb), SpO2 96 %.Body mass index is 28.57 kg/m.  See SRA                                                  Sleep:  Number of Hours: 6.5    Treatment Plan Summary: Daily contact with patient to assess and evaluate symptoms and progress in treatment and Medication management   Rachel White is a 66 year old female with no past psychiatric history transfer from [facility] medical floor where she was hospitalized after overdose on calcium channel [place] in the context of major loss.   # Suicidal ideation, resolved -patient is able to contract for safety in the hospital  # Mood - Start Prozac 20 mg daily - Start Abilify 2 mg daily  # Neuropathy - Restart Neurontin 800 mg BID  # HTN - Continue Lisinopril 10 mg daily  # Dyslipidemia - Continue Lipitor 10 mg daily  # Back pain - Continue Mobic 15 mg daily  # Metabolic syndrome monitoring - Lipid panel, TSH and HgbA1C are pending - EKG pending  # Disposition - Discharge with family - Follow up with Hospice for grief counseling and a new psychiatrist   Observation Level/Precautions:  15 minute checks  Laboratory:  CBC Chemistry Profile UDS UA  Psychotherapy:    Medications:    Consultations:    Discharge Concerns:    Estimated LOS:  Other:      Physician Treatment Plan for Primary Diagnosis: Major depressive disorder, single episode, severe without psychosis ([place]) Long Term Goal(s): Improvement in symptoms so as ready for discharge  Short Term Goals: Ability to identify changes in lifestyle to reduce recurrence of condition will improve, Ability to verbalize feelings will improve, Ability to disclose and discuss suicidal ideas, Ability to demonstrate self-control will improve, Ability to identify and develop effective coping behaviors will improve, Ability to maintain clinical measurements within normal limits will improve and Ability to identify triggers associated with substance abuse/mental health issues will improve  Physician Treatment Plan for Secondary Diagnosis: Principal Problem:   Major depressive disorder, single episode, severe without psychosis ([place]) Active Problems:   Chronic diarrhea   Sleep apnea   Overdose  of calcium-channel [place]   Overdose of cardiovascular agent   Intentional drug overdose ([place])   Tobacco use disorder  Long Term Goal(s): NA  Short Term Goals: NA  I certify that inpatient services furnished can reasonably be expected to improve the patient's condition.    Orson Slick, MD 11/6/201810:09 AM

## 2017-05-26 LAB — LIPID PANEL
CHOL/HDL RATIO: 6.2 ratio
CHOLESTEROL: 154 mg/dL (ref 0–200)
HDL: 25 mg/dL — AB (ref >40)
LDL Cholesterol: 84 mg/dL (ref 0–99)
Triglycerides: 225 mg/dL — ABNORMAL HIGH (ref <150)
VLDL: 45 mg/dL — ABNORMAL HIGH (ref 0–40)

## 2017-05-26 LAB — HEMOGLOBIN A1C
HEMOGLOBIN A1C: 6 % — AB (ref 4.8–5.6)
Mean Plasma Glucose: 125.5 mg/dL

## 2017-05-26 LAB — TSH: TSH: 2.771 u[IU]/mL (ref 0.350–4.500)

## 2017-05-26 MED ORDER — TEMAZEPAM 15 MG PO CAPS
15.0000 mg | ORAL_CAPSULE | Freq: Every day | ORAL | Status: DC
  Administered 2017-05-26 – 2017-05-27 (×2): 15 mg via ORAL
  Filled 2017-05-26 (×2): qty 1

## 2017-05-26 MED ORDER — ARIPIPRAZOLE 10 MG PO TABS
10.0000 mg | ORAL_TABLET | Freq: Every day | ORAL | Status: DC
Start: 2017-05-27 — End: 2017-05-28
  Administered 2017-05-27 – 2017-05-28 (×2): 10 mg via ORAL
  Filled 2017-05-26 (×2): qty 1

## 2017-05-26 MED ORDER — ARIPIPRAZOLE 5 MG PO TABS
5.0000 mg | ORAL_TABLET | Freq: Once | ORAL | Status: AC
  Administered 2017-05-26: 5 mg via ORAL
  Filled 2017-05-26: qty 1

## 2017-05-26 NOTE — Plan of Care (Signed)
Patient denies si, hi,a vh. She feels embarrassed she is even here. She states that being here has put things in perspective. She is cooperative  and pleasant.

## 2017-05-26 NOTE — Progress Notes (Signed)
Chaplain responded to a consult for this pt. CH met pt in the consult RM. Pt stated she doesn't remember what happened to her when got hospitalized. Pt states she has been grieving the loss of her brother who died on 2023-02-12, and that she has autistic son aged 67 who lives with her. Pt mentioned that she has a great support system but she has not utilized it. Pt was reflective and regretful as she spoke to [facility]. Also, she indicated that the medical personnel told her what she said to them when she first arrived in hospital. Pt had told the medical personel that she attempted suicide by taking pills. Pt is emotional stable today, she said. Pt denies having suicidal thoughts right now. She has a post hospital plan that she reviewed with [facility]. Pt appears to be grieving, down, and might benefit from focusing on self-care.    05/26/17 1200  Clinical Encounter Type  Visited With Patient;Health care provider  Visit Type Initial;Psychological support  Referral From Nurse  Consult/Referral To Chaplain  Spiritual Encounters  Spiritual Needs Other (Comment)

## 2017-05-26 NOTE — Progress Notes (Addendum)
Recreation Therapy Notes   Date: 11.07.18  Time: 3:00pm  Location: Outside  Behavioral response: N/A  Group Type: Leisure  Participation level: N/A  Communication: Patient did not attend group.   Comments: N/A  Makia Bossi LRT/CTRS          Lovelee Forner 05/26/2017 4:03 PM

## 2017-05-26 NOTE — Progress Notes (Signed)
[facility] MD Progress Note  05/26/2017 2:18 PM Rachel White  MRN:  [id number] Subjective:   Rachel White is unusually perky today. She feels "great" is very talkative and not at all tearful. Her behavior may be suggestive of mixed bipolar episode.  Treatment plan. We started Prozac and Abilify for depression. I will stop Prozac and increase Abilify dose to address mood instability.  Social/disposition. She will be discharged with family, needs appointmant with a new psychiatrist.  Principal Problem: Major depressive disorder, single episode, severe without psychosis ([place]) Diagnosis:   Patient Active Problem List   Diagnosis Date Noted  . Tobacco use disorder [F17.200] 05/24/2017  . Major depressive disorder, single episode, severe without psychosis ([place]) [F32.2] 05/24/2017  . Intentional drug overdose ([place]) [T50.902A]   . Overdose of calcium-channel [place] [T46.1X1A] 05/19/2017  . Overdose of cardiovascular agent [T46.901A] 05/19/2017  . Shock circulatory ([place]) [R57.9]   . Symptomatic bradycardia [R00.1]   . Acute blood loss anemia [D62] 11/10/2015  . Severe epistaxis [R04.0] 11/09/2015  . Epistaxis [R04.0] 11/08/2015  . Dehydration [E86.0] 10/26/2015  . Acute renal failure (ARF) ([place]) [N17.9] 10/26/2015  . Hypotension [I95.9] 10/26/2015  . Hypokalemia [E87.6] 10/26/2015  . Diabetes mellitus ([place]) [E11.9] 10/26/2015  . Sleep apnea [G47.30] 10/26/2015  . Diarrhea [R19.7] 10/26/2015  . Fracture of left tibial plateau [S82.142A] 10/19/2014  . Tibial plateau fracture [S82.143A] 10/19/2014  . Peripheral neuropathy ([place]) [G62.9] 08/01/2012  . Insomnia [G47.00] 08/01/2012  . Chronic diarrhea [K52.9] 08/01/2012   Total Time spent with patient: 20 minutes  Past Psychiatric History: None.  Past Medical History:  Past Medical History:  Diagnosis Date  . Complication of anesthesia    some difficulty waking up after tubes tied  . Diabetes mellitus without complication ([name])    diet  controlled  . Headache    migraines years ago  . Hypertension   . Irritable bowel syndrome    under control  . Neuropathic pain   . Pneumonia    as a child  . Sleep apnea    does not use C_pap    Past Surgical History:  Procedure Laterality Date  . DILATION AND CURETTAGE OF UTERUS    . FOOT SURGERY Left   . GASTRIC BYPASS    . TUBAL LIGATION     Family History:  Family History  Problem Relation Age of Onset  . Cancer Mother   . Cancer Other   . COPD Other   . Diabetes type II Neg Hx    Family Psychiatric  History: None. Social History:  Social History   Substance and Sexual Activity  Alcohol Use No  . Alcohol/week: 0.0 oz     Social History   Substance and Sexual Activity  Drug Use No    Social History   Socioeconomic History  . Marital status: Married    Spouse name: None  . Number of children: None  . Years of education: None  . Highest education level: None  Social Needs  . Financial resource strain: None  . Food insecurity - worry: None  . Food insecurity - inability: None  . Transportation needs - medical: None  . Transportation needs - non-medical: None  Occupational History  . None  Tobacco Use  . Smoking status: Current Every Day Smoker    Packs/day: 1.00    Years: 41.00    Pack years: 41.00    Types: Cigarettes  . Smokeless tobacco: Never Used  . Tobacco comment: quit 3 months ago  Substance and Sexual Activity  . Alcohol use: No    Alcohol/week: 0.0 oz  . Drug use: No  . Sexual activity: No    Birth control/protection: None  Other Topics Concern  . None  Social History Narrative  . None   Additional Social History:                         Sleep: Fair  Appetite:  Fair  Current Medications: Current Facility-Administered Medications  Medication Dose Route Frequency Provider Last Rate Last Dose  . acetaminophen (TYLENOL) tablet 650 mg  650 mg Oral Q6H PRN Daelon Dunivan B, MD      . alum & mag hydroxide-simeth  (MAALOX/MYLANTA) 200-200-20 MG/5ML suspension 30 mL  30 mL Oral Q4H PRN Neha Waight B, MD      . ARIPiprazole (ABILIFY) tablet 2 mg  2 mg Oral Daily Marky Buresh B, MD   2 mg at 05/26/17 0902  . atorvastatin (LIPITOR) tablet 10 mg  10 mg Oral q1800 Jaelee Laughter B, MD   10 mg at 05/25/17 1746  . diphenhydrAMINE (BENADRYL) capsule 50 mg  50 mg Oral QHS PRN Clapacs, Madie Reno, MD   50 mg at 05/25/17 2239  . FLUoxetine (PROZAC) capsule 20 mg  20 mg Oral Daily Overton Boggus B, MD   20 mg at 05/26/17 0902  . gabapentin (NEURONTIN) capsule 800 mg  800 mg Oral BID Garry Bochicchio B, MD   800 mg at 05/26/17 0903  . lisinopril (PRINIVIL,ZESTRIL) tablet 10 mg  10 mg Oral Daily Brynn Mulgrew B, MD   10 mg at 05/26/17 0903  . magnesium hydroxide (MILK OF MAGNESIA) suspension 30 mL  30 mL Oral Daily PRN Malcolm Quast B, MD      . meloxicam (MOBIC) tablet 15 mg  15 mg Oral Daily Christl Fessenden B, MD   15 mg at 05/26/17 0902    Lab Results:  Results for orders placed or performed during the hospital encounter of 05/24/17 (from the past 48 hour(s))  Lipid panel     Status: Abnormal   Collection Time: 05/26/17  7:05 AM  Result Value Ref Range   Cholesterol 154 0 - 200 mg/dL   Triglycerides 225 (H) <150 mg/dL   HDL 25 (L) >40 mg/dL   Total CHOL/HDL Ratio 6.2 RATIO   VLDL 45 (H) 0 - 40 mg/dL   LDL Cholesterol 84 0 - 99 mg/dL    Comment:        Total Cholesterol/HDL:CHD Risk Coronary Heart Disease Risk Table                     Men   Women  1/2 Average Risk   3.4   3.3  Average Risk       5.0   4.4  2 X Average Risk   9.6   7.1  3 X Average Risk  23.4   11.0        Use the calculated Patient Ratio above and the CHD Risk Table to determine the patient's CHD Risk.        ATP III CLASSIFICATION (LDL):  <100     mg/dL   Optimal  100-129  mg/dL   Near or Above                    Optimal  130-159  mg/dL   Borderline  160-189  mg/dL   High  >190  mg/dL    Very High   TSH     Status: None   Collection Time: 05/26/17  7:05 AM  Result Value Ref Range   TSH 2.771 0.350 - 4.500 uIU/mL    Comment: Performed by a 3rd Generation assay with a functional sensitivity of <=0.01 uIU/mL.  Hemoglobin A1c     Status: Abnormal   Collection Time: 05/26/17  7:05 AM  Result Value Ref Range   Hgb A1c MFr Bld 6.0 (H) 4.8 - 5.6 %    Comment: (NOTE) Pre diabetes:          5.7%-6.4% Diabetes:              >6.4% Glycemic control for   <7.0% adults with diabetes    Mean Plasma Glucose 125.5 mg/dL    Comment: Performed at [facility] [address], [place], [place] [address]    Blood Alcohol level:  Lab Results  Component Value Date   ETH <10 47/03/6282    Metabolic Disorder Labs: Lab Results  Component Value Date   HGBA1C 6.0 (H) 05/26/2017   MPG 125.5 05/26/2017   MPG 117 10/27/2015   No results found for: PROLACTIN Lab Results  Component Value Date   CHOL 154 05/26/2017   TRIG 225 (H) 05/26/2017   HDL 25 (L) 05/26/2017   CHOLHDL 6.2 05/26/2017   VLDL 45 (H) 05/26/2017   LDLCALC 84 05/26/2017   LDLCALC 136 (H) 08/01/2012    Physical Findings: AIMS:  , ,  ,  ,    CIWA:    COWS:     Musculoskeletal: Strength & Muscle Tone: within normal limits Gait & Station: normal Patient leans: N/A  Psychiatric Specialty Exam: Physical Exam  Nursing note and vitals reviewed. Psychiatric: Her speech is normal and behavior is normal. Thought content normal. Cognition and memory are normal. She expresses impulsivity. She exhibits a depressed mood.    Review of Systems  Musculoskeletal: Positive for back pain.  Neurological: Positive for tingling.  Psychiatric/Behavioral: Positive for depression.  All other systems reviewed and are negative.   Blood pressure 117/66, pulse 84, temperature 98.6 F (37 C), temperature source Oral, resp. rate 18, height 5' 2.75" (1.594 m), weight 72.6 kg (160 lb), SpO2 96 %.Body mass index is 28.57 kg/m.   General Appearance: Casual  Eye Contact:  Good  Speech:  Clear and Coherent  Volume:  Increased  Mood:  Euphoric  Affect:  Inappropriate  Thought Process:  Goal Directed and Descriptions of Associations: Intact  Orientation:  Full (Time, Place, and Person)  Thought Content:  WDL  Suicidal Thoughts:  No  Homicidal Thoughts:  No  Memory:  Immediate;   Fair Recent;   Fair Remote;   Fair  Judgement:  Poor  Insight:  Lacking  Psychomotor Activity:  Increased  Concentration:  Concentration: Fair and Attention Span: Fair  Recall:  [employer] of Knowledge:  Fair  Language:  Fair  Akathisia:  No  Handed:  Right  AIMS (if indicated):     Assets:  Communication Skills Desire for Improvement Financial Resources/Insurance Housing Physical Health Resilience Social Support  ADL's:  Intact  Cognition:  WNL  Sleep:  Number of Hours: 6.5     Treatment Plan Summary: Daily contact with patient to assess and evaluate symptoms and progress in treatment and Medication management   Rachel White is a 66 year old female with no past psychiatric history transfer from [place] floor where she was hospitalized after overdose  on calcium channel [place] in the context of major loss.   # Suicidal ideation, resolved -patient is able to contract for safety in the hospital  # Mood - Discontinue Prozac  - Increase Abilify to 10 mg daily   # Neuropathy - Continue Neurontin 800 mg BID  # HTN - Continue Lisinopril 10 mg daily  # Dyslipidemia - Continue Lipitor 10 mg daily  # Back pain - Continue Mobic 15 mg daily  # Metabolic syndrome monitoring - Lipid panel shows elevated TG, normal TSH and HgbA1C of 6.0. - EKG, QTc 418  # Disposition - Discharge with family - Follow up with Hospice for grief counseling and a new psychiatrist     Orson Slick, MD 05/26/2017, 2:18 PM

## 2017-05-26 NOTE — Tx Team (Signed)
Interdisciplinary Treatment and Diagnostic Plan Update  05/26/2017 Time of Session: Rachel White MRN: [id number]  Principal Diagnosis: Major depressive disorder, single episode, severe without psychosis (Rachel White)  Secondary Diagnoses: Principal Problem:   Major depressive disorder, single episode, severe without psychosis (Rachel White) Active Problems:   Chronic diarrhea   Sleep apnea   Overdose of calcium-channel Rachel White   Overdose of cardiovascular agent   Intentional drug overdose (Rachel White)   Tobacco use disorder   Current Medications:  Current Facility-Administered Medications  Medication Dose Route Frequency Provider Last Rate Last Dose  . acetaminophen (TYLENOL) tablet 650 mg  650 mg Oral Q6H PRN Pucilowska, Jolanta B, MD      . alum & mag hydroxide-simeth (MAALOX/MYLANTA) 200-200-20 MG/5ML suspension 30 mL  30 mL Oral Q4H PRN Pucilowska, Jolanta B, MD      . Derrill Memo ON 05/27/2017] ARIPiprazole (ABILIFY) tablet 10 mg  10 mg Oral Daily Pucilowska, Jolanta B, MD      . ARIPiprazole (ABILIFY) tablet 5 mg  5 mg Oral Once Pucilowska, Jolanta B, MD      . atorvastatin (LIPITOR) tablet 10 mg  10 mg Oral q1800 Pucilowska, Jolanta B, MD   10 mg at 05/25/17 1746  . diphenhydrAMINE (BENADRYL) capsule 50 mg  50 mg Oral QHS PRN Clapacs, Madie Reno, MD   50 mg at 05/25/17 2239  . gabapentin (NEURONTIN) capsule 800 mg  800 mg Oral BID Pucilowska, Jolanta B, MD   800 mg at 05/26/17 0903  . lisinopril (PRINIVIL,ZESTRIL) tablet 10 mg  10 mg Oral Daily Pucilowska, Jolanta B, MD   10 mg at 05/26/17 0903  . magnesium hydroxide (MILK OF MAGNESIA) suspension 30 mL  30 mL Oral Daily PRN Pucilowska, Jolanta B, MD      . meloxicam (MOBIC) tablet 15 mg  15 mg Oral Daily Pucilowska, Jolanta B, MD   15 mg at 05/26/17 0902  . temazepam (RESTORIL) capsule 15 mg  15 mg Oral QHS Pucilowska, Jolanta B, MD       PTA Medications: Medications Prior to Admission  Medication Sig Dispense Refill Last Dose  . atorvastatin  (LIPITOR) 10 MG tablet Take 10 mg by mouth every evening.   Taking  . bifidobacterium infantis (ALIGN) capsule Take 1 capsule by mouth daily.     Marland Kitchen lisinopril (PRINIVIL,ZESTRIL) 10 MG tablet Take 10 mg by mouth every morning.   Taking  . meloxicam (MOBIC) 15 MG tablet Take 15 mg by mouth as needed.     . Multiple Vitamins-Calcium (VIACTIV MULTI-VITAMIN) CHEW Chew 1 each by mouth daily.   Taking    Patient Stressors: Loss of "my brother who died in 2023/03/02. I was the primary Care Giver. I don't care and I just wanted to die, I OD on Norvasc...." Medication change or noncompliance  Patient Strengths: Ability for insight Average or above average intelligence Capable of independent living Motivation for treatment/growth Supportive family/friends  Treatment Modalities: Medication Management, Group therapy, Case management,  1 to 1 session with clinician, Psychoeducation, Recreational therapy.   Physician Treatment Plan for Primary Diagnosis: Major depressive disorder, single episode, severe without psychosis (Rachel White) Long Term Goal(s): Improvement in symptoms so as ready for discharge NA   Short Term Goals: Ability to identify changes in lifestyle to reduce recurrence of condition will improve Ability to verbalize feelings will improve Ability to disclose and discuss suicidal ideas Ability to demonstrate self-control will improve Ability to identify and develop effective coping behaviors will improve Ability to maintain clinical measurements  within normal limits will improve Ability to identify triggers associated with substance abuse/mental health issues will improve NA  Medication Management: Evaluate patient's response, side effects, and tolerance of medication regimen.  Therapeutic Interventions: 1 to 1 sessions, Unit Group sessions and Medication administration.  Evaluation of Outcomes: Progressing  Physician Treatment Plan for Secondary Diagnosis: Principal Problem:   Major  depressive disorder, single episode, severe without psychosis ([name]) Active Problems:   Chronic diarrhea   Sleep apnea   Overdose of calcium-channel Rachel White   Overdose of cardiovascular agent   Intentional drug overdose (Rachel White)   Tobacco use disorder  Long Term Goal(s): Improvement in symptoms so as ready for discharge NA   Short Term Goals: Ability to identify changes in lifestyle to reduce recurrence of condition will improve Ability to verbalize feelings will improve Ability to disclose and discuss suicidal ideas Ability to demonstrate self-control will improve Ability to identify and develop effective coping behaviors will improve Ability to maintain clinical measurements within normal limits will improve Ability to identify triggers associated with substance abuse/mental health issues will improve NA     Medication Management: Evaluate patient's response, side effects, and tolerance of medication regimen.  Therapeutic Interventions: 1 to 1 sessions, Unit Group sessions and Medication administration.  Evaluation of Outcomes: Progressing   RN Treatment Plan for Primary Diagnosis: Major depressive disorder, single episode, severe without psychosis (Rachel White) Long Term Goal(s): Knowledge of disease and therapeutic regimen to maintain health will improve  Short Term Goals: Ability to verbalize feelings will improve, Ability to disclose and discuss suicidal ideas, Ability to identify and develop effective coping behaviors will improve and Compliance with prescribed medications will improve  Medication Management: RN will administer medications as ordered by provider, will assess and evaluate patient's response and provide education to patient for prescribed medication. RN will report any adverse and/or side effects to prescribing provider.  Therapeutic Interventions: 1 on 1 counseling sessions, Psychoeducation, Medication administration, Evaluate responses to treatment, Monitor vital signs  and CBGs as ordered, Perform/monitor CIWA, COWS, AIMS and Fall Risk screenings as ordered, Perform wound care treatments as ordered.  Evaluation of Outcomes: Progressing   LCSW Treatment Plan for Primary Diagnosis: Major depressive disorder, single episode, severe without psychosis ([place]) Long Term Goal(s): Safe transition to appropriate next level of care at discharge, Engage patient in therapeutic group addressing interpersonal concerns.  Short Term Goals: Engage patient in aftercare planning with referrals and resources, Increase social support and Increase skills for wellness and recovery  Therapeutic Interventions: Assess for all discharge needs, 1 to 1 time with Social worker, Explore available resources and support systems, Assess for adequacy in community support network, Educate family and significant other(s) on suicide prevention, Complete Psychosocial Assessment, Interpersonal group therapy.  Evaluation of Outcomes: Progressing   Progress in Treatment: Attending groups: Yes. Participating in groups: Yes. Taking medication as prescribed: Yes. Toleration medication: Yes. Family/Significant other contact made: No, will contact:  daughter in law Patient understands diagnosis: Yes. Discussing patient identified problems/goals with staff: Yes. Medical problems stabilized or resolved: Yes. Denies suicidal/homicidal ideation: Yes. Issues/concerns per patient self-inventory: No. Other: none  New problem(s) identified: No, Describe:  none  New Short Term/Long Term Goal(s):PT goal: Get over the grief and learn how to move on.  Discharge Plan or Barriers: CSW assessing for appropriate plan.  Reason for Continuation of Hospitalization: Depression Medication stabilization  Recreational Therapy: Patient Stressors: Death. (Brother passed away) Patient Goal: Patient will identify 3 positive ways of expressing them self x5 days.  Estimated Length of Stay: 3-5  days.  Attendees: Patient: Rachel White 05/26/2017   Physician: Dr. Bary Leriche, MD 05/26/2017   Nursing: Elige Radon, RN 05/26/2017   RN Care Manager: 05/26/2017   Social Worker: [name], LCSW 05/26/2017   Recreational Therapist: Roanna Epley, LRT/CTRS 05/26/2017   Other:  05/26/2017   Other:  05/26/2017   Other: 05/26/2017     Scribe for Treatment Team: Joanne Chars, LCSW 05/26/2017 3:22 PM

## 2017-05-26 NOTE — Progress Notes (Signed)
Recreation Therapy Notes  INPATIENT RECREATION THERAPY ASSESSMENT  Patient Details Name: Rachel White MRN: [id number] DOB: 24-Oct-1950 Today's Date: 2017/06/25  Patient Stressors: Death  Coping Skills:   Exercise, Other (Comment)(House work)  Horticulturist, commercial: Communication, Expressing Yourself, Problem-Solving, Self-Esteem/Confidence  Leisure Interests (2+):  Sports - Other (Comment)([place], Geneticist, molecular)  Awareness of Community Resources:  Yes  Community Resources:    None  Current Use: No  If no, Barriers?: Financial  Patient Strengths:  Development worker, community, taking care of others  Patient Identified Areas of Improvement:  Communicating  Current Recreation Participation:  None  Patient Goal for Hospitalization:  To open up to my family  [place] of Residence:  [place] of Residence:  [place]   Current [place] (including self-harm):  No  Current HI:  No  Consent to Intern Participation: N/A   Nga Rabon 06/25/17, 4:45 PM

## 2017-05-26 NOTE — Progress Notes (Addendum)
Recreation Therapy Notes  Date: 11.07.18  Time: 1:00pm   Location: Craft Room  Behavioral response: Appropriate  Intervention Topic: Goals  Discussion/Intervention: Group content on today was focused on goals. Patients described what goals are and how they define goals. Individuals expressed how they go about setting goals and reaching them. The group identified how important goals are and if they make short term goals to reach long term goals. Patients described how many goals they work on at a time and what affects them not reaching their goal. Individuals described how much time they put into planning and obtaining their goals. The group participated in the intervention "My Goal Board" and made personal goal boards to help them achieve their goal. Clinical Observations/Feedback:  Patient came to group and define goals as ways to accomplish things.Individual was positively social with peers and staff during group.   Ulisses Vondrak LRT/CTRS         Alyn Riedinger 05/26/2017 2:14 PM

## 2017-05-27 MED ORDER — FLUVOXAMINE MALEATE 50 MG PO TABS
50.0000 mg | ORAL_TABLET | Freq: Every day | ORAL | Status: DC
  Administered 2017-05-27: 50 mg via ORAL
  Filled 2017-05-27: qty 1

## 2017-05-27 NOTE — Progress Notes (Signed)
[facility] MD Progress Note  05/27/2017 1:01 PM Rachel White  MRN:  [id number]  Subjective:   [name] feels much better today. Her mood is good, affect bright. No longer she feels restless. Her sleep was interrupted by disturbance on the unit last night. Tolerates medications well. Excellent program participation.  Treatment plan. We discontinue Prozac and increase Abilify to 10 mg daily. Will try Luvox for OCD tonight.  Social/disposition. Discharge with the husband, follow up with grief counsllor and a new psychiatrist.  Principal Problem: Major depressive disorder, single episode, severe without psychosis ([place]) Diagnosis:   Patient Active Problem List   Diagnosis Date Noted  . Tobacco use disorder [F17.200] 05/24/2017  . Major depressive disorder, single episode, severe without psychosis ([place]) [F32.2] 05/24/2017  . Intentional drug overdose ([place]) [T50.902A]   . Overdose of calcium-channel [place] [T46.1X1A] 05/19/2017  . Overdose of cardiovascular agent [T46.901A] 05/19/2017  . Shock circulatory ([place]) [R57.9]   . Symptomatic bradycardia [R00.1]   . Acute blood loss anemia [D62] 11/10/2015  . Severe epistaxis [R04.0] 11/09/2015  . Epistaxis [R04.0] 11/08/2015  . Dehydration [E86.0] 10/26/2015  . Acute renal failure (ARF) ([place]) [N17.9] 10/26/2015  . Hypotension [I95.9] 10/26/2015  . Hypokalemia [E87.6] 10/26/2015  . Diabetes mellitus ([place]) [E11.9] 10/26/2015  . Sleep apnea [G47.30] 10/26/2015  . Diarrhea [R19.7] 10/26/2015  . Fracture of left tibial plateau [S82.142A] 10/19/2014  . Tibial plateau fracture [S82.143A] 10/19/2014  . Peripheral neuropathy ([place]) [G62.9] 08/01/2012  . Insomnia [G47.00] 08/01/2012  . Chronic diarrhea [K52.9] 08/01/2012   Total Time spent with patient: 20 minutes  Past Psychiatric History: none.  Past Medical History:  Past Medical History:  Diagnosis Date  . Complication of anesthesia    some difficulty waking up after tubes tied  .  Diabetes mellitus without complication ([name])    diet controlled  . Headache    migraines years ago  . Hypertension   . Irritable bowel syndrome    under control  . Neuropathic pain   . Pneumonia    as a child  . Sleep apnea    does not use C_pap    Past Surgical History:  Procedure Laterality Date  . DILATION AND CURETTAGE OF UTERUS    . FOOT SURGERY Left   . GASTRIC BYPASS    . TUBAL LIGATION     Family History:  Family History  Problem Relation Age of Onset  . Cancer Mother   . Cancer Other   . COPD Other   . Diabetes type II Neg Hx    Family Psychiatric  History: none. Social History:  Social History   Substance and Sexual Activity  Alcohol Use No  . Alcohol/week: 0.0 oz     Social History   Substance and Sexual Activity  Drug Use No    Social History   Socioeconomic History  . Marital status: Married    Spouse name: None  . Number of children: None  . Years of education: None  . Highest education level: None  Social Needs  . Financial resource strain: None  . Food insecurity - worry: None  . Food insecurity - inability: None  . Transportation needs - medical: None  . Transportation needs - non-medical: None  Occupational History  . None  Tobacco Use  . Smoking status: Current Every Day Smoker    Packs/day: 1.00    Years: 41.00    Pack years: 41.00    Types: Cigarettes  . Smokeless tobacco: Never Used  .  Tobacco comment: quit 3 months ago  Substance and Sexual Activity  . Alcohol use: No    Alcohol/week: 0.0 oz  . Drug use: No  . Sexual activity: No    Birth control/protection: None  Other Topics Concern  . None  Social History Narrative  . None   Additional Social History:                         Sleep: Poor  Appetite:  Fair  Current Medications: Current Facility-Administered Medications  Medication Dose Route Frequency Provider Last Rate Last Dose  . acetaminophen (TYLENOL) tablet 650 mg  650 mg Oral Q6H PRN  Inioluwa Boulay B, MD      . alum & mag hydroxide-simeth (MAALOX/MYLANTA) 200-200-20 MG/5ML suspension 30 mL  30 mL Oral Q4H PRN Elicia Lui B, MD      . ARIPiprazole (ABILIFY) tablet 10 mg  10 mg Oral Daily Quantavia Frith B, MD   10 mg at 05/27/17 0805  . atorvastatin (LIPITOR) tablet 10 mg  10 mg Oral q1800 Faiga Stones B, MD   10 mg at 05/26/17 1637  . diphenhydrAMINE (BENADRYL) capsule 50 mg  50 mg Oral QHS PRN Clapacs, Madie Reno, MD   50 mg at 05/25/17 2239  . gabapentin (NEURONTIN) capsule 800 mg  800 mg Oral BID Saveah Bahar B, MD   800 mg at 05/27/17 0806  . lisinopril (PRINIVIL,ZESTRIL) tablet 10 mg  10 mg Oral Daily Joyclyn Plazola B, MD   10 mg at 05/27/17 0805  . magnesium hydroxide (MILK OF MAGNESIA) suspension 30 mL  30 mL Oral Daily PRN Johnmichael Melhorn B, MD      . meloxicam (MOBIC) tablet 15 mg  15 mg Oral Daily Shakeema Lippman B, MD   15 mg at 05/27/17 0806  . temazepam (RESTORIL) capsule 15 mg  15 mg Oral QHS Kelon Easom B, MD   15 mg at 05/26/17 2117    Lab Results:  Results for orders placed or performed during the hospital encounter of 05/24/17 (from the past 48 hour(s))  Lipid panel     Status: Abnormal   Collection Time: 05/26/17  7:05 AM  Result Value Ref Range   Cholesterol 154 0 - 200 mg/dL   Triglycerides 225 (H) <150 mg/dL   HDL 25 (L) >40 mg/dL   Total CHOL/HDL Ratio 6.2 RATIO   VLDL 45 (H) 0 - 40 mg/dL   LDL Cholesterol 84 0 - 99 mg/dL    Comment:        Total Cholesterol/HDL:CHD Risk Coronary Heart Disease Risk Table                     Men   Women  1/2 Average Risk   3.4   3.3  Average Risk       5.0   4.4  2 X Average Risk   9.6   7.1  3 X Average Risk  23.4   11.0        Use the calculated Patient Ratio above and the CHD Risk Table to determine the patient's CHD Risk.        ATP III CLASSIFICATION (LDL):  <100     mg/dL   Optimal  100-129  mg/dL   Near or Above                    Optimal  130-159   mg/dL   Borderline  160-189  mg/dL   High  >190     mg/dL   Very High   TSH     Status: None   Collection Time: 05/26/17  7:05 AM  Result Value Ref Range   TSH 2.771 0.350 - 4.500 uIU/mL    Comment: Performed by a 3rd Generation assay with a functional sensitivity of <=0.01 uIU/mL.  Hemoglobin A1c     Status: Abnormal   Collection Time: 05/26/17  7:05 AM  Result Value Ref Range   Hgb A1c MFr Bld 6.0 (H) 4.8 - 5.6 %    Comment: (NOTE) Pre diabetes:          5.7%-6.4% Diabetes:              >6.4% Glycemic control for   <7.0% adults with diabetes    Mean Plasma Glucose 125.5 mg/dL    Comment: Performed at [facility] [address] [place], [place] [address]    Blood Alcohol level:  Lab Results  Component Value Date   ETH <10 79/09/8331    Metabolic Disorder Labs: Lab Results  Component Value Date   HGBA1C 6.0 (H) 05/26/2017   MPG 125.5 05/26/2017   MPG 117 10/27/2015   No results found for: PROLACTIN Lab Results  Component Value Date   CHOL 154 05/26/2017   TRIG 225 (H) 05/26/2017   HDL 25 (L) 05/26/2017   CHOLHDL 6.2 05/26/2017   VLDL 45 (H) 05/26/2017   LDLCALC 84 05/26/2017   LDLCALC 136 (H) 08/01/2012    Physical Findings: AIMS:  , ,  ,  ,    CIWA:    COWS:     Musculoskeletal: Strength & Muscle Tone: within normal limits Gait & Station: normal Patient leans: N/A  Psychiatric Specialty Exam: Physical Exam  Nursing note and vitals reviewed. Psychiatric: Her speech is normal and behavior is normal. Judgment and thought content normal. Her mood appears anxious. Cognition and memory are normal.    Review of Systems  Musculoskeletal: Positive for back pain.  Neurological: Positive for tingling.  Psychiatric/Behavioral: Negative.   All other systems reviewed and are negative.   Blood pressure 139/75, pulse 85, temperature 98.3 F (36.8 C), resp. rate 18, height 5' 2.75" (1.594 m), weight 72.6 kg (160 lb), SpO2 96 %.Body mass index is 28.57  kg/m.  General Appearance: Casual  Eye Contact:  Good  Speech:  Clear and Coherent  Volume:  Normal  Mood:  Anxious  Affect:  Appropriate  Thought Process:  Goal Directed and Descriptions of Associations: Intact  Orientation:  Full (Time, Place, and Person)  Thought Content:  WDL  Suicidal Thoughts:  No  Homicidal Thoughts:  No  Memory:  Immediate;   Fair Recent;   Fair Remote;   Fair  Judgement:  Impaired  Insight:  Shallow  Psychomotor Activity:  Normal  Concentration:  Concentration: Fair and Attention Span: Fair  Recall:  [employer] of Knowledge:  Fair  Language:  Fair  Akathisia:  No  Handed:  Right  AIMS (if indicated):     Assets:  Communication Skills Desire for Improvement Financial Resources/Insurance Housing Intimacy Resilience Social Support  ADL's:  Intact  Cognition:  WNL  Sleep:  Number of Hours: 6.45     Treatment Plan Summary: Daily contact with patient to assess and evaluate symptoms and progress in treatment and Medication management   Ms. Fick is a 66 year old female with no past psychiatric history transferred from [facility] medical floor where she was hospitalized  after overdose on calcium channel [place] in the context of major loss.   # Suicidal ideation, resolved -patient is able to contract for safety in the hospital  # Mood - Continue Abilify 10 mg daily -Start Luvox 50 mg tonight   # Neuropathy - Continue Neurontin 800 mg BID  # HTN - Continue Lisinopril 10 mg daily  # Dyslipidemia - Continue Lipitor 10 mg daily  # Back pain - Continue Mobic 15 mg daily  # Metabolic syndrome monitoring - Lipid panel shows elevated TG, normal TSH and HgbA1C of 6.0. - EKG, QTc 418  # Disposition - Discharge with family - Follow up with Hospice for griefcounselingand a new psychiatrist      Orson Slick, MD 05/27/2017, 1:01 PM

## 2017-05-27 NOTE — Progress Notes (Signed)
Patient ID: Rachel White, female   DOB: Apr 29, 1951, 66 y.o.   MRN: [id number] A&Ox3, denied pain, pleasant, polite, interacting well with peers and staffs, denied pain, denied SI/SIB/HI.

## 2017-05-27 NOTE — Progress Notes (Signed)
D: Patient appears to be minimizing her reason for being hospitalized at [facility].  She is talkative and is interacting well with staff.  She denies any thoughts of self harm today.  Her goal today is to "converse with my support group.  Mainly family."  Patient complained of some physical pain, particularly lower back pain.  She does not appear to be responding to internal stimuli.  She is pleasant and rates all her depressive symptoms as 0.  She states she didn't sleep as well last night due to a late admission of another patient.   A: Continue to monitor medication management and MD orders.  Safety checks completed every 15 minutes per protocol R: Patient is receptive to staff; her behavior is appropriate.

## 2017-05-27 NOTE — BH Assessment (Signed)
Met patient in room.  Pt. Reading booklet she was given.  Pt. States good groups today.  Pt. Stated she really liked "[facility]".  Pt. Denies SI or HI.  Pt. States her back was hurting earlier but was given tylenol "pt.  Patient stated back "feeling much better now".   Pt. States her doctor felt like patient could go home tomorrow, pt good with that decision.

## 2017-05-27 NOTE — BHH Suicide Risk Assessment (Signed)
[place] INPATIENT:  Family/Significant Other Suicide Prevention Education  Suicide Prevention Education:  Education Completed; Nadara Mustard Sorlie, husband, [phone], has been identified by the patient as the family member/significant other with whom the patient will be residing, and identified as the person(s) who will aid the patient in the event of a mental health crisis (suicidal ideations/suicide attempt).  With written consent from the patient, the family member/significant other has been provided the following suicide prevention education, prior to the and/or following the discharge of the patient.  The suicide prevention education provided includes the following:  Suicide risk factors  Suicide prevention and interventions  National Suicide Hotline telephone number  [facility] assessment telephone number  [facility] Emergency Assistance [place] and/or Residential Mobile Crisis Unit telephone number  Request made of family/significant other to:  Remove weapons (e.g., guns, rifles, knives), all items previously/currently identified as safety concern.  No guns in the home, per [facility].  Remove drugs/medications (over-the-counter, prescriptions, illicit drugs), all items previously/currently identified as a safety concern. Nadara Mustard has some concerns that pt has used some of her brother's medication when he was still alive.  He is going to check the medications in the home and discuss with pt what she is supposed to be taking at this time.  The family member/significant other verbalizes understanding of the suicide prevention education information provided.  The family member/significant other agrees to remove the items of safety concern listed above.  Nadara Mustard does worry that pt stay home all day and gets depressed because she is home alone with nothing to do.  Pt has not shared much of anything and he was not aware that she was depressed prior to the overdose.  She  seemed to be doing ok with dealing with her brother's death.  Joanne Chars, LCSW 05/27/2017, 1:30 PM

## 2017-05-27 NOTE — [facility] Group Notes (Signed)
Goals Group Date/Time: 05/27/2017 9:00 AM Type of Therapy and Topic: Group Therapy: Goals Group: SMART Goals   Participation Level: Moderate  Description of Group:    The purpose of a daily goals group is to assist and guide patients in setting recovery/wellness-related goals. The objective is to set goals as they relate to the crisis in which they were admitted. Patients will be using SMART goal modalities to set measurable goals. Characteristics of realistic goals will be discussed and patients will be assisted in setting and processing how one will reach their goal. Facilitator will also assist patients in applying interventions and coping skills learned in psycho-education groups to the SMART goal and process how one will achieve defined goal.   Therapeutic Goals:   -Patients will develop and document one goal related to or their crisis in which brought them into treatment.  -Patients will be guided by LCSW using SMART goal setting modality in how to set a measurable, attainable, realistic and time sensitive goal.  -Patients will process barriers in reaching goal.  -Patients will process interventions in how to overcome and successful in reaching goal.   Patient's Goal:Pt goal is to have better communication with her family.   Therapeutic Modalities:  Motivational Interviewing  Art gallery manager  SMART goals setting   [name], [employer]

## 2017-05-27 NOTE — [facility] Group Notes (Signed)
[place] Group Notes:  (Nursing/MHT/Case Management/Adjunct)  Date:  05/27/2017  Time:  9:12 PM  Type of Therapy:  Evening Wrap-up Group  Participation Level:  Active  Participation Quality:  Appropriate and Attentive  Affect:  Appropriate  Cognitive:  Alert and Appropriate  Insight:  Appropriate, Good and Improving  Engagement in Group:  Developing/Improving and Engaged  Modes of Intervention:  Discussion  Summary of Progress/Problems:  Rachel White 05/27/2017, 9:12 PM

## 2017-05-27 NOTE — [facility] Group Notes (Signed)
[place] LCSW Group Therapy Note  Date/Time: 05/27/17, 0930  Type of Therapy/Topic:  Group Therapy:  Balance in Life  Participation Level:  active  Description of Group:    This group will address the concept of balance and how it feels and looks when one is unbalanced. Patients will be encouraged to process areas in their lives that are out of balance, and identify reasons for remaining unbalanced. Facilitators will guide patients utilizing problem- solving interventions to address and correct the stressor making their life unbalanced. Understanding and applying boundaries will be explored and addressed for obtaining  and maintaining a balanced life. Patients will be encouraged to explore ways to assertively make their unbalanced needs known to significant others in their lives, using other group members and facilitator for support and feedback.  Therapeutic Goals: 1. Patient will identify two or more emotions or situations they have that consume much of in their lives. 2. Patient will identify signs/triggers that life has become out of balance:  3. Patient will identify two ways to set boundaries in order to achieve balance in their lives:  4. Patient will demonstrate ability to communicate their needs through discussion and/or role plays  Summary of Patient Progress:Pt identified mental/emotional and family relationships as areas that are out of balance in her life. Pt shared with group how she has not been honest with her family about how she is doing and they had no other way of knowing.  Good participation.          Therapeutic Modalities:   Cognitive Behavioral Therapy Solution-Focused Therapy Assertiveness Training  [name], [place]

## 2017-05-27 NOTE — Social Work (Signed)
Call from PCP office, [facility], [place] [phone]).  CSW had requested referral for medications management w [facility] provider as patient is connected w this practice - CSW was unable to reach [name] at this time, will attempt tomorrow.  Edwyna Shell, LCSW Lead Clinical Social Worker Phone:  [phone]

## 2017-05-27 NOTE — Plan of Care (Signed)
Patient slept for Estimated Hours of 6.45; Precautionary checks every 15 minutes for safety maintained, room free of safety hazards, patient sustains no injury or falls during this shift.

## 2017-05-27 NOTE — Progress Notes (Signed)
Recreation Therapy Notes  Date: 11.08.18  Time: 3:00pm  Location: Craft room  Behavioral response: Appropriate  Group Type: Craft  Participation level: Active  Communication: Patient was social with peers and staff.  Comments: Patient left group early due to unknown reasons.  Rachel White LRT/CTRS        Rachel White 05/27/2017 4:10 PM

## 2017-05-27 NOTE — [facility] Group Notes (Signed)
[facility] Group Notes:  (Nursing/MHT/Case Management/Adjunct)  Date:  05/27/2017  Time:  7:39 PM  Type of Therapy:  Psychoeducational Skills  Participation Level:  Active  Participation Quality:  Appropriate and Attentive  Affect:  Appropriate  Cognitive:  Alert and Appropriate  Insight:  Appropriate and Good  Engagement in Group:  Engaged  Modes of Intervention:  Discussion and Education  Summary of Progress/Problems:  Rachel White 05/27/2017, 7:39 PM

## 2017-05-27 NOTE — Progress Notes (Signed)
Recreation Therapy Notes  Date: 11.08.18  Time: 1:00pm   Location: Craft Room  Behavioral response: Appropriate  Intervention Topic: Life Planning  Discussion/Intervention: Group Content on today was focused on Life planning. The group described what plans are and how they go about making them. Individuals discussed how they normally plan their day and how long it takes. Patients described what is normally in their plans for the day and if they can plan a day with no money. The group expressed how much free time they normally have in a day. Individuals participated in the intervention "Making a Budget" and learned how much they were spending on things and ways to make plans for their money. Clinical Observations/Feedback:  Patient came to group and expressed that she normally plan her day in advance in her head. Individual expressed that she has too much free time in her day. She was positive with peers and staff during group.   [name] LRT/CTRS         Atiyana Welte 05/27/2017 2:15 PM

## 2017-05-27 NOTE — Plan of Care (Signed)
[name] reports decreased feelings of anxiety at this time.

## 2017-05-28 DIAGNOSIS — F429 Obsessive-compulsive disorder, unspecified: Secondary | ICD-10-CM

## 2017-05-28 MED ORDER — DIPHENHYDRAMINE HCL 50 MG PO CAPS: 50.0000 mg | ORAL_CAPSULE | Freq: Every evening | ORAL | 0 refills | Status: DC | PRN

## 2017-05-28 MED ORDER — ARIPIPRAZOLE 10 MG PO TABS: 10.0000 mg | ORAL_TABLET | Freq: Every day | ORAL | 1 refills | Status: DC

## 2017-05-28 MED ORDER — GABAPENTIN 400 MG PO CAPS: 800.0000 mg | ORAL_CAPSULE | Freq: Two times a day (BID) | ORAL | 0 refills | Status: DC

## 2017-05-28 NOTE — Discharge Summary (Signed)
Physician Discharge Summary Note  Patient:  Rachel White is an 66 y.o., female MRN:  [id number] DOB:  04/03/1951 Patient phone:  [phone] (home)  Patient address:   [place] [address],  Total Time spent with patient: 30 minutes  Date of Admission:  05/24/2017 Date of Discharge: 05/28/2017  Reason for Admission:  Overdose.  Identifying data. Rachel White is a 66 year old female with no past psychiatric history.  Chief complaints. "I did not care."  History of present illness. Information was obtained from the patient and the chart. The patient was transferred to [facility] from [place] floor where she was hospitalized after serious overdose on calcium channel [place] requiring ICU admission. She had been taking care of her brother for almost two years who suffered liver failure. He died in 03-Mar-2023 and the patient became severely depressed but did not seek any help. Last Wednesday, she took overdose of medications.  She was overwhelmed with grief, did not care and wanted to die. Her husband, who came home from work early that day found her unconscious. She apparently fell down the stairs as well. She reports many symptoms of depression but sleep well and gain about 10 lbs since she stopped smoking 3 months ago. She feels guilty but mostly sad. Her energy and concentration are low, she isolates herself, cries all the time, has no pleasure in life even with her grandchildren. She denies psychotic symptoms or anxiety. Sometimes she cleans too much. There are no substances involved.   During the interview, the patient looks really sad and cries all the time. She is glad to be alive and no longer is suicidal after talking to her family.  Past psychiatric history. None.  Family psychiatric history. None.  Social history. She lives with her husband and one of her four children. Close knit family. 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  Principal  Problem: Major depressive disorder, single episode, severe with psychotic features [facility]) Discharge Diagnoses: Patient Active Problem List   Diagnosis Date Noted  . OCD (obsessive compulsive disorder) [F42.9] 05/28/2017  . Tobacco use disorder [F17.200] 05/24/2017  . Major depressive disorder, single episode, severe with psychotic features ([place]) [F32.3] 05/24/2017  . Intentional drug overdose ([place]) [T50.902A]   . Overdose of calcium-channel [place] [T46.1X1A] 05/19/2017  . Overdose of cardiovascular agent [T46.901A] 05/19/2017  . Shock circulatory ([place]) [R57.9]   . Symptomatic bradycardia [R00.1]   . Acute blood loss anemia [D62] 11/10/2015  . Severe epistaxis [R04.0] 11/09/2015  . Epistaxis [R04.0] 11/08/2015  . Dehydration [E86.0] 10/26/2015  . Acute renal failure (ARF) ([place]) [N17.9] 10/26/2015  . Hypotension [I95.9] 10/26/2015  . Hypokalemia [E87.6] 10/26/2015  . Diabetes mellitus ([place]) [E11.9] 10/26/2015  . Sleep apnea [G47.30] 10/26/2015  . Diarrhea [R19.7] 10/26/2015  . Fracture of left tibial plateau [S82.142A] 10/19/2014  . Tibial plateau fracture [S82.143A] 10/19/2014  . Peripheral neuropathy ([place]) [G62.9] 08/01/2012  . Insomnia [G47.00] 08/01/2012  . Chronic diarrhea [K52.9] 08/01/2012   Past Medical History:  Past Medical History:  Diagnosis Date  . Complication of anesthesia    some difficulty waking up after tubes tied  . Diabetes mellitus without complication ([name])    diet controlled  . Headache    migraines years ago  . Hypertension   . Irritable bowel syndrome    under control  . Neuropathic pain   . Pneumonia    as a child  . Sleep apnea    does not use  C_pap    Past Surgical History:  Procedure Laterality Date  . DILATION AND CURETTAGE OF UTERUS    . FOOT SURGERY Left   . GASTRIC BYPASS    . TUBAL LIGATION     Family History:  Family History  Problem Relation Age of Onset  . Cancer Mother   . Cancer Other   . COPD Other   . Diabetes type II  Neg Hx    Social History:  Social History   Substance and Sexual Activity  Alcohol Use No  . Alcohol/week: 0.0 oz     Social History   Substance and Sexual Activity  Drug Use No    Social History   Socioeconomic History  . Marital status: Married    Spouse name: None  . Number of children: None  . Years of education: None  . Highest education level: None  Social Needs  . Financial resource strain: None  . Food insecurity - worry: None  . Food insecurity - inability: None  . Transportation needs - medical: None  . Transportation needs - non-medical: None  Occupational History  . None  Tobacco Use  . Smoking status: Current Every Day Smoker    Packs/day: 1.00    Years: 41.00    Pack years: 41.00    Types: Cigarettes  . Smokeless tobacco: Never Used  . Tobacco comment: quit 3 months ago  Substance and Sexual Activity  . Alcohol use: No    Alcohol/week: 0.0 oz  . Drug use: No  . Sexual activity: No    Birth control/protection: None  Other Topics Concern  . None  Social History Narrative  . None    Hospital Course:    Rachel White is a 66 year old female with no past psychiatric history transferred from [facility] medical floor where she was hospitalized after overdose on calcium channel [place] in the context of major loss. She became restless when we started Prozac. She was continued on Abilify with full resolution of symptoms.  # Suicidal ideation, resolved -patient adamantly denies any thoughts, intention or plans to hurt herself or others. She is able to contract for safety. She is forward thinking and optimistic about the future. She is a loving mother and grandmother.   # Mood -Continue Abilify 10 mg daily  #Insomnia - patient was given Restoril 15 mg in the hospital -patient opted for Benadryl at home  # Neuropathy -Continue Neurontin 800 mg BID  # HTN - Continue Lisinopril 10 mg daily  # Dyslipidemia - Continue Lipitor 10 mg daily  # Back  pain - Continue Mobic 15 mg daily  #Smoking - Nicotine patch was available  # Metabolic syndrome monitoring - Lipid panelshows elevated TG,normalTSH and HgbA1Cof 6.0. - EKG, QTc 418  # Disposition - Discharge with family - Follow up with Hospice for griefcounselingand a new psychiatrist  Physical Findings: AIMS:  , ,  ,  ,    CIWA:    COWS:     Musculoskeletal: Strength & Muscle Tone: within normal limits Gait & Station: normal Patient leans: N/A  Psychiatric Specialty Exam: Physical Exam  Nursing note and vitals reviewed. Psychiatric: She has a normal mood and affect. Her speech is normal and behavior is normal. Judgment and thought content normal. Cognition and memory are normal.    Review of Systems  Musculoskeletal: Positive for back pain and joint pain.  Neurological: Negative.   Psychiatric/Behavioral: Negative.   All other systems reviewed and are negative.   Blood pressure  119/72, pulse 75, temperature 98.1 F (36.7 C), temperature source Oral, resp. rate 18, height 5' 2.75" (1.594 m), weight 72.6 kg (160 lb), SpO2 96 %.Body mass index is 28.57 kg/m.  General Appearance: Casual  Eye Contact:  Good  Speech:  Clear and Coherent  Volume:  Normal  Mood:  Euthymic  Affect:  Appropriate  Thought Process:  Goal Directed and Descriptions of Associations: Intact  Orientation:  Full (Time, Place, and Person)  Thought Content:  WDL  Suicidal Thoughts:  No  Homicidal Thoughts:  No  Memory:  Immediate;   Fair Recent;   Fair Remote;   Fair  Judgement:  Impaired  Insight:  Shallow  Psychomotor Activity:  Normal  Concentration:  Concentration: Fair and Attention Span: Fair  Recall:  [employer] of Knowledge:  Fair  Language:  Fair  Akathisia:  No  Handed:  Right  AIMS (if indicated):     Assets:  Communication Skills Desire for Improvement Financial Resources/Insurance Housing Intimacy Physical Health Resilience Social Support Transportation   ADL's:  Intact  Cognition:  WNL  Sleep:  Number of Hours: 6.25     Have you used any form of tobacco in the last 30 days? (Cigarettes, Smokeless Tobacco, Cigars, and/or Pipes): No  Has this patient used any form of tobacco in the last 30 days? (Cigarettes, Smokeless Tobacco, Cigars, and/or Pipes) Yes, Yes, A prescription for an FDA-approved tobacco cessation medication was offered at discharge and the patient refused  Blood Alcohol level:  Lab Results  Component Value Date   ETH <10 16/01/3709    Metabolic Disorder Labs:  Lab Results  Component Value Date   HGBA1C 6.0 (H) 05/26/2017   MPG 125.5 05/26/2017   MPG 117 10/27/2015   No results found for: PROLACTIN Lab Results  Component Value Date   CHOL 154 05/26/2017   TRIG 225 (H) 05/26/2017   HDL 25 (L) 05/26/2017   CHOLHDL 6.2 05/26/2017   VLDL 45 (H) 05/26/2017   LDLCALC 84 05/26/2017   LDLCALC 136 (H) 08/01/2012    See Psychiatric Specialty Exam and Suicide Risk Assessment completed by Attending Physician prior to discharge.  Discharge destination:  Home  Is patient on multiple antipsychotic therapies at discharge:  No   Has Patient had three or more failed trials of antipsychotic monotherapy by history:  No  Recommended Plan for Multiple Antipsychotic Therapies: NA  Discharge Instructions    Diet - low sodium heart healthy   Complete by:  As directed    Increase activity slowly   Complete by:  As directed      Allergies as of 05/28/2017      Reactions   Sulfa Antibiotics Other (See Comments)   Pt. Does not recall the reaction    Trazodone Other (See Comments)   Visual Disturbances as well as Tingling sensation   Zolpidem Tartrate Other (See Comments)   Altered mental status       Medication List    TAKE these medications     Indication  ARIPiprazole 10 MG tablet Commonly known as:  ABILIFY Take 1 tablet (10 mg total) daily by mouth.  Indication:  Major Depressive Disorder   atorvastatin 10 MG  tablet Commonly known as:  LIPITOR Take 10 mg by mouth every evening.  Indication:  High Amount of Fats in the Blood   bifidobacterium infantis capsule Take 1 capsule by mouth daily.  Indication:  chronic diarrhea   diphenhydrAMINE 50 MG capsule Commonly known as:  BENADRYL  Take 1 capsule (50 mg total) at bedtime as needed by mouth for sleep.  Indication:  Trouble Sleeping   gabapentin 400 MG capsule Commonly known as:  NEURONTIN Take 2 capsules (800 mg total) 2 (two) times daily by mouth.  Indication:  Neuropathic Pain   lisinopril 10 MG tablet Commonly known as:  PRINIVIL,ZESTRIL Take 10 mg by mouth every morning.  Indication:  High Blood Pressure Disorder   meloxicam 15 MG tablet Commonly known as:  MOBIC Take 15 mg by mouth as needed.  Indication:  Joint Damage causing Pain and Loss of Function   VIACTIV MULTI-VITAMIN Chew Chew 1 each by mouth daily.  Indication:  general health      Follow-up Information    [facility]AL [facility] OUTPATIENT [facility] [place] Follow up on 07/26/2017.   Specialty:  Behavioral Health Why:  Initial appointment for medications managementw Dr Adele Schilder on 1/7 at 8 AM.  Please arrive at 8 AM sharp in order to complete paperwork.  Please call to cancel/reschedule if needed.   Contact information: [place] [id number] mc [place] [phone]       Jefm Petty, MD Follow up on 06/03/2017.   Specialty:  Family Medicine Why:  Hospital discharge follow up w PCP Antonieta Pert on 11/15 at 2 PM.  Please call to cancel/reschedule if needed.  Bring photo ID, arrive 15 minutes early.   Contact information: [address] [place] [address] [phone]        [facility] Follow up.   Why:  Initial appointment for therapy on 11/13 at 2 PM.  Please call office after discharge to confirm appointment.   Contact information: [address] [place]  [address] Phone: [phone] Fax:  [phone]          Follow-up recommendations:  Activity:  as tolerated Diet:  low sodium heart healthy Other:  keep follow up appointments  Comments:    Signed: Orson Slick, MD 05/28/2017, 8:25 AM

## 2017-05-28 NOTE — Progress Notes (Signed)
  [facility] Adult Case Management Discharge Plan :  Will you be returning to the same living situation after discharge:  Yes,  with husband At discharge, do you have transportation home?: Yes,  husband Do you have the ability to pay for your medications: Yes,  medicare  Release of information consent forms completed and in the chart;  Patient's signature needed at discharge.  Patient to Follow up at: Follow-up Information    BEHAVIORAL HEALTH OUTPATIENT THERAPY [place] Follow up on 07/26/2017.   Specialty:  Behavioral Health Why:  Initial appointment for medications managementw Dr Adele Schilder on 1/7 at 8 AM.  Please arrive at 8 AM sharp in order to complete paperwork.  Please call to cancel/reschedule if needed.   Contact information: [place] [id number] mc [place] [phone]       Jefm Petty, MD Follow up on 06/03/2017.   Specialty:  Family Medicine Why:  Hospital discharge follow up w PCP Antonieta Pert on 11/15 at 2 PM.  Please call to cancel/reschedule if needed.  Bring photo ID, arrive 15 minutes early.   Contact information: [address] [place] [address] [phone]        [facility] Follow up.   Why:  Initial appointment for therapy on 11/13 at 2 PM.  Please call office after discharge to confirm appointment.   Contact information: [address] [place]  [address] Phone: [phone] Fax:  [phone]       [facility]   Specialty:  Hospice and Palliative Medicine Why:  Please call Hospice to request individual or group counseling. Contact information: [place] [address] [phone]           Next level of care provider has access to [place] and Suicide Prevention discussed: Yes,  with husband  Have you used any form of tobacco in the last 30 days? (Cigarettes, Smokeless Tobacco, Cigars, and/or Pipes):  No  Has patient been referred to the Quitline?: N/A patient is not a smoker  Patient has been referred for addiction treatment: Yes  Joanne Chars, [place] 05/28/2017, 12:22 PM

## 2017-05-28 NOTE — Progress Notes (Signed)
Recreation Therapy Notes  Date: 11.09.18  Time: 9:30 am  Location: Craft Room  Behavioral response: Appropriate  Intervention Topic: Communication  Discussion/Intervention: Group content on today was focused on communication. The group defined communication and described healthy ways they communicate. Individuals expressed ways they have dealt with communication in the past. Patients identified the key components in communication and the most important part of communication. The group participated in the intervention "Board Scramble" patient were broken into groups had to communicate with one another to unscramble the words on the board.  Clinical Observations/Feedback:  Patient came to group and expressed that communication is just conversation. She described that when others communicate they exchange ideas. Individual stated that communication can be one on one and verbal or nonverbal. She expressed that sometimes when communicating with others becomes frustrated.   Jeneva Schweizer LRT/CTRS         Corri Delapaz 05/28/2017 2:09 PM

## 2017-05-28 NOTE — Progress Notes (Signed)
Patient ID: Rachel White, female   DOB: [birth year], 66 y.o.   MRN: [id number]   Received [place] this am after breakfast, she was compliant with her medications. She denied all of the psychiatric symptoms and feels safe to be discharge home this afternoon. She stated her husband will arrive at 51 to take her home. She has been OOB in the milieu throughout the day,in the day room and socializing with select peers. She received her discharge order, the AVS was reviewed and her questions answered. She received her personal belongings and discharge without incident with her husband.

## 2017-05-28 NOTE — Progress Notes (Signed)
Recreation Therapy Notes  INPATIENT RECREATION TR PLAN  Patient Details Name: Rachel White MRN: [id number] DOB: 1950/10/27 Today's Date: 05/28/2017  Rec Therapy Plan Is patient appropriate for Therapeutic Recreation?: Yes Treatment times per week: at least 3 Estimated Length of Stay: 5-7 days TR Treatment/Interventions: Group participation (Comment)(Appropriate participation in recreation therapy tx.)  Discharge Criteria Pt will be discharged from therapy if:: Discharged Treatment plan/goals/alternatives discussed and agreed upon by:: Patient/family  Discharge Summary Short term goals set: Patient will identify 3 positive ways of expressing them self x5 days. Short term goals met: Complete Progress toward goals comments: Groups attended Which groups?: Communication, Anger management, Goal setting, Other (Comment)(Life Planning) Reason goals not met: N/A Therapeutic equipment acquired: N/A Reason patient discharged from therapy: Discharge from hospital Pt/family agrees with progress & goals achieved: Yes Date patient discharged from therapy: 05/28/17   Perline Awe 05/28/2017, 2:12 PM

## 2017-05-28 NOTE — BHH Suicide Risk Assessment (Signed)
[facility] Discharge Suicide Risk Assessment   Principal Problem: Major depressive disorder, single episode, severe with psychotic features [facility]) Discharge Diagnoses:  Patient Active Problem List   Diagnosis Date Noted  . OCD (obsessive compulsive disorder) [F42.9] 05/28/2017  . Tobacco use disorder [F17.200] 05/24/2017  . Major depressive disorder, single episode, severe with psychotic features ([place]) [F32.3] 05/24/2017  . Intentional drug overdose ([place]) [T50.902A]   . Overdose of calcium-channel [place] [T46.1X1A] 05/19/2017  . Overdose of cardiovascular agent [T46.901A] 05/19/2017  . Shock circulatory ([place]) [R57.9]   . Symptomatic bradycardia [R00.1]   . Acute blood loss anemia [D62] 11/10/2015  . Severe epistaxis [R04.0] 11/09/2015  . Epistaxis [R04.0] 11/08/2015  . Dehydration [E86.0] 10/26/2015  . Acute renal failure (ARF) ([place]) [N17.9] 10/26/2015  . Hypotension [I95.9] 10/26/2015  . Hypokalemia [E87.6] 10/26/2015  . Diabetes mellitus ([place]) [E11.9] 10/26/2015  . Sleep apnea [G47.30] 10/26/2015  . Diarrhea [R19.7] 10/26/2015  . Fracture of left tibial plateau [S82.142A] 10/19/2014  . Tibial plateau fracture [S82.143A] 10/19/2014  . Peripheral neuropathy ([place]) [G62.9] 08/01/2012  . Insomnia [G47.00] 08/01/2012  . Chronic diarrhea [K52.9] 08/01/2012    Total Time spent with patient: 30 minutes  Musculoskeletal: Strength & Muscle Tone: within normal limits Gait & Station: normal Patient leans: N/A  Psychiatric Specialty Exam: Review of Systems  Musculoskeletal: Positive for back pain and joint pain.  Neurological: Negative.   Psychiatric/Behavioral: Negative.   All other systems reviewed and are negative.   Blood pressure 119/72, pulse 75, temperature 98.1 F (36.7 C), temperature source Oral, resp. rate 18, height 5' 2.75" (1.594 m), weight 72.6 kg (160 lb), SpO2 96 %.Body mass index is 28.57 kg/m.  General Appearance: Casual  Eye Contact::  Good  Speech:  Clear and  Coherent409  Volume:  Normal  Mood:  Euthymic  Affect:  Appropriate  Thought Process:  Goal Directed and Descriptions of Associations: Intact  Orientation:  Full (Time, Place, and Person)  Thought Content:  WDL  Suicidal Thoughts:  No  Homicidal Thoughts:  No  Memory:  Immediate;   Fair Recent;   Fair Remote;   Fair  Judgement:  Impaired  Insight:  Shallow  Psychomotor Activity:  Normal  Concentration:  Fair  Recall:  [place]  Language: Fair  Akathisia:  No  Handed:  Right  AIMS (if indicated):     Assets:  Communication Skills Desire for Improvement Financial Resources/Insurance Housing Intimacy Physical Health Resilience Social Support  Sleep:  Number of Hours: 6.25  Cognition: WNL  ADL's:  Intact   Mental Status Per Nursing Assessment::   On Admission:     Demographic Factors:  Age 66 or older and Caucasian  Loss Factors: Loss of significant relationship  Historical Factors: Impulsivity  Risk Reduction Factors:   Sense of responsibility to family, Living with another person, especially a relative and Positive social support  Continued Clinical Symptoms:  Depression:   Severe  Cognitive Features That Contribute To Risk:  None    Suicide Risk:  Minimal: No identifiable suicidal ideation.  Patients presenting with no risk factors but with morbid ruminations; may be classified as minimal risk based on the severity of the depressive symptoms  Follow-up [facility] Follow up on 07/26/2017.   Specialty:  Behavioral Health Why:  Initial appointment for medications managementw Dr Adele Schilder on 1/7 at 8 AM.  Please arrive at 8 AM sharp in order to complete paperwork.  Please call to cancel/reschedule  if needed.   Contact information: [place] [id number] [address] [place] [address] [place] [phone]       Jefm Petty, MD Follow up on 06/03/2017.   Specialty:  Family  Medicine Why:  Hospital discharge follow up w PCP Antonieta Pert on 11/15 at 2 PM.  Please call to cancel/reschedule if needed.  Bring photo ID, arrive 15 minutes early.   Contact information: [address] [place] [address] [phone]        [facility] Follow up.   Why:  Initial appointment for therapy on 11/13 at 2 PM.  Please call office after discharge to confirm appointment.   Contact information: [address] [place]  [address] Phone: [phone] Fax:  [phone]          Plan Of Care/Follow-up recommendations:  Activity:  as toleratwed Diet:  low sodium heart healthy Other:  keep follow up appointments  Orson Slick, MD 05/28/2017, 8:21 AM

## 2017-05-28 NOTE — Progress Notes (Signed)
Patient ID: Rachel White, female   DOB: [birth year], 66 y.o.   MRN: [id number] PER STATE REGULATIONS 482.30  THIS CHART WAS REVIEWED FOR MEDICAL NECESSITY WITH RESPECT TO THE PATIENT'S ADMISSION/ DURATION OF STAY.  NEXT REVIEW DATE: 06/01/2017  Chauncy Lean, RN, BSN CASE MANAGER

## 2017-06-03 DIAGNOSIS — T1491XA Suicide attempt, initial encounter: Secondary | ICD-10-CM | POA: Insufficient documentation

## 2017-06-19 ENCOUNTER — Emergency Department (HOSPITAL_COMMUNITY)
Admission: EM | Admit: 2017-06-19 | Discharge: 2017-06-20 | Disposition: A | Discharge status: Home / Self Care | Payer: Medicare Other | Attending: Emergency Medicine | Admitting: Emergency Medicine

## 2017-06-19 ENCOUNTER — Encounter (HOSPITAL_COMMUNITY): Payer: Self-pay | Admitting: Nurse Practitioner

## 2017-06-19 ENCOUNTER — Other Ambulatory Visit: Payer: Self-pay

## 2017-06-19 ENCOUNTER — Emergency Department (HOSPITAL_COMMUNITY): Payer: Medicare Other

## 2017-06-19 DIAGNOSIS — F17210 Nicotine dependence, cigarettes, uncomplicated: Secondary | ICD-10-CM | POA: Insufficient documentation

## 2017-06-19 DIAGNOSIS — I10 Essential (primary) hypertension: Secondary | ICD-10-CM | POA: Diagnosis not present

## 2017-06-19 DIAGNOSIS — E119 Type 2 diabetes mellitus without complications: Secondary | ICD-10-CM | POA: Diagnosis not present

## 2017-06-19 DIAGNOSIS — R079 Chest pain, unspecified: Secondary | ICD-10-CM | POA: Diagnosis present

## 2017-06-19 DIAGNOSIS — R0789 Other chest pain: Secondary | ICD-10-CM | POA: Insufficient documentation

## 2017-06-19 DIAGNOSIS — Z79899 Other long term (current) drug therapy: Secondary | ICD-10-CM | POA: Insufficient documentation

## 2017-06-19 MED ORDER — CYCLOBENZAPRINE HCL 10 MG PO TABS
10.0000 mg | ORAL_TABLET | Freq: Once | ORAL | Status: AC
  Administered 2017-06-19: 10 mg via ORAL
  Filled 2017-06-19: qty 1

## 2017-06-19 MED ORDER — HYDROCODONE-ACETAMINOPHEN 5-325 MG PO TABS
1.0000 | ORAL_TABLET | Freq: Once | ORAL | Status: AC
  Administered 2017-06-19: 1 via ORAL
  Filled 2017-06-19: qty 1

## 2017-06-19 NOTE — ED Notes (Signed)
Patient states she fell during halloween and hurt her left shoulder. Patient is still having pain in left shoulder. No deformity or bruises noted.

## 2017-06-19 NOTE — ED Provider Notes (Signed)
[facility] DEPT Provider Note   CSN: [id number] Arrival date & time: 06/19/17  1624     History   Chief Complaint Chief Complaint  Patient presents with  . Shoulder Pain  . Flank Pain    HPI Rachel White is a 66 y.o. female.  Patient presents with sharp left chest pain that started 3 days ago without known injury. No fever or significant cough. She reports being at her PCP's office 4 days ago and had to move around off and on the table and thinks she might have pulled something then. No abdominal pain, nausea or vomiting. No midline back pain. Her symptoms are most predominant under the left breast but extend posteriorly and up into the shoulder. No neck pain or headache. She does not feel SOB but reports it hurts this area when she takes a breath.   The history is provided by the patient. No language interpreter was used.  Shoulder Pain   Pertinent negatives include no numbness.  Flank Pain  Associated symptoms include chest pain. Pertinent negatives include no abdominal pain, no headaches and no shortness of breath.    Past Medical History:  Diagnosis Date  . Complication of anesthesia    some difficulty waking up after tubes tied  . Diabetes mellitus without complication ([name])    diet controlled  . Headache    migraines years ago  . Hypertension   . Irritable bowel syndrome    under control  . Neuropathic pain   . Pneumonia    as a child  . Sleep apnea    does not use C_pap    Patient Active Problem List   Diagnosis Date Noted  . OCD (obsessive compulsive disorder) 05/28/2017  . Tobacco use disorder 05/24/2017  . Major depressive disorder, single episode, severe with psychotic features ([place]) 05/24/2017  . Intentional drug overdose ([place])   . Overdose of calcium-channel [place] 05/19/2017  . Overdose of cardiovascular agent 05/19/2017  . Shock circulatory ([place])   . Symptomatic bradycardia   . Acute blood loss anemia  11/10/2015  . Severe epistaxis 11/09/2015  . Epistaxis 11/08/2015  . Dehydration 10/26/2015  . Acute renal failure (ARF) ([place]) 10/26/2015  . Hypotension 10/26/2015  . Hypokalemia 10/26/2015  . Diabetes mellitus ([place]) 10/26/2015  . Sleep apnea 10/26/2015  . Diarrhea 10/26/2015  . Fracture of left tibial plateau 10/19/2014  . Tibial plateau fracture 10/19/2014  . Peripheral neuropathy ([place]) 08/01/2012  . Insomnia 08/01/2012  . Chronic diarrhea 08/01/2012    Past Surgical History:  Procedure Laterality Date  . COLONOSCOPY N/A 10/29/2015   Procedure: COLONOSCOPY;  Surgeon: Clarene Essex, MD;  Location: [facility];  Service: Endoscopy;  Laterality: N/A;  . DILATION AND CURETTAGE OF UTERUS    . FOOT SURGERY Left   . GASTRIC BYPASS    . ORIF TIBIA PLATEAU Left 10/19/2014   Procedure: OPEN REDUCTION INTERNAL FIXATION (ORIF) LEFT BICONDYLAR TIBIAL PLATEAU;  Surgeon: Leandrew Koyanagi, MD;  Location: [facility];  Service: Orthopedics;  Laterality: Left;  . TEMPORARY PACEMAKER N/A 05/19/2017   Procedure: TEMPORARY PACEMAKER;  Surgeon: Leonie Man, MD;  Location: [facility];  Service: Cardiovascular;  Laterality: N/A;  . TUBAL LIGATION      OB History    No data available       Home Medications    Prior to Admission medications   Medication Sig Start Date End Date Taking? Authorizing Provider  ARIPiprazole (ABILIFY) 10 MG tablet Take  1 tablet (10 mg total) daily by mouth. 05/28/17   Pucilowska, Jolanta B, MD  atorvastatin (LIPITOR) 10 MG tablet Take 10 mg by mouth every evening.    [provider]  bifidobacterium infantis (ALIGN) capsule Take 1 capsule by mouth daily.    [provider]  diphenhydrAMINE (BENADRYL) 50 MG capsule Take 1 capsule (50 mg total) at bedtime as needed by mouth for sleep. 05/28/17   Pucilowska, Herma Ard B, MD  gabapentin (NEURONTIN) 400 MG capsule Take 2 capsules (800 mg total) 2 (two) times daily by mouth. 05/28/17   Pucilowska, Jolanta B, MD    lisinopril (PRINIVIL,ZESTRIL) 10 MG tablet Take 10 mg by mouth every morning.    [provider]  meloxicam (MOBIC) 15 MG tablet Take 15 mg by mouth as needed. 02/11/17   [provider]  Multiple Vitamins-Calcium (VIACTIV MULTI-VITAMIN) CHEW Chew 1 each by mouth daily.    [provider]    Family History Family History  Problem Relation Age of Onset  . Cancer Mother   . Cancer Other   . COPD Other   . Diabetes type II Neg Hx     Social History Social History   Tobacco Use  . Smoking status: Current Every Day Smoker    Packs/day: 1.00    Years: 41.00    Pack years: 41.00    Types: Cigarettes  . Smokeless tobacco: Never Used  . Tobacco comment: quit 3 months ago  Substance Use Topics  . Alcohol use: No    Alcohol/week: 0.0 oz  . Drug use: No     Allergies   Sulfa antibiotics; Trazodone; and Zolpidem tartrate   Review of Systems Review of Systems  Constitutional: Negative for chills and fever.  Respiratory: Negative for cough and shortness of breath.   Cardiovascular: Positive for chest pain.  Gastrointestinal: Negative for abdominal pain and nausea.  Genitourinary: Negative for flank pain.  Musculoskeletal: Negative for back pain.  Neurological: Negative for weakness, numbness and headaches.     Physical Exam Updated Vital Signs BP (!) 172/87 (BP Location: Left Arm)   Pulse (!) 103   Temp 99.3 F (37.4 C) (Oral)   Resp 18   Ht 5\' 3"  (1.6 m)   Wt 73 kg (161 lb)   SpO2 99%   BMI 28.52 kg/m   Physical Exam  Constitutional: She is oriented to person, place, and time. She appears well-developed and well-nourished.  HENT:  Head: Normocephalic.  Neck: Normal range of motion. Neck supple.  Cardiovascular: Normal rate and regular rhythm.  Pulmonary/Chest: Effort normal and breath sounds normal. She has no wheezes. She has no rales.        Abdominal: Soft. Bowel sounds are normal. There is no tenderness. There is no rebound  and no guarding.  Musculoskeletal: Normal range of motion.  Neurological: She is alert and oriented to person, place, and time.  Skin: Skin is warm and dry. No rash noted.  Psychiatric: She has a normal mood and affect.     ED Treatments / Results  Labs (all labs ordered are listed, but only abnormal results are displayed) Labs Reviewed - No data to display  EKG  EKG Interpretation None       Radiology No results found. Dg Ribs Unilateral W/chest Left  Result Date: 06/19/2017 CLINICAL DATA:  Left rib and chest pain. EXAM: LEFT RIBS AND CHEST - 3+ VIEW COMPARISON:  05/24/2017 and prior chest radiographs FINDINGS: The cardiomediastinal silhouette is unremarkable. Mild left  basilar atelectasis is present. There is no evidence of focal airspace disease, pulmonary edema, suspicious pulmonary nodule/mass, pleural effusion, or pneumothorax. No acute bony abnormalities are identified. No left rib abnormalities are noted. IMPRESSION: Mild left basilar atelectasis without other significant abnormality. Electronically Signed   By: Margarette Canada M.D.   On: 06/19/2017 23:40   Dg Chest Port 1 View  Result Date: 05/24/2017 CLINICAL DATA:  Shortness of breath, respiratory failure. EXAM: PORTABLE CHEST 1 VIEW COMPARISON:  Radiograph of May 23, 2017. FINDINGS: Stable cardiomediastinal silhouette. Atherosclerosis of thoracic aorta is noted. No pneumothorax is noted. Right-sided PICC line is unchanged in position. Right perihilar atelectasis is noted. Stable mild bibasilar subsegmental atelectasis is noted. No significant pleural effusions are noted. Bony thorax is unremarkable. IMPRESSION: Aortic atherosclerosis. Right perihilar atelectasis is noted. Stable mild bibasilar subsegmental atelectasis. Electronically Signed   By: Marijo Conception, M.D.   On: 05/24/2017 07:57   Dg Chest Port 1 View  Result Date: 05/23/2017 CLINICAL DATA:  66 year old female status post amlodipine overdose. Shortness of  breath, suspected volume overload. EXAM: PORTABLE CHEST 1 VIEW COMPARISON:  05/22/2017 and earlier. FINDINGS: Portable AP semi upright view at 0601 hours. Stable right PICC line. Mildly decreased veiling opacity at both lung bases. Continued dense retrocardiac opacity. Stable cardiac size and mediastinal contours. Visualized tracheal air column is within normal limits. No pneumothorax. No overt pulmonary edema. No areas of worsening ventilation. IMPRESSION: 1. Suspect regression of bilateral pleural effusions since yesterday with mildly improved lung base ventilation. 2. Bilateral lower lobe atelectasis. Electronically Signed   By: Genevie Ann M.D.   On: 05/23/2017 08:35   Dg Chest Port 1 View  Result Date: 05/22/2017 CLINICAL DATA:  66 year old female with a history of respiratory failure EXAM: PORTABLE CHEST 1 VIEW COMPARISON:  05/19/2017 FINDINGS: Cardiomediastinal silhouette unchanged. Interval placement of right upper extremity PICC with the tip appearing to terminate at the superior vena cava. Interval development of hazy opacity at the right base with obscuration the right hemidiaphragm in the right heart border. Interval development of opacity at the left base partially obscuring the left hemidiaphragm. IMPRESSION: Right greater than left pleural effusion with associated atelectasis. Superimposed infection cannot be excluded. Interval placement of right-sided PICC. Electronically Signed   By: Corrie Mckusick D.O.   On: 05/22/2017 09:55   Dg Abd Portable 1v  Result Date: 05/22/2017 CLINICAL DATA:  66 year old female with a history of abdominal pain EXAM: PORTABLE ABDOMEN - 1 VIEW COMPARISON:  CT 03/31/2016 FINDINGS: Gas within small bowel and colon.  No abnormal distention. Paucity of gas within the stomach lumen. Surgical changes again noted in the left upper quadrant. No unexpected calcifications or radiopaque foreign body. Pelvic thermister. IMPRESSION: Nonobstructive bowel gas pattern. Surgical changes  of the left upper quadrant. Electronically Signed   By: Corrie Mckusick D.O.   On: 05/22/2017 09:56    Procedures Procedures (including critical care time)  Medications Ordered in ED Medications  HYDROcodone-acetaminophen (NORCO/VICODIN) 5-325 MG per tablet 1 tablet (not administered)  cyclobenzaprine (FLEXERIL) tablet 10 mg (not administered)     Initial Impression / Assessment and Plan / ED Course  I have reviewed the triage vital signs and the nursing notes.  Pertinent labs & imaging results that were available during my care of the patient were reviewed by me and considered in my medical decision making (see chart for details).     Patient with left chest and shoulder pain that wraps around to her back, progressive over  3 days. No fever. She has not taken anything at home.   She has significant TTP over area of complaint to light touch. Full breath sounds, doubt PTX. Will obtain chest/rib x-ray.   The patient is feeling significantly improved with medications. She appears more comfortable. Imaging is negative for acute finding. She can be discharged home with as needed PCP.  Final Clinical Impressions(s) / ED Diagnoses   Final diagnoses:  None   1. Chest wall pain  ED Discharge Orders    None       Charlann Lange, PA-C 06/20/17 0009    Tegeler, Gwenyth Allegra, MD 06/20/17 [phone]

## 2017-06-19 NOTE — ED Notes (Signed)
Patient shaking legs due to neuropathy while blood pressure was taken.

## 2017-06-19 NOTE — ED Triage Notes (Signed)
Patient reports being at [facility] office yesterday and struggling to sit up on the exam table. She then noticed yesterday afternoon she began having left shoulder pain and left flank/rib pain on the side she was pulling up on. Denies any heavy lifting or taking any OTC medications for the pain. Patient is ambulatory and can move the arm but with some hesitancy due to pain.

## 2017-06-20 MED ORDER — CYCLOBENZAPRINE HCL 10 MG PO TABS: 10.0000 mg | ORAL_TABLET | Freq: Two times a day (BID) | ORAL | 0 refills | Status: AC | PRN

## 2017-06-20 MED ORDER — HYDROCODONE-ACETAMINOPHEN 5-325 MG PO TABS
1.0000 | ORAL_TABLET | ORAL | 0 refills | Status: DC | PRN
Start: 2017-06-20 — End: 2017-07-26

## 2017-06-23 ENCOUNTER — Ambulatory Visit (INDEPENDENT_AMBULATORY_CARE_PROVIDER_SITE_OTHER): Payer: Medicare Other | Admitting: Urgent Care

## 2017-06-23 ENCOUNTER — Encounter: Payer: Self-pay | Admitting: Urgent Care

## 2017-06-23 ENCOUNTER — Ambulatory Visit (INDEPENDENT_AMBULATORY_CARE_PROVIDER_SITE_OTHER): Payer: Medicare Other

## 2017-06-23 VITALS — BP 176/80 | HR 113 | Temp 98.0°F | Resp 18 | Ht 63.0 in | Wt 158.6 lb

## 2017-06-23 DIAGNOSIS — I10 Essential (primary) hypertension: Secondary | ICD-10-CM

## 2017-06-23 DIAGNOSIS — M25512 Pain in left shoulder: Secondary | ICD-10-CM

## 2017-06-23 DIAGNOSIS — R05 Cough: Secondary | ICD-10-CM

## 2017-06-23 DIAGNOSIS — S29011A Strain of muscle and tendon of front wall of thorax, initial encounter: Secondary | ICD-10-CM | POA: Diagnosis not present

## 2017-06-23 DIAGNOSIS — R059 Cough, unspecified: Secondary | ICD-10-CM

## 2017-06-23 DIAGNOSIS — S46912A Strain of unspecified muscle, fascia and tendon at shoulder and upper arm level, left arm, initial encounter: Secondary | ICD-10-CM

## 2017-06-23 NOTE — Patient Instructions (Addendum)
For the next 2-3 days, take 1000mg  Tylenol (acetaminophen) every 8 hours for your pain and inflammation. Thereafter, take 500mg  Tylenol every 6 hours for pain. Check back with your PCP about your blood pressure medications and your cough.     Shoulder Pain Many things can cause shoulder pain, including:  An injury to the area.  Overuse of the shoulder.  Arthritis.  The source of the pain can be:  Inflammation.  An injury to the shoulder joint.  An injury to a tendon, ligament, or bone.  Follow these instructions at home: Take these actions to help with your pain:  Squeeze a soft ball or a foam pad as much as possible. This helps to keep the shoulder from swelling. It also helps to strengthen the arm.  Take over-the-counter and prescription medicines only as told by your health care provider.  If directed, apply ice to the area: ? Put ice in a plastic bag. ? Place a towel between your skin and the bag. ? Leave the ice on for 20 minutes, 2-3 times per day. Stop applying ice if it does not help with the pain.  If you were given a shoulder sling or immobilizer: ? Wear it as told. ? Remove it to shower or bathe. ? Move your arm as little as possible, but keep your hand moving to prevent swelling.  Contact a health care provider if:  Your pain gets worse.  Your pain is not relieved with medicines.  New pain develops in your arm, hand, or fingers. Get help right away if:  Your arm, hand, or fingers: ? Tingle. ? Become numb. ? Become swollen. ? Become painful. ? Turn white or blue. This information is not intended to replace advice given to you by your health care provider. Make sure you discuss any questions you have with your health care provider. Document Released: 04/15/2005 Document Revised: 03/01/2016 Document Reviewed: 10/29/2014 Elsevier Interactive Patient Education  2017 [employer].     IF you received an x-ray today, you will receive an invoice from  [facility]. Please contact [facility] at [phone] with questions or concerns regarding your invoice.   IF you received labwork today, you will receive an invoice from [facility]. Please contact [facility] at [phone] with questions or concerns regarding your invoice.   Our billing staff will not be able to assist you with questions regarding bills from these companies.  You will be contacted with the lab results as soon as they are available. The fastest way to get your results is to activate your My Chart account. Instructions are located on the last page of this paperwork. If you have not heard from [place] regarding the results in 2 weeks, please contact this office.

## 2017-06-23 NOTE — Progress Notes (Signed)
  MRN: [id number] DOB: 1951-05-25  Subjective:   Rachel White is a 66 y.o. female presenting for left shoulder pain, left sided chest pain, flank pain. Pain is sharp and severe, worsened with extending her left arm upwards, laying on her left side, lifting heavy items. Denies falls, trauma, swelling, bruising, bony deformity, shob. Symptoms started shortly after an office visit with her [facility] PCP. Patient was last seen for this at [facility], had imaging done for her left ribs which had negative results. She was prescribed hydrocodone and Flexeril which have not helped. She has a history of gastric bypass, cannot take NSAIDs. Denies alcohol use.  Rachel White has a current medication list which includes the following prescription(s): aripiprazole, atorvastatin, bifidobacterium infantis, cyclobenzaprine, gabapentin, hydrocodone-acetaminophen, lisinopril, and viactiv multi-vitamin. Also is allergic to sulfa antibiotics and zolpidem tartrate.  Rachel White  has a past medical history of Complication of anesthesia, Diabetes mellitus without complication ([place]), Headache, Hypertension, Irritable bowel syndrome, Neuropathic pain, Pneumonia, and Sleep apnea. Also  has a past surgical history that includes Gastric bypass; Foot surgery (Left); Dilation and curettage of uterus; Tubal ligation; ORIF tibia plateau (Left, 10/19/2014); Colonoscopy (N/A, 10/29/2015); and TEMPORARY PACEMAKER (N/A, 05/19/2017).  Objective:   Vitals: BP (!) 176/80   Pulse (!) 113   Temp 98 F (36.7 C) (Oral)   Resp 18   Ht 5\' 3"  (1.6 m)   Wt 158 lb 9.6 oz (71.9 kg)   SpO2 96%   BMI 28.09 kg/m   Physical Exam  Constitutional: She is oriented to person, place, and time. She appears well-developed and well-nourished.  Cardiovascular: Normal rate, regular rhythm and intact distal pulses. Exam reveals no gallop and no friction rub.  No murmur heard. Pulmonary/Chest: No respiratory distress. She has no wheezes. She has no  rales.  Musculoskeletal:       Left shoulder: She exhibits decreased range of motion (abduction greater than 90 degrees) and tenderness (upper left latera ribs with ROM testing of left shoulder). She exhibits no bony tenderness, no swelling, no effusion, no crepitus, no deformity, no laceration, no pain, no spasm, normal pulse and normal strength.  Neurological: She is alert and oriented to person, place, and time.   Dg Shoulder Left  Result Date: 06/23/2017 CLINICAL DATA:  Acute left shoulder pain. EXAM: LEFT SHOULDER - 2+ VIEW COMPARISON:  None. FINDINGS: There is no evidence of fracture or dislocation. There is no evidence of arthropathy or other focal bone abnormality. Soft tissues are unremarkable. IMPRESSION: Normal left shoulder. Electronically Signed   By: Marijo Conception, M.D.   On: 06/23/2017 10:05   Assessment and Plan :   1. Acute pain of left shoulder 2. Shoulder strain, left, initial encounter 3. Chest wall muscle strain, initial encounter - Imaging is negative, physical exam findings reassuring. Schedule APAP. Return-to-clinic precautions discussed, patient verbalized understanding.   4. Cough 5. Essential hypertension - Patient is working closely with her PCP, just had an OV with them last week and had labs and medication changes done. I recommended she f/u with them and set up a f/u appt asap.  Jaynee Eagles, PA-C [facility] [phone] 06/23/2017  9:46 AM

## 2017-07-26 ENCOUNTER — Encounter (HOSPITAL_COMMUNITY): Payer: Self-pay | Admitting: Psychiatry

## 2017-07-26 ENCOUNTER — Ambulatory Visit (INDEPENDENT_AMBULATORY_CARE_PROVIDER_SITE_OTHER): Payer: Medicare Other | Admitting: Psychiatry

## 2017-07-26 VITALS — BP 142/88 | HR 81 | Ht 63.0 in | Wt 156.6 lb

## 2017-07-26 DIAGNOSIS — T464X2A Poisoning by angiotensin-converting-enzyme inhibitors, intentional self-harm, initial encounter: Secondary | ICD-10-CM

## 2017-07-26 DIAGNOSIS — F331 Major depressive disorder, recurrent, moderate: Secondary | ICD-10-CM | POA: Diagnosis not present

## 2017-07-26 DIAGNOSIS — F419 Anxiety disorder, unspecified: Secondary | ICD-10-CM

## 2017-07-26 DIAGNOSIS — Z634 Disappearance and death of family member: Secondary | ICD-10-CM | POA: Diagnosis not present

## 2017-07-26 DIAGNOSIS — T1491XA Suicide attempt, initial encounter: Secondary | ICD-10-CM

## 2017-07-26 DIAGNOSIS — F17210 Nicotine dependence, cigarettes, uncomplicated: Secondary | ICD-10-CM

## 2017-07-26 DIAGNOSIS — F4310 Post-traumatic stress disorder, unspecified: Secondary | ICD-10-CM

## 2017-07-26 DIAGNOSIS — Z62810 Personal history of physical and sexual abuse in childhood: Secondary | ICD-10-CM

## 2017-07-26 MED ORDER — FLUOXETINE HCL 10 MG PO CAPS: ORAL_CAPSULE | ORAL | 0 refills | Status: DC

## 2017-07-26 MED ORDER — ARIPIPRAZOLE 5 MG PO TABS: ORAL_TABLET | ORAL | 0 refills | Status: DC
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
## 2017-08-19 ENCOUNTER — Encounter (HOSPITAL_COMMUNITY): Payer: Self-pay | Admitting: Psychiatry

## 2017-08-19 ENCOUNTER — Ambulatory Visit (INDEPENDENT_AMBULATORY_CARE_PROVIDER_SITE_OTHER): Payer: Medicare Other | Admitting: Psychiatry

## 2017-08-19 DIAGNOSIS — Z79899 Other long term (current) drug therapy: Secondary | ICD-10-CM | POA: Diagnosis not present

## 2017-08-19 DIAGNOSIS — M1990 Unspecified osteoarthritis, unspecified site: Secondary | ICD-10-CM | POA: Diagnosis not present

## 2017-08-19 DIAGNOSIS — Z634 Disappearance and death of family member: Secondary | ICD-10-CM | POA: Diagnosis not present

## 2017-08-19 DIAGNOSIS — Z87891 Personal history of nicotine dependence: Secondary | ICD-10-CM

## 2017-08-19 DIAGNOSIS — R251 Tremor, unspecified: Secondary | ICD-10-CM

## 2017-08-19 DIAGNOSIS — E1140 Type 2 diabetes mellitus with diabetic neuropathy, unspecified: Secondary | ICD-10-CM | POA: Diagnosis not present

## 2017-08-19 DIAGNOSIS — G4733 Obstructive sleep apnea (adult) (pediatric): Secondary | ICD-10-CM | POA: Diagnosis not present

## 2017-08-19 DIAGNOSIS — I10 Essential (primary) hypertension: Secondary | ICD-10-CM

## 2017-08-19 DIAGNOSIS — F331 Major depressive disorder, recurrent, moderate: Secondary | ICD-10-CM

## 2017-08-19 MED ORDER — FLUOXETINE HCL 10 MG PO CAPS: ORAL_CAPSULE | ORAL | 0 refills | Status: DC

## 2017-08-19 NOTE — Progress Notes (Signed)
BH MD/PA/NP OP Progress Note  08/19/2017 2:35 PM Rachel White  MRN:  [id number]  Chief Complaint: I am doing better on Prozac.  I stop the Abilify but I still have tremors and shakes.  HPI: Rachel White is 67 year old Caucasian married retired female who was seen 3 weeks ago as initial evaluation.  She was referred from [facility] where she was admitted after taking overdose on her blood pressure and required ICU.  She was discharged on Abilify but she was concerned about the tremors shakes and quite difficult to afford.  We recommended to try Prozac.  She is taking 20 mg.  She is seen improvement in her depression and anxiety.  Her crying spells are less frequent.  She is now comfortable talking about her deceased brother with the family members.  She has tremors in her hand but she has no more twitching.  She feels Prozac working and she is tolerating better than the Abilify.  Patient is not interested in grief counseling.  She enjoys babysitting her grand daughter who is 64 years old.  Patient has 7 grandchildren and 2 great grandkids.  Her husband is very supportive.  Patient has neuropathy hypertension arthritis and sleep apnea.  She was diagnosed diabetes which is well controlled on the diet.  Patient denies drinking alcohol or using any illegal substances.  Her energy level is improved.  She is more social and active.  She does not feel she need therapy.  She had stopped taking Abilify 10 days ago.  She lost weight and happy about it.  Patient denies any illegal substance use.  Visit Diagnosis:    ICD-10-CM   1. Moderate episode of recurrent major depressive disorder ([name]) F33.1 FLUoxetine (PROZAC) 10 MG capsule   Past Psychiatric History:  Patient was admitted at [facility] in November 2018 after taking overdose on her blood pressure medication.  She required ICU.  She was going through severe grief after the death of her brother in 2023-02-20.  Patient denies any  psychosis, hallucination, paranoia or any mania. Past Medical History:  Past Medical History:  Diagnosis Date  . Complication of anesthesia    some difficulty waking up after tubes tied  . Diabetes mellitus without complication ([name])    diet controlled  . Headache    migraines years ago  . Hypertension   . Irritable bowel syndrome    under control  . Neuropathic pain   . Pneumonia    as a child  . Sleep apnea    does not use C_pap    Past Surgical History:  Procedure Laterality Date  . COLONOSCOPY N/A 10/29/2015   Procedure: COLONOSCOPY;  Surgeon: Clarene Essex, MD;  [facility];  Service: Endoscopy;  Laterality: N/A;  . DILATION AND CURETTAGE OF UTERUS    . FOOT SURGERY Left   . GASTRIC BYPASS    . ORIF TIBIA PLATEAU Left 10/19/2014   Procedure: OPEN REDUCTION INTERNAL FIXATION (ORIF) LEFT BICONDYLAR TIBIAL PLATEAU;  Surgeon: Leandrew Koyanagi, MD;  Location: [place];  Service: Orthopedics;  Laterality: Left;  . TEMPORARY PACEMAKER N/A 05/19/2017   Procedure: TEMPORARY PACEMAKER;  Surgeon: Leonie Man, MD;  Location: [place] [facility];  Service: Cardiovascular;  Laterality: N/A;  . TUBAL LIGATION      Family Psychiatric History: Reviewed.  Family History:  Family History  Problem Relation Age of Onset  . Cancer Mother   . Cancer Other   . COPD Other   .  Diabetes type II Neg Hx     Social History:  Social History   Socioeconomic History  . Marital status: Married    Spouse name: Not on file  . Number of children: Not on file  . Years of education: Not on file  . Highest education level: Not on file  Social Needs  . Financial resource strain: Not on file  . Food insecurity - worry: Not on file  . Food insecurity - inability: Not on file  . Transportation needs - medical: Not on file  . Transportation needs - non-medical: Not on file  Occupational History  . Not on file  Tobacco Use  . Smoking status: Former Smoker    Packs/day: 1.00    Years: 41.00     Pack years: 41.00    Types: Cigarettes    Last attempt to quit: 02/23/2017    Years since quitting: 0.4  . Smokeless tobacco: Never Used  . Tobacco comment: quit 3 months ago  Substance and Sexual Activity  . Alcohol use: No    Alcohol/week: 0.0 oz  . Drug use: No  . Sexual activity: No    Birth control/protection: None  Other Topics Concern  . Not on file  Social History Narrative  . Not on file    Allergies:  Allergies  Allergen Reactions  . Sulfa Antibiotics Other (See Comments)    Pt. Does not recall the reaction   . Zolpidem Tartrate Other (See Comments)    Altered mental status     Metabolic Disorder Labs: Lab Results  Component Value Date   HGBA1C 6.0 (H) 05/26/2017   MPG 125.5 05/26/2017   MPG 117 10/27/2015   No results found for: PROLACTIN Lab Results  Component Value Date   CHOL 154 05/26/2017   TRIG 225 (H) 05/26/2017   HDL 25 (L) 05/26/2017   CHOLHDL 6.2 05/26/2017   VLDL 45 (H) 05/26/2017   LDLCALC 84 05/26/2017   LDLCALC 136 (H) 08/01/2012   Lab Results  Component Value Date   TSH 2.771 05/26/2017   TSH 0.337 (L) 10/27/2015    Therapeutic Level Labs: No results found for: LITHIUM No results found for: VALPROATE No components found for:  CBMZ  Current Medications: Current Outpatient Medications  Medication Sig Dispense Refill  . ARIPiprazole (ABILIFY) 5 MG tablet Take 1 tab daily for 1-2 week 15 tablet 0  . atorvastatin (LIPITOR) 10 MG tablet Take 10 mg by mouth every evening.    . bifidobacterium infantis (ALIGN) capsule Take 1 capsule by mouth daily.    . calcium carbonate (OS-CAL - DOSED IN MG OF ELEMENTAL CALCIUM) 1250 (500 Ca) MG tablet Take by mouth.    . cyclobenzaprine (FLEXERIL) 10 MG tablet Take 1 tablet (10 mg total) by mouth 2 (two) times daily as needed for muscle spasms. (Patient not taking: Reported on 07/26/2017) 20 tablet 0  . FLUoxetine (PROZAC) 10 MG capsule Take 1 capsule daily for 1 week and than 2 capsule daily 60  capsule 0  . gabapentin (NEURONTIN) 400 MG capsule Take 2 capsules (800 mg total) 2 (two) times daily by mouth. 120 capsule 0  . lisinopril (PRINIVIL,ZESTRIL) 10 MG tablet Take 10 mg by mouth every morning.    . Melatonin 10 MG TABS Take by mouth.    . Multiple Vitamins-Calcium (VIACTIV MULTI-VITAMIN) CHEW Chew 1 each by mouth daily.    Marland Kitchen tiZANidine (ZANAFLEX) 4 MG tablet Take 4 mg by mouth every 6 (six) hours as needed for  muscle spasms.    . traZODone (DESYREL) 50 MG tablet Take 1-2 tablets at bedtime for sleep.     No current facility-administered medications for this visit.      Musculoskeletal: Strength & Muscle Tone: within normal limits Gait & Station: normal Patient leans: N/A  Psychiatric Specialty Exam: ROS  There were no vitals taken for this visit.There is no height or weight on file to calculate BMI.  General Appearance: Casual  Eye Contact:  Fair  Speech:  Slow  Volume:  Decreased  Mood:  Anxious and Dysphoric  Affect:  Constricted  Thought Process:  Goal Directed  Orientation:  Full (Time, Place, and Person)  Thought Content: Rumination   Suicidal Thoughts:  No  Homicidal Thoughts:  No  Memory:  Immediate;   Good Recent;   Good Remote;   Good  Judgement:  Good  Insight:  Good  Psychomotor Activity:  Decreased and Tremors in both hands.  Concentration:  Concentration: Fair and Attention Span: Fair  Recall:  Good  Fund of Knowledge: Good  Language: Good  Akathisia:  No  Handed:  Right  AIMS (if indicated): not done  Assets:  Communication Skills Desire for Improvement Housing Resilience Social Support  ADL's:  Intact  Cognition: WNL  Sleep:  Good   Screenings: AUDIT     Admission (Discharged) from 05/24/2017 in [place]  Alcohol Use Disorder Identification Test Final Score (AUDIT)  0    PHQ2-9     Office Visit from 06/23/2017 in Primary Care at [place] from 03/03/2015 in Primary Care at [facility] Total  Score  0  0       Assessment and Plan: Major depressive disorder, recurrent.  Complicated grief.  Patient doing better on Prozac 20 mg.  She still have residual depression but feeling better than before.  One more time I encourage grief counseling but patient declined.  Recommended to try Prozac 30 mg daily.  She had stopped Abilify.  She lost a few pounds from the last visit.  Her tremors are improved but reassurance given that if Abilify causing these tremors should resolve in few weeks.  If she continued to have tremors then she should be seen neurology.  Discussed medication side effects and benefits.  Continue trazodone 50 mg as needed for insomnia which is prescribed by primary care physician.  Recommended to call us back if is any question or any concern.  Follow-up in 3 months.   Kathlee Nations, MD 08/19/2017, 2:35 PM

## 2017-08-23 ENCOUNTER — Telehealth (HOSPITAL_COMMUNITY): Payer: Self-pay

## 2017-08-23 NOTE — Telephone Encounter (Signed)
She can go back to Prozac 20 mg.

## 2017-08-23 NOTE — Telephone Encounter (Signed)
I called the patient and let her know that she could go back to the 20 mg, patient did not need a new prescription at this time.

## 2017-08-23 NOTE — Telephone Encounter (Signed)
Patient is calling to let you know that since increasing her Prozac to 30 mg her tremors have gotten worse. Patient would like to go back to the 20 mg if possible, please review and advise, thank you

## 2017-10-12 DIAGNOSIS — R251 Tremor, unspecified: Secondary | ICD-10-CM | POA: Insufficient documentation

## 2017-11-16 ENCOUNTER — Ambulatory Visit (HOSPITAL_COMMUNITY): Payer: Medicare Other | Admitting: Psychiatry

## 2017-11-20 ENCOUNTER — Encounter (HOSPITAL_COMMUNITY): Payer: Self-pay | Admitting: Psychiatry

## 2017-11-20 ENCOUNTER — Ambulatory Visit (INDEPENDENT_AMBULATORY_CARE_PROVIDER_SITE_OTHER): Payer: Medicare Other | Admitting: Psychiatry

## 2017-11-20 DIAGNOSIS — Z87891 Personal history of nicotine dependence: Secondary | ICD-10-CM

## 2017-11-20 DIAGNOSIS — F331 Major depressive disorder, recurrent, moderate: Secondary | ICD-10-CM

## 2017-11-20 DIAGNOSIS — R251 Tremor, unspecified: Secondary | ICD-10-CM

## 2017-11-20 MED ORDER — FLUOXETINE HCL 20 MG PO CAPS: 20.0000 mg | ORAL_CAPSULE | Freq: Every day | ORAL | 0 refills | Status: DC

## 2017-11-20 NOTE — Progress Notes (Signed)
BH MD/PA/NP OP Progress Note  11/20/2017 8:49 AM Rachel White  MRN:  [id number]  Chief Complaint: I am doing better since I cut down the Prozac.  I still have tremors but not as bad.  HPI: Rachel White came for her follow-up appointment.  On her last visit we increase Prozac to 30 mg but her tremor got worse and she is back on 20 mg.  She still have mild tremors in her hand.  She is scheduled to see neurologist and Hall Busing in August.  Patient does not drive because of the tremors.  Her husband takes her to the doctor's appointment.  She feels her depression is much better.  She denies any crying spells.  She is able to talk about her deceased brother with the family members.  Her mother's death anniversary coming in 02-Mar-2023 and she believe she could handle the anniversary without any issues.  She is sleeping good.  She denies any feeling of hopelessness or worthlessness.  She also cut down her gabapentin for back pain.  Patient has multiple health issues including neuropathy, hypertension, arthritis and sleep apnea.  She admitted weight gain from the past but now she is focus on healthy lifestyle and watching her calorie intake.  Patient denies any paranoia, hallucination, suicidal thoughts or homicidal thought.  She denies drinking alcohol or using any illegal substances.  She lives with her husband who is very supportive.  She recently seen her primary care physician Rachel White and she had blood work.  Visit Diagnosis:    ICD-10-CM   1. Moderate episode of recurrent major depressive disorder ([name]) F33.1 FLUoxetine (PROZAC) 20 MG capsule    Past Psychiatric History: Reviewed. Patient was admitted at [facility] in November 2018 after taking overdose on her blood pressure medication.  She required ICU treatment.  She was discharged on Abilify but it was switched to Prozac because of tremors.  We try to increase the Prozac but she could not tolerate.  Patient denies any history of  psychosis, hallucination, paranoia or any mania.  Past Medical History:  Past Medical History:  Diagnosis Date  . Complication of anesthesia    some difficulty waking up after tubes tied  . Diabetes mellitus without complication ([name])    diet controlled  . Headache    migraines years ago  . Hypertension   . Irritable bowel syndrome    under control  . Neuropathic pain   . Pneumonia    as a child  . Sleep apnea    does not use C_pap    Past Surgical History:  Procedure Laterality Date  . COLONOSCOPY N/A 10/29/2015   Procedure: COLONOSCOPY;  Surgeon: Clarene Essex, MD;  Location: [facility];  Service: Endoscopy;  Laterality: N/A;  . DILATION AND CURETTAGE OF UTERUS    . FOOT SURGERY Left   . GASTRIC BYPASS    . ORIF TIBIA PLATEAU Left 10/19/2014   Procedure: OPEN REDUCTION INTERNAL FIXATION (ORIF) LEFT BICONDYLAR TIBIAL PLATEAU;  Surgeon: Leandrew Koyanagi, MD;  Location: [place];  Service: Orthopedics;  Laterality: Left;  . TEMPORARY PACEMAKER N/A 05/19/2017   Procedure: TEMPORARY PACEMAKER;  Surgeon: Leonie Man, MD;  Location: [place] [facility];  Service: Cardiovascular;  Laterality: N/A;  . TUBAL LIGATION      Family Psychiatric History: Viewed  Family History:  Family History  Problem Relation Age of Onset  . Cancer Mother   . Cancer Other   . COPD Other   .  Diabetes type II Neg Hx     Social History:  Social History   Socioeconomic History  . Marital status: Married    Spouse name: Not on file  . Number of children: Not on file  . Years of education: Not on file  . Highest education level: Not on file  Occupational History  . Not on file  Social Needs  . Financial resource strain: Not on file  . Food insecurity:    Worry: Not on file    Inability: Not on file  . Transportation needs:    Medical: Not on file    Non-medical: Not on file  Tobacco Use  . Smoking status: Former Smoker    Packs/day: 1.00    Years: 41.00    Pack years: 41.00    Types:  Cigarettes    Last attempt to quit: 02/23/2017    Years since quitting: 0.7  . Smokeless tobacco: Never Used  . Tobacco comment: quit 3 months ago  Substance and Sexual Activity  . Alcohol use: No    Alcohol/week: 0.0 oz  . Drug use: No  . Sexual activity: Never    Birth control/protection: None  Lifestyle  . Physical activity:    Days per week: Not on file    Minutes per session: Not on file  . Stress: Not on file  Relationships  . Social connections:    Talks on phone: Not on file    Gets together: Not on file    Attends religious service: Not on file    Active member of club or organization: Not on file    Attends meetings of clubs or organizations: Not on file    Relationship status: Not on file  Other Topics Concern  . Not on file  Social History Narrative  . Not on file    Allergies:  Allergies  Allergen Reactions  . Sulfa Antibiotics Other (See Comments)    Pt. Does not recall the reaction   . Zolpidem Tartrate Other (See Comments)    Altered mental status     Metabolic Disorder Labs: Lab Results  Component Value Date   HGBA1C 6.0 (H) 05/26/2017   MPG 125.5 05/26/2017   MPG 117 10/27/2015   No results found for: PROLACTIN Lab Results  Component Value Date   CHOL 154 05/26/2017   TRIG 225 (H) 05/26/2017   HDL 25 (L) 05/26/2017   CHOLHDL 6.2 05/26/2017   VLDL 45 (H) 05/26/2017   LDLCALC 84 05/26/2017   LDLCALC 136 (H) 08/01/2012   Lab Results  Component Value Date   TSH 2.771 05/26/2017   TSH 0.337 (L) 10/27/2015    Therapeutic Level Labs: No results found for: LITHIUM No results found for: VALPROATE No components found for:  CBMZ  Current Medications: Current Outpatient Medications  Medication Sig Dispense Refill  . atorvastatin (LIPITOR) 10 MG tablet Take 10 mg by mouth every evening.    . bifidobacterium infantis (ALIGN) capsule Take 1 capsule by mouth daily.    . calcium carbonate (OS-CAL - DOSED IN MG OF ELEMENTAL CALCIUM) 1250 (500 Ca)  MG tablet Take by mouth.    . cyclobenzaprine (FLEXERIL) 10 MG tablet Take 1 tablet (10 mg total) by mouth 2 (two) times daily as needed for muscle spasms. 20 tablet 0  . FLUoxetine (PROZAC) 10 MG capsule Take  3 capsule daily 270 capsule 0  . gabapentin (NEURONTIN) 400 MG capsule Take 2 capsules (800 mg total) 2 (two) times daily by mouth.  120 capsule 0  . lisinopril (PRINIVIL,ZESTRIL) 10 MG tablet Take 10 mg by mouth every morning.    Marland Kitchen losartan (COZAAR) 25 MG tablet Take 2 tablets daily    . Melatonin 10 MG TABS Take by mouth.    . Multiple Vitamins-Calcium (VIACTIV MULTI-VITAMIN) CHEW Chew 1 each by mouth daily.    Marland Kitchen tiZANidine (ZANAFLEX) 2 MG tablet Take by mouth.    . traZODone (DESYREL) 50 MG tablet Take 1-2 tablets at bedtime for sleep.     No current facility-administered medications for this visit.      Musculoskeletal: Strength & Muscle Tone: within normal limits Gait & Station: normal Patient leans: N/A  Psychiatric Specialty Exam: ROS  Blood pressure (!) 176/79, pulse (!) 55, temperature (!) 97.5 F (36.4 C), temperature source Oral, weight 158 lb 9.6 oz (71.9 kg).Body mass index is 28.09 kg/m.  General Appearance: Casual  Eye Contact:  Good  Speech:  Clear and Coherent  Volume:  Normal  Mood:  Euthymic  Affect:  Appropriate  Thought Process:  Goal Directed  Orientation:  Full (Time, Place, and Person)  Thought Content: Rumination   Suicidal Thoughts:  No  Homicidal Thoughts:  No  Memory:  Immediate;   Good Recent;   Good Remote;   Good  Judgement:  Good  Insight:  Good  Psychomotor Activity:  Mild tremors in her hand  Concentration:  Concentration: Fair and Attention Span: Fair  Recall:  Good  Fund of Knowledge: Good  Language: Good  Akathisia:  No  Handed:  Right  AIMS (if indicated): not done  Assets:  Communication Skills Desire for Improvement Housing Social Support  ADL's:  Intact  Cognition: WNL  Sleep:  Good   Screenings: AUDIT      Admission (Discharged) from 05/24/2017 in [place]  Alcohol Use Disorder Identification Test Final Score (AUDIT)  0    PHQ2-9     Office Visit from 06/23/2017 in Primary Care at [place] from 03/03/2015 in Primary Care at [facility] Total Score  0  0       Assessment and Plan: Major depressive disorder, recurrent.  I reviewed blood work results from her primary care physician.  She has normal chemistry.  She cut down her gabapentin and only taking 400 mg 1 to 2 tablet as needed prescribed by primary care physician.  She has mild tremors which got better since she reduce the Prozac dose.  Patient like to get her Prozac from her primary care physician and she is going to talk to her because she want to get all the prescription from one provider.  I agree with the plan.  Patient also scheduled to see neurology in August for tremors.  She is not interested in counseling.  I will continue Prozac 20 mg daily and trazodone 50 mg at bedtime.  Discussed medication side effects and benefits.  I will see her again in 3 months unless her primary care physician agreed to provide psychotropic medication.  Recommended to call us back if she has any question or any concern.  Discussed weight gain and recommended healthy lifestyle and watch her calorie intake.   Kathlee Nations, MD 11/20/2017, 8:49 AM

## 2017-12-06 IMAGING — CT CT ABD-PELV W/ CM
2 of 5 series · 16 of 46 positions shown, 18 images · IV contrast (APPLIED)
Comparison: None.

CLINICAL DATA: History of gastric bypass 10 years ago. Diarrhea for
1 month.

EXAM:
CT ABDOMEN AND PELVIS WITH CONTRAST
TECHNIQUE: Multidetector CT imaging of the abdomen and pelvis was performed
using the standard protocol following bolus administration of
intravenous contrast.
CONTRAST:  100mL GNVHPP-ZJJ IOPAMIDOL (GNVHPP-ZJJ) INJECTION 61%

[Series 2: abd/pelvis w/cm · axial · 0.73mm/px · z∈[-664,-254]mm · 13 of 92 slices shown, 15 images]
[im 5/92  soft-tissue]
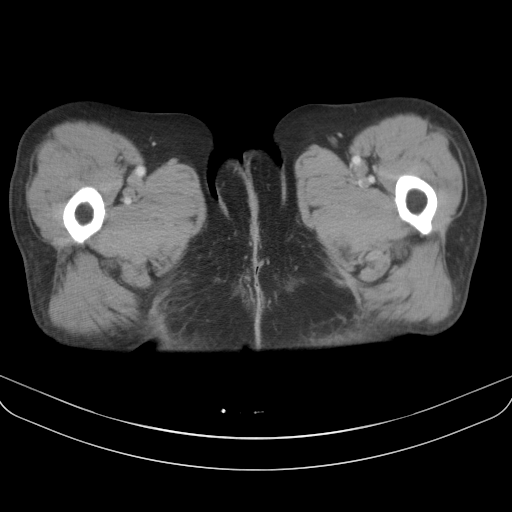
[im 5/92  bone]
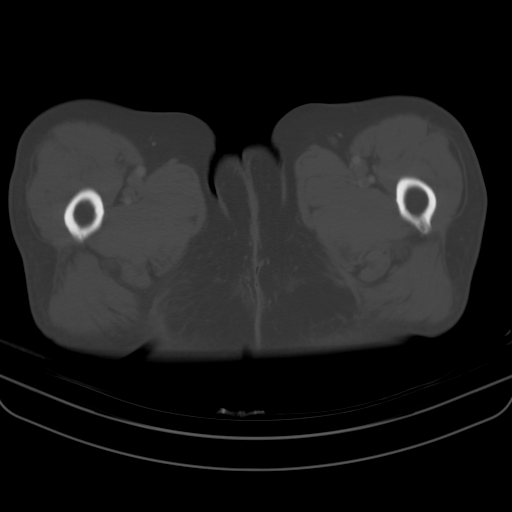
[im 15/92  soft-tissue]
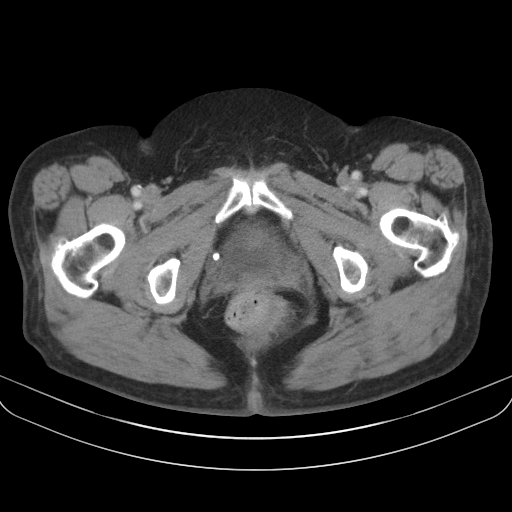
[im 20/92  soft-tissue]
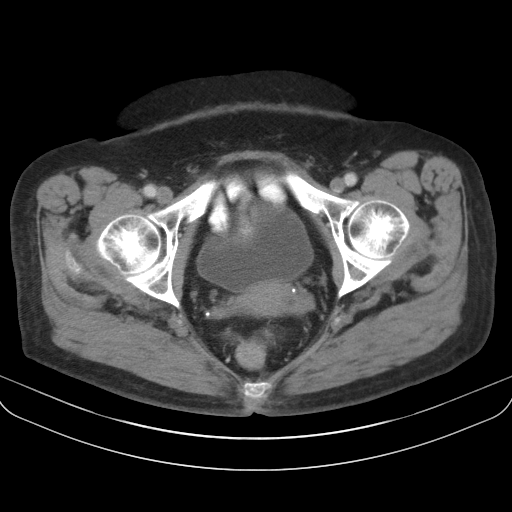
[im 24/92  soft-tissue]
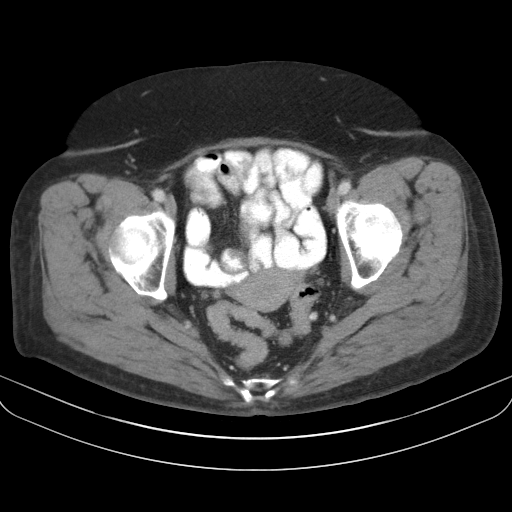
[im 34/92  soft-tissue]
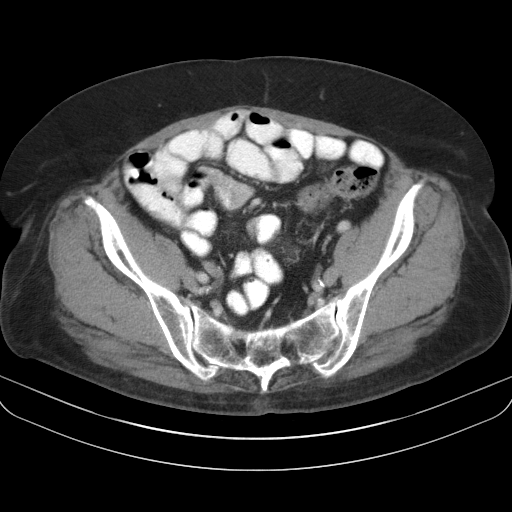
[im 39/92  soft-tissue]
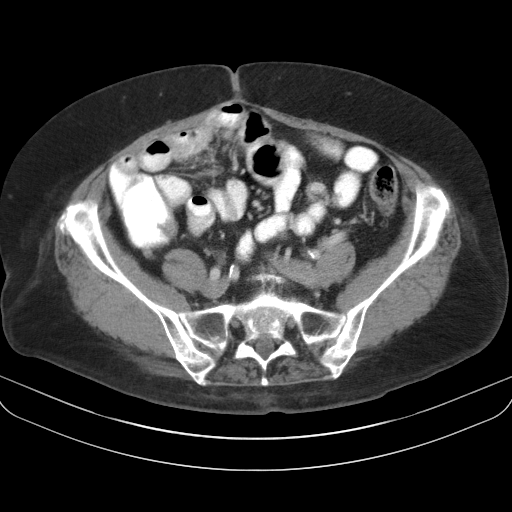
[im 48/92  soft-tissue]
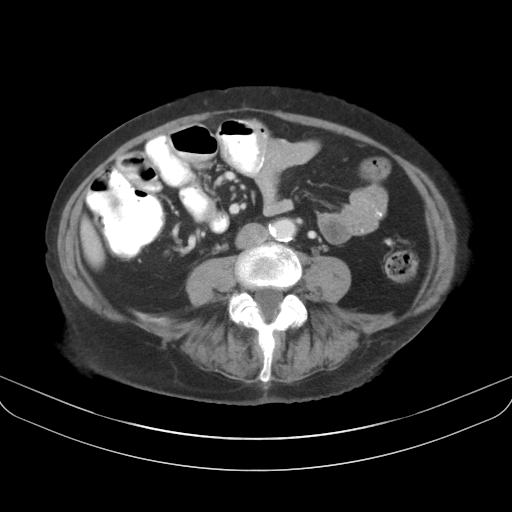
[im 53/92  soft-tissue]
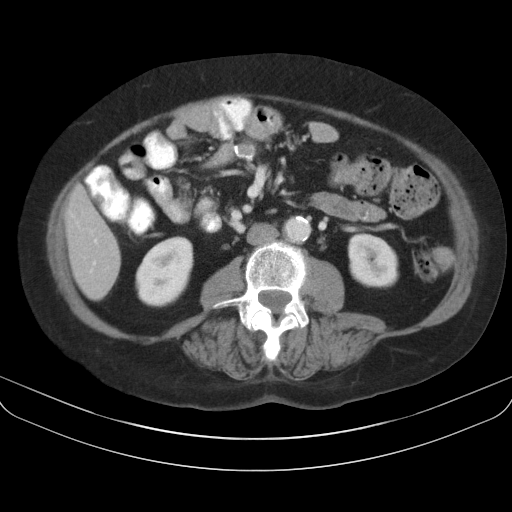
[im 58/92  soft-tissue]
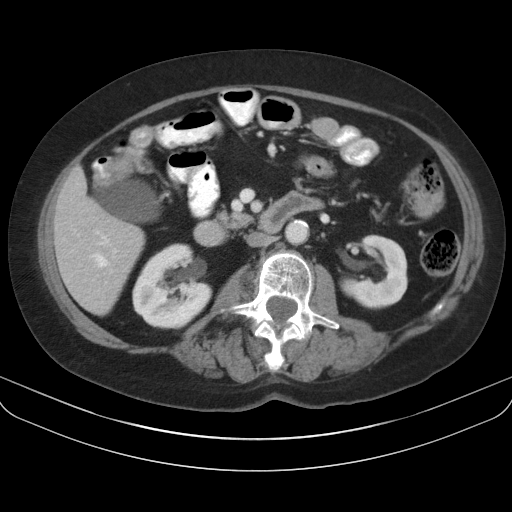
[im 58/92  bone]
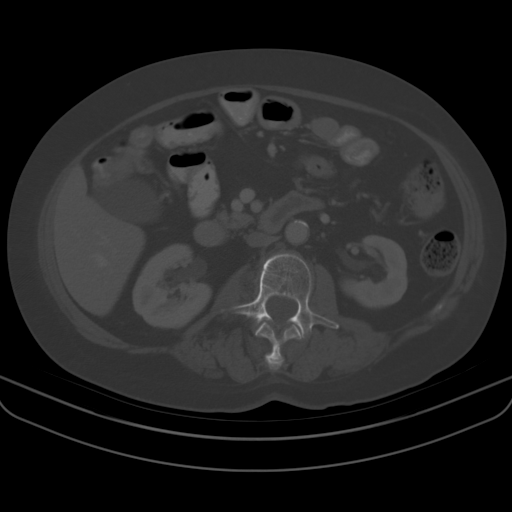
[im 68/92  soft-tissue]
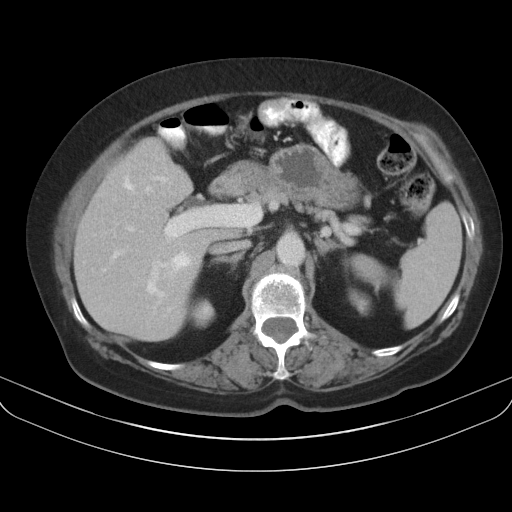
[im 72/92  soft-tissue]
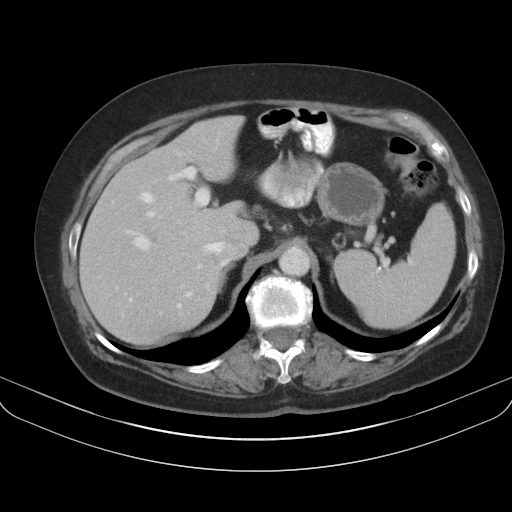
[im 77/92  soft-tissue]
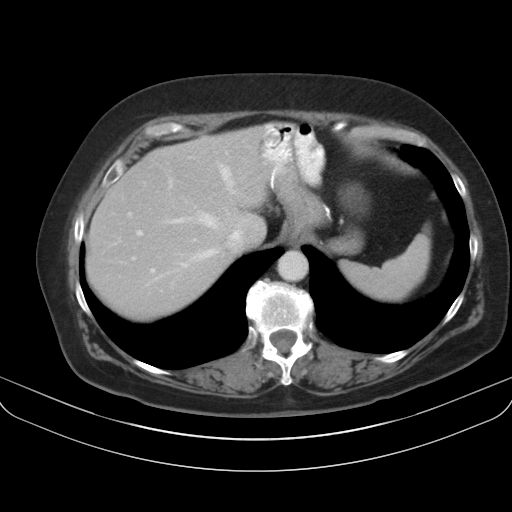
[im 87/92  soft-tissue]
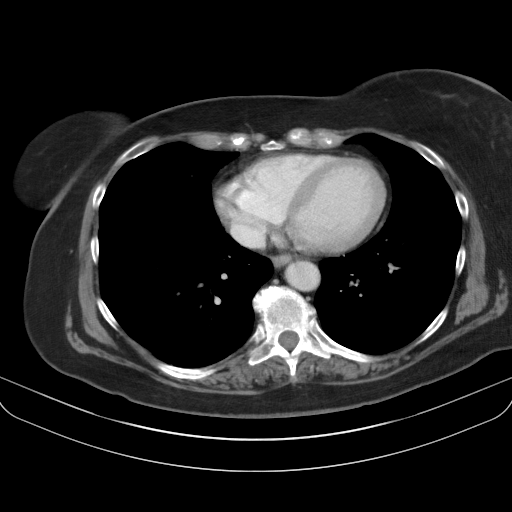

[Series 3: cor · coronal · 0.74mm/px · 3 of 75 slices shown]
[im 25/75  soft-tissue]
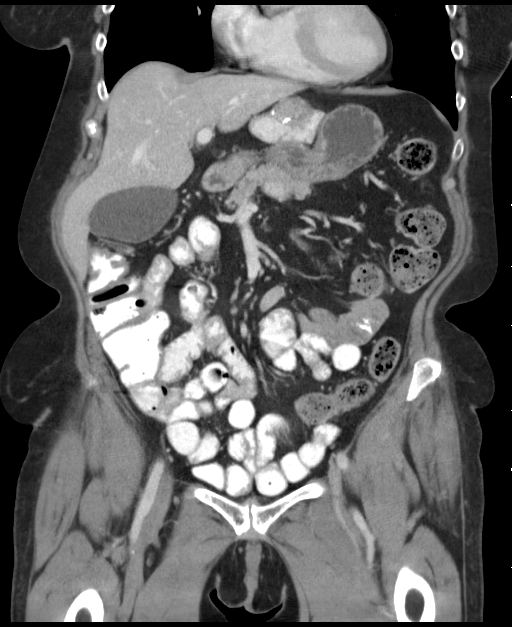
[im 33/75  soft-tissue]
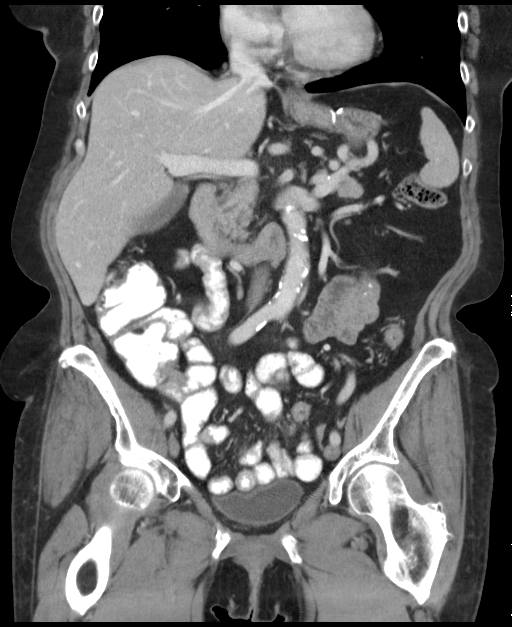
[im 42/75  soft-tissue]
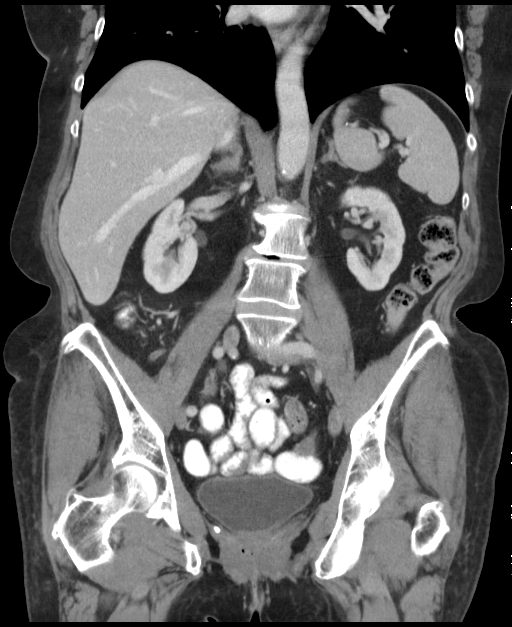

[16 of 46 positions shown; findings below may reference images not displayed]

FINDINGS: Lower chest: No pleural or pericardial effusion identified. The lung
bases appear clear.

Hepatobiliary: No focal liver abnormality. The gallbladder appears
normal. No biliary dilatation.

Pancreas: Normal appearance of the pancreas.

Spleen: The spleen is unremarkable.

Adrenals/Urinary Tract: The adrenal glands are normal. Normal
appearance of the kidneys. No mass or hydronephrosis identified. The
urinary bladder is normal.

Stomach/Bowel: Postoperative changes of gastric bypass surgery are
identified involving the stomach. No pathologic dilatation of the
small bowel loops. Enteric contrast material is identified within
the small bowel loops up to the level of the proximal transverse
colon. No abnormal bowel wall thickening or inflammation identified.

Vascular/Lymphatic: Calcified atherosclerotic disease involves the
abdominal aorta. No aneurysm. No enlarged retroperitoneal or
mesenteric adenopathy. No enlarged pelvic or inguinal lymph nodes.

Reproductive: Uterus and bilateral adnexa are unremarkable.

Other: No abdominal wall hernia or abnormality. No abdominopelvic
ascites.

Musculoskeletal: There is antero listhesis of L5 on S1 which now
appear fused. Degenerative disc disease is noted at the L3-4 level.
No fractures or subluxations identified.
IMPRESSION: 1. No acute findings identified within the abdomen or pelvis.
2. Postoperative appearance of the upper GI tract compatible with
previous gastric bypass surgery.
3. Aortic atherosclerosis.
4. Lumbar spondylosis.

## 2018-02-12 ENCOUNTER — Ambulatory Visit (HOSPITAL_COMMUNITY): Payer: Self-pay | Admitting: Psychiatry

## 2018-03-10 NOTE — Progress Notes (Addendum)
BH MD/PA/NP OP Progress Note  03/12/2018 11:04 AM Rachel White  MRN:  [id number]  Chief Complaint:  Chief Complaint    Depression; Follow-up     HPI:  Rachel White is a 67 y.o. year old female with a history of depression, hypertension, diet controlled DM,  OSA not on CPAP, IBS-diarrhea type, who presents for follow up appointment for depression. She is a patient of Dr. Adele Schilder. Per chart review, she was admitted to [facility] after overdosed CCB as a suicide attempt, which required ICU admission in 05/2017.   Patient states that she has been "doing great" since the last appointment. She believes that getting back to her routine, and "learned to relax" helps her tremendously.  She does yard work, house chores, which includes cooking. She has good relationship with her husband of 38 years. She visits her 66.8 year old granddaughter, who lives close to her place. She states that she did not realize how she was depressed when she lost her brother last year. She was with him 24 hours around that time. Although she could not even talk about her brother last year, it does not bother her anymore, stating that she has "great memories." She wants to be off fluoxetine. She understands to stay on medication if it is recommended.  She has been adjusting to her body. Although it bothers her that she has been unable to do things as in younger age, she has been able to enjoy things. She sleeps better, 8 hours.  She feels occasionally fatigued, which she attributes to her hip pain. She has fair concentration.  She denies SI.  She denies anxiety.   Visit Diagnosis:    ICD-10-CM   1. Recurrent major depressive disorder, in partial remission ([place]) F33.41   2. Moderate episode of recurrent major depressive disorder ([name]) F33.1 FLUoxetine (PROZAC) 20 MG capsule    Past Psychiatric History: Please see initial evaluation for full details. I have reviewed the history. No updates at this time.     Past Medical  History:  Past Medical History:  Diagnosis Date  . Complication of anesthesia    some difficulty waking up after tubes tied  . Diabetes mellitus without complication ([name])    diet controlled  . Headache    migraines years ago  . Hypertension   . Irritable bowel syndrome    under control  . Neuropathic pain   . Pneumonia    as a child  . Sleep apnea    does not use C_pap    Past Surgical History:  Procedure Laterality Date  . COLONOSCOPY N/A 10/29/2015   Procedure: COLONOSCOPY;  Surgeon: Clarene Essex, MD;  [facility]: [facility];  Service: Endoscopy;  Laterality: N/A;  . DILATION AND CURETTAGE OF UTERUS    . FOOT SURGERY Left   . GASTRIC BYPASS    . ORIF TIBIA PLATEAU Left 10/19/2014   Procedure: OPEN REDUCTION INTERNAL FIXATION (ORIF) LEFT BICONDYLAR TIBIAL PLATEAU;  Surgeon: Leandrew Koyanagi, MD;  Location: [place];  Service: Orthopedics;  Laterality: Left;  . TEMPORARY PACEMAKER N/A 05/19/2017   Procedure: TEMPORARY PACEMAKER;  Surgeon: Leonie Man, MD;  Location: [place] [facility];  Service: Cardiovascular;  Laterality: N/A;  . TUBAL LIGATION      Family Psychiatric History: Please see initial evaluation for full details. I have reviewed the history. No updates at this time.     Family History:  Family History  Problem Relation Age of Onset  . Cancer Mother   .  Cancer Other   . COPD Other   . Diabetes type II Neg Hx     Social History:  Social History   Socioeconomic History  . Marital status: Married    Spouse name: Not on file  . Number of children: Not on file  . Years of education: Not on file  . Highest education level: Not on file  Occupational History  . Not on file  Social Needs  . Financial resource strain: Not on file  . Food insecurity:    Worry: Not on file    Inability: Not on file  . Transportation needs:    Medical: Not on file    Non-medical: Not on file  Tobacco Use  . Smoking status: Former Smoker    Packs/day: 1.00    Years: 41.00     Pack years: 41.00    Types: Cigarettes    Last attempt to quit: 02/23/2017    Years since quitting: 1.0  . Smokeless tobacco: Never Used  . Tobacco comment: quit 3 months ago  Substance and Sexual Activity  . Alcohol use: No    Alcohol/week: 0.0 standard drinks  . Drug use: No  . Sexual activity: Never    Birth control/protection: None  Lifestyle  . Physical activity:    Days per week: Not on file    Minutes per session: Not on file  . Stress: Not on file  Relationships  . Social connections:    Talks on phone: Not on file    Gets together: Not on file    Attends religious service: Not on file    Active member of club or organization: Not on file    Attends meetings of clubs or organizations: Not on file    Relationship status: Not on file  Other Topics Concern  . Not on file  Social History Narrative  . Not on file    Allergies:  Allergies  Allergen Reactions  . Sulfa Antibiotics Other (See Comments)    Pt. Does not recall the reaction   . Zolpidem Tartrate Other (See Comments)    Altered mental status     Metabolic Disorder Labs: Lab Results  Component Value Date   HGBA1C 6.0 (H) 05/26/2017   MPG 125.5 05/26/2017   MPG 117 10/27/2015   No results found for: PROLACTIN Lab Results  Component Value Date   CHOL 154 05/26/2017   TRIG 225 (H) 05/26/2017   HDL 25 (L) 05/26/2017   CHOLHDL 6.2 05/26/2017   VLDL 45 (H) 05/26/2017   LDLCALC 84 05/26/2017   LDLCALC 136 (H) 08/01/2012   Lab Results  Component Value Date   TSH 2.771 05/26/2017   TSH 0.337 (L) 10/27/2015    Therapeutic Level Labs: No results found for: LITHIUM No results found for: VALPROATE No components found for:  CBMZ  Current Medications: Current Outpatient Medications  Medication Sig Dispense Refill  . atorvastatin (LIPITOR) 10 MG tablet Take 10 mg by mouth every evening.    . AZO-CRANBERRY PO Take by mouth.    . bifidobacterium infantis (ALIGN) capsule Take 1 capsule by mouth daily.     . calcium carbonate (OS-CAL - DOSED IN MG OF ELEMENTAL CALCIUM) 1250 (500 Ca) MG tablet Take by mouth.    . Calcium-Vitamin D-Vitamin K (VIACTIV PO) Take by mouth.    . cyclobenzaprine (FLEXERIL) 10 MG tablet Take 1 tablet (10 mg total) by mouth 2 (two) times daily as needed for muscle spasms. 20 tablet 0  .  FLUoxetine (PROZAC) 20 MG capsule Take 1 capsule (20 mg total) by mouth daily. 30 capsule 3  . gabapentin (NEURONTIN) 400 MG capsule Take 2 capsules (800 mg total) 2 (two) times daily by mouth. 120 capsule 0  . hydrochlorothiazide (MICROZIDE) 12.5 MG capsule Take 12.5 mg by mouth daily.    Marland Kitchen lisinopril (PRINIVIL,ZESTRIL) 10 MG tablet Take 10 mg by mouth every morning.    Marland Kitchen losartan (COZAAR) 25 MG tablet Take 2 tablets daily    . Melatonin 10 MG TABS Take by mouth.    . Multiple Vitamins-Calcium (VIACTIV MULTI-VITAMIN) CHEW Chew 1 each by mouth daily.    Marland Kitchen tiZANidine (ZANAFLEX) 2 MG tablet Take by mouth.    . traZODone (DESYREL) 50 MG tablet Take 1-2 tablets at bedtime for sleep.    . vitamin k 100 MCG tablet Take 100 mcg by mouth daily.     No current facility-administered medications for this visit.      Musculoskeletal: Strength & Muscle Tone: within normal limits Gait & Station: normal Patient leans: N/A  Psychiatric Specialty Exam: Review of Systems  Psychiatric/Behavioral: Negative for depression, hallucinations, memory loss, substance abuse and suicidal ideas. The patient is not nervous/anxious and does not have insomnia.   All other systems reviewed and are negative.   Blood pressure 122/80, pulse 61, height 5\' 3"  (1.6 m), weight 161 lb 9.6 oz (73.3 kg), SpO2 97 %.Body mass index is 28.63 kg/m.  General Appearance: Fairly Groomed  Eye Contact:  Good  Speech:  Clear and Coherent  Volume:  Normal  Mood:  "great"  Affect:  Appropriate, Congruent and euthymic  Thought Process:  Coherent  Orientation:  Full (Time, Place, and Person)  Thought Content: Logical   Suicidal  Thoughts:  No  Homicidal Thoughts:  No  Memory:  Immediate;   Good  Judgement:  Good  Insight:  Good  Psychomotor Activity:  Normal  Concentration:  Concentration: Good and Attention Span: Good  Recall:  Good  Fund of Knowledge: Good  Language: Good  Akathisia:  No  Handed:  Right  AIMS (if indicated): not done  Assets:  Communication Skills Desire for Improvement  ADL's:  Intact  Cognition: WNL  Sleep:  Good   Screenings: AUDIT     Admission (Discharged) from 05/24/2017 in [place]  Alcohol Use Disorder Identification Test Final Score (AUDIT)  0    PHQ2-9     Office Visit from 06/23/2017 in Primary Care at [place] from 03/03/2015 in Primary Care at [facility] Total Score  0  0       Assessment and Plan:  Brinly Maietta Moccia is a 67 y.o. year old female with a history of depression, hypertension, diet controlled DM,  OSA not on CPAP, IBS-diarrhea type, who presents for follow up appointment for depression. She is a patient of Dr. Adele Schilder. Per chart review, she was admitted to [facility] after overdosed CCB as a suicide attempt in the context of loss of her brother, which required ICU admission in 05/2017.   # MDD in remission Patient endorses significant improvement in her mood symptoms since the last appointment.  She reports her preference to be off fluoxetine; it is discussed that given her severity of symptoms last year, it is recommended to stay on fluoxetine at the same dose at least until the next appointment in December to avoid relapse (she denies prior episode of significant depression, and her neurovegetative symptoms were resolving when she was seen  by Dr. Adele Schilder in May). She is amenable to this plan. Will continue melatonin, trazodone prn for insomnia. Discussed behavioral activation.   Plan 1. Continue fluoxetine 20 mg daily 2. Continue Gabapentin 800 mg daily for neuropathy 3. Continue Melatonin 20 mg at night (OTC) 4. Continue  Trazodone 50-100 mg at night as needed for sleep 5. Return to clinic in December  The patient demonstrates the following risk factors for suicide: Chronic risk factors for suicide include: psychiatric disorder of depression and previous suicide attempts of overdosing CCB. Acute risk factors for suicide include: unemployment and loss (financial, interpersonal, professional). Protective factors for this patient include: positive social support, responsibility to others (children, family), coping skills and hope for the future. Considering these factors, the overall suicide risk at this point appears to be low. Patient is appropriate for outpatient follow up.   Norman Clay, MD 03/12/2018, 11:04 AM

## 2018-03-12 ENCOUNTER — Encounter (HOSPITAL_COMMUNITY): Payer: Self-pay | Admitting: Psychiatry

## 2018-03-12 ENCOUNTER — Ambulatory Visit (INDEPENDENT_AMBULATORY_CARE_PROVIDER_SITE_OTHER): Payer: Medicare Other | Admitting: Psychiatry

## 2018-03-12 VITALS — BP 122/80 | HR 61 | Ht 63.0 in | Wt 161.6 lb

## 2018-03-12 DIAGNOSIS — F3341 Major depressive disorder, recurrent, in partial remission: Secondary | ICD-10-CM | POA: Diagnosis not present

## 2018-03-12 DIAGNOSIS — F331 Major depressive disorder, recurrent, moderate: Secondary | ICD-10-CM

## 2018-03-12 MED ORDER — FLUOXETINE HCL 20 MG PO CAPS: 20.0000 mg | ORAL_CAPSULE | Freq: Every day | ORAL | 3 refills | Status: DC

## 2018-03-12 NOTE — Patient Instructions (Signed)
1. Continue fluoxetine 20 mg daily 2. Continue Gabapentin 800 mg daily for neuropathy 3. Continue Melatonin 20 mg at night (OTC) 4. Continue Trazodone 50-100 mg at night as needed for sleep 5. Return to clinic in December

## 2018-07-02 ENCOUNTER — Ambulatory Visit (INDEPENDENT_AMBULATORY_CARE_PROVIDER_SITE_OTHER): Payer: Medicare Other | Admitting: Psychiatry

## 2018-07-02 ENCOUNTER — Encounter (HOSPITAL_COMMUNITY): Payer: Self-pay | Admitting: Psychiatry

## 2018-07-02 VITALS — BP 142/79 | HR 55 | Ht 63.0 in | Wt 164.0 lb

## 2018-07-02 DIAGNOSIS — F331 Major depressive disorder, recurrent, moderate: Secondary | ICD-10-CM

## 2018-07-02 MED ORDER — TRAZODONE HCL 50 MG PO TABS: ORAL_TABLET | ORAL | 2 refills | Status: DC

## 2018-07-02 MED ORDER — FLUOXETINE HCL 10 MG PO CAPS: 10.0000 mg | ORAL_CAPSULE | Freq: Every day | ORAL | 2 refills | Status: DC

## 2018-07-02 NOTE — Progress Notes (Signed)
BH MD/PA/NP OP Progress Note  07/02/2018 10:38 AM Rachel White  MRN:  [id number]  Chief Complaint:  Chief Complaint    Depression; Follow-up     HPI: Rachel White is a 67 y.o. year old female with a history of depression, hypertension, diet controlled DM,  OSA not on CPAP, IBS-diarrhea type, who presents for follow up appointment for depression. She is a patient of Dr. Adele Schilder. Per chart review, she was admitted to [facility] after overdosed CCB as a suicide attempt, which required ICU admission in 05/2017.  The patient was last seen 3 months ago.  She states that she continues to do quite well.  Her mood is good as well as energy.  She continues on Prozac 20 mg daily but would like to get off of it.  She states the main reason is that she "cannot continue to travel to see all these various doctors."  She denies any current symptoms of depression anhedonia crying spells panic disorder.  She denies suicidal ideation.  She states that she is staying busy and active with her family.  We discussed the fact that her suicide attempt in 2018 was quite severe requiring ICU treatment.  I am reluctant to totally stop her antidepressant but will agree to taper it to a lower dose at this time.  She is in agreement with this plan.  She states however she cannot sleep well without the trazodone 50 to 100 mg at night. Visit Diagnosis:    ICD-10-CM   1. Moderate episode of recurrent major depressive disorder ([place]) F33.1     Past Psychiatric History: Prior admission in 2018 after overdose on calcium channel [place].  This was in the context of losing her brother to liver cirrhosis.  She states that remotely she had been depressed years ago when her husband had not spent money wisely and they were having significant financial issues.  She had "carried a suicide note in my purse for 10 years."  Past Medical History:  Past Medical History:  Diagnosis Date  . Complication of anesthesia    some difficulty  waking up after tubes tied  . Diabetes mellitus without complication ([name])    diet controlled  . Headache    migraines years ago  . Hypertension   . Irritable bowel syndrome    under control  . Neuropathic pain   . Pneumonia    as a child  . Sleep apnea    does not use C_pap    Past Surgical History:  Procedure Laterality Date  . COLONOSCOPY N/A 10/29/2015   Procedure: COLONOSCOPY;  Surgeon: Clarene Essex, MD;  Location: [facility];  Service: Endoscopy;  Laterality: N/A;  . DILATION AND CURETTAGE OF UTERUS    . FOOT SURGERY Left   . GASTRIC BYPASS    . ORIF TIBIA PLATEAU Left 10/19/2014   Procedure: OPEN REDUCTION INTERNAL FIXATION (ORIF) LEFT BICONDYLAR TIBIAL PLATEAU;  Surgeon: Leandrew Koyanagi, MD;  Location: [facility];  Service: Orthopedics;  Laterality: Left;  . TEMPORARY PACEMAKER N/A 05/19/2017   Procedure: TEMPORARY PACEMAKER;  Surgeon: Leonie Man, MD;  Location: [facility];  Service: Cardiovascular;  Laterality: N/A;  . TUBAL LIGATION      Family Psychiatric History: 1 son and 1 daughter have both been treated for depression  Family History:  Family History  Problem Relation Age of Onset  . Cancer Mother   . Cancer Other   . COPD Other   . Diabetes type II  Neg Hx     Social History:  Social History   Socioeconomic History  . Marital status: Married    Spouse name: Not on file  . Number of children: Not on file  . Years of education: Not on file  . Highest education level: Not on file  Occupational History  . Not on file  Social Needs  . Financial resource strain: Not on file  . Food insecurity:    Worry: Not on file    Inability: Not on file  . Transportation needs:    Medical: Not on file    Non-medical: Not on file  Tobacco Use  . Smoking status: Former Smoker    Packs/day: 1.00    Years: 41.00    Pack years: 41.00    Types: Cigarettes    Last attempt to quit: 02/23/2017    Years since quitting: 1.3  . Smokeless tobacco: Never Used  .  Tobacco comment: quit 3 months ago  Substance and Sexual Activity  . Alcohol use: No    Alcohol/week: 0.0 standard drinks  . Drug use: No  . Sexual activity: Never    Birth control/protection: None  Lifestyle  . Physical activity:    Days per week: Not on file    Minutes per session: Not on file  . Stress: Not on file  Relationships  . Social connections:    Talks on phone: Not on file    Gets together: Not on file    Attends religious service: Not on file    Active member of club or organization: Not on file    Attends meetings of clubs or organizations: Not on file    Relationship status: Not on file  Other Topics Concern  . Not on file  Social History Narrative  . Not on file    Allergies:  Allergies  Allergen Reactions  . Sulfa Antibiotics Other (See Comments)    Pt. Does not recall the reaction   . Zolpidem Tartrate Other (See Comments)    Altered mental status     Metabolic Disorder Labs: Lab Results  Component Value Date   HGBA1C 6.0 (H) 05/26/2017   MPG 125.5 05/26/2017   MPG 117 10/27/2015   No results found for: PROLACTIN Lab Results  Component Value Date   CHOL 154 05/26/2017   TRIG 225 (H) 05/26/2017   HDL 25 (L) 05/26/2017   CHOLHDL 6.2 05/26/2017   VLDL 45 (H) 05/26/2017   LDLCALC 84 05/26/2017   LDLCALC 136 (H) 08/01/2012   Lab Results  Component Value Date   TSH 2.771 05/26/2017   TSH 0.337 (L) 10/27/2015    Therapeutic Level Labs: No results found for: LITHIUM No results found for: VALPROATE No components found for:  CBMZ  Current Medications: Current Outpatient Medications  Medication Sig Dispense Refill  . atorvastatin (LIPITOR) 10 MG tablet Take 10 mg by mouth every evening.    . calcium carbonate (OS-CAL - DOSED IN MG OF ELEMENTAL CALCIUM) 1250 (500 Ca) MG tablet Take by mouth.    . Calcium-Vitamin D-Vitamin K (VIACTIV PO) Take by mouth.    . cyclobenzaprine (FLEXERIL) 10 MG tablet Take 1 tablet (10 mg total) by mouth 2 (two)  times daily as needed for muscle spasms. 20 tablet 0  . gabapentin (NEURONTIN) 400 MG capsule Take 2 capsules (800 mg total) 2 (two) times daily by mouth. 120 capsule 0  . hydrochlorothiazide (MICROZIDE) 12.5 MG capsule Take 12.5 mg by mouth daily.    Marland Kitchen  lisinopril (PRINIVIL,ZESTRIL) 10 MG tablet Take 10 mg by mouth every morning.    Marland Kitchen losartan (COZAAR) 25 MG tablet Take 2 tablets daily    . Melatonin 10 MG TABS Take by mouth.    . Multiple Vitamins-Calcium (VIACTIV MULTI-VITAMIN) CHEW Chew 1 each by mouth daily.    Marland Kitchen tiZANidine (ZANAFLEX) 2 MG tablet Take by mouth.    . traZODone (DESYREL) 50 MG tablet Take 1-2 tablets at bedtime for sleep. 60 tablet 2  . vitamin k 100 MCG tablet Take 100 mcg by mouth daily.    . AZO-CRANBERRY PO Take by mouth.    . bifidobacterium infantis (ALIGN) capsule Take 1 capsule by mouth daily.    Marland Kitchen FLUoxetine (PROZAC) 10 MG capsule Take 1 capsule (10 mg total) by mouth daily. 30 capsule 2   No current facility-administered medications for this visit.      Musculoskeletal: Strength & Muscle Tone: within normal limits Gait & Station: normal Patient leans: N/A  Psychiatric Specialty Exam: Review of Systems  Musculoskeletal: Positive for back pain.  All other systems reviewed and are negative.   Blood pressure (!) 142/79, pulse (!) 55, height 5\' 3"  (1.6 m), weight 164 lb (74.4 kg), SpO2 97 %.Body mass index is 29.05 kg/m.  General Appearance: Casual, Neat and Well Groomed  Eye Contact:  Good  Speech:  Clear and Coherent  Volume:  Normal  Mood:  Euthymic  Affect:  Appropriate and Congruent  Thought Process:  Goal Directed  Orientation:  Full (Time, Place, and Person)  Thought Content: WDL   Suicidal Thoughts:  No  Homicidal Thoughts:  No  Memory:  Immediate;   Good Recent;   Good Remote;   Fair  Judgement:  Fair  Insight:  Fair  Psychomotor Activity:  Decreased  Concentration:  Concentration: Good and Attention Span: Good  Recall:  Good  Fund of  Knowledge: Fair  Language: Good  Akathisia:  No  Handed:  Right  AIMS (if indicated): not done  Assets:  Communication Skills Desire for Improvement Resilience Social Support Talents/Skills  ADL's:  Intact  Cognition: WNL  Sleep:  Good   Screenings: AUDIT     Admission (Discharged) from 05/24/2017 in [place]  Alcohol Use Disorder Identification Test Final Score (AUDIT)  0    PHQ2-9     Office Visit from 06/23/2017 in Primary Care at [facility] from 03/03/2015 in Primary Care at [facility] Total Score  0  0       Assessment and Plan: This patient is a 67 year old female with a history of depression.  She has been doing well since her admission in 2018.  I have agreed to cut continue Prozac but decrease the dose to 10 mg daily for depression.  She will see Dr. Adele Schilder within 3 months and decide if she wants to stay on the medication.  She will continue trazodone 50 to 100 mg at bedtime for sleep.   Levonne Spiller, MD 07/02/2018, 10:38 AM

## 2018-10-08 ENCOUNTER — Ambulatory Visit (HOSPITAL_COMMUNITY): Payer: Medicare Other | Admitting: Psychiatry

## 2018-10-08 ENCOUNTER — Other Ambulatory Visit (HOSPITAL_COMMUNITY): Payer: Self-pay | Admitting: Psychiatry

## 2018-10-13 ENCOUNTER — Other Ambulatory Visit (HOSPITAL_COMMUNITY): Payer: Self-pay

## 2018-10-13 MED ORDER — FLUOXETINE HCL 10 MG PO CAPS: 10.0000 mg | ORAL_CAPSULE | Freq: Every day | ORAL | 0 refills | Status: DC

## 2018-10-27 ENCOUNTER — Ambulatory Visit (INDEPENDENT_AMBULATORY_CARE_PROVIDER_SITE_OTHER): Payer: Medicare Other | Admitting: Psychiatry

## 2018-10-27 ENCOUNTER — Other Ambulatory Visit: Payer: Self-pay

## 2018-10-27 DIAGNOSIS — F5101 Primary insomnia: Secondary | ICD-10-CM | POA: Diagnosis not present

## 2018-10-27 DIAGNOSIS — F330 Major depressive disorder, recurrent, mild: Secondary | ICD-10-CM

## 2018-10-27 MED ORDER — FLUOXETINE HCL 10 MG PO CAPS: 10.0000 mg | ORAL_CAPSULE | Freq: Every day | ORAL | 0 refills | Status: DC

## 2018-10-27 MED ORDER — TRAZODONE HCL 50 MG PO TABS: ORAL_TABLET | ORAL | 2 refills | Status: DC

## 2018-10-27 NOTE — Progress Notes (Signed)
Virtual Visit via Telephone Note  I connected with Rachel White on 10/27/18 at 11:00 AM EDT by telephone and verified that I am speaking with the correct person using two identifiers.   I discussed the limitations, risks, security and privacy concerns of performing an evaluation and management service by telephone and the availability of in person appointments. I also discussed with the patient that there may be a patient responsible charge related to this service. The patient expressed understanding and agreed to proceed.   History of Present Illness: Patient was evaluated through phone session.  She reported her depression is a stable and she does not feel hopeless or helpless.  She reported good relationship with her family.  She lives with her husband and 20 year old son.  Her husband currently follow due to pandemic coronavirus.  She is retired.  She is still requiring melatonin and trazodone to help sleep.  She like to come off from the Prozac 10 mg which was reduced from 20 mg to 10 mg by Dr. Harrington Challenger on her last appointment.  She admitted sometimes get anxious and nervous about the current situation.  However she denies any suicidal thoughts or homicidal thoughts.  Her level is fair.  She is also taking gabapentin for neuropathy prescribed by Antonieta Pert.  Patient denies drinking or using any illegal substances.  Past Psychiatric History: Reviewed. H/O inpatient at [facility] in November 2018 after taking overdose on blood pressure medication required ICU treatment. D/C on Abilify but switched to Prozac due to tremors. No h/o psychosis, hallucination, paranoia or any mania.   Observations/Objective: Limited mental status attention done on the phone.  Patient describes her mood is okay.  Her speech is clear, soft, normal tone and volume.  She denies any auditory or visual hallucination.  She denies any active or passive suicidal thoughts or homicidal thought.  There were no  delusions, paranoia.  She reported no tremors shakes and denies any mania.  She is alert and oriented x3.  Her attention and concentration appears to be normal.  Her fund of knowledge is adequate.  Her cognition is intact.  Her insight judgment is okay.  Assessment and Plan: Major depressive disorder, recurrent.  Primary insomnia.  Discussed current situation as it causes some time her anxiety.  I recommend not to stop the Prozac for now however we will consider if situation gets better in the future.  I explained that stopping Prozac may cause worsening of anxiety.  She is taking melatonin, trazodone and gabapentin.  I recommend if she is sleeping with melatonin then she may consider lowering her trazodone from 100 mg to 50 mg.  However if she noticed sleep having issues then she may go back to 100 mg trazodone.  Continue Prozac for now 10 mg daily.  We will consider taking her off from Prozac on her next appointment.  Follow-up in 3 months.  Follow Up Instructions:    I discussed the assessment and treatment plan with the patient. The patient was provided an opportunity to ask questions and all were answered. The patient agreed with the plan and demonstrated an understanding of the instructions.   The patient was advised to call back or seek an in-person evaluation if the symptoms worsen or if the condition fails to improve as anticipated.  I provided 20 minutes of non-face-to-face time during this encounter.   Kathlee Nations, MD

## 2018-10-29 ENCOUNTER — Ambulatory Visit (HOSPITAL_COMMUNITY): Payer: Medicare Other | Admitting: Psychiatry

## 2019-01-24 IMAGING — DX DG CHEST 1V PORT
1 series · 1 of 1 positions shown · non-contrast
Comparison: 05/19/2006

CLINICAL DATA: Overdose

EXAM:
PORTABLE CHEST 1 VIEW

[chest ap]
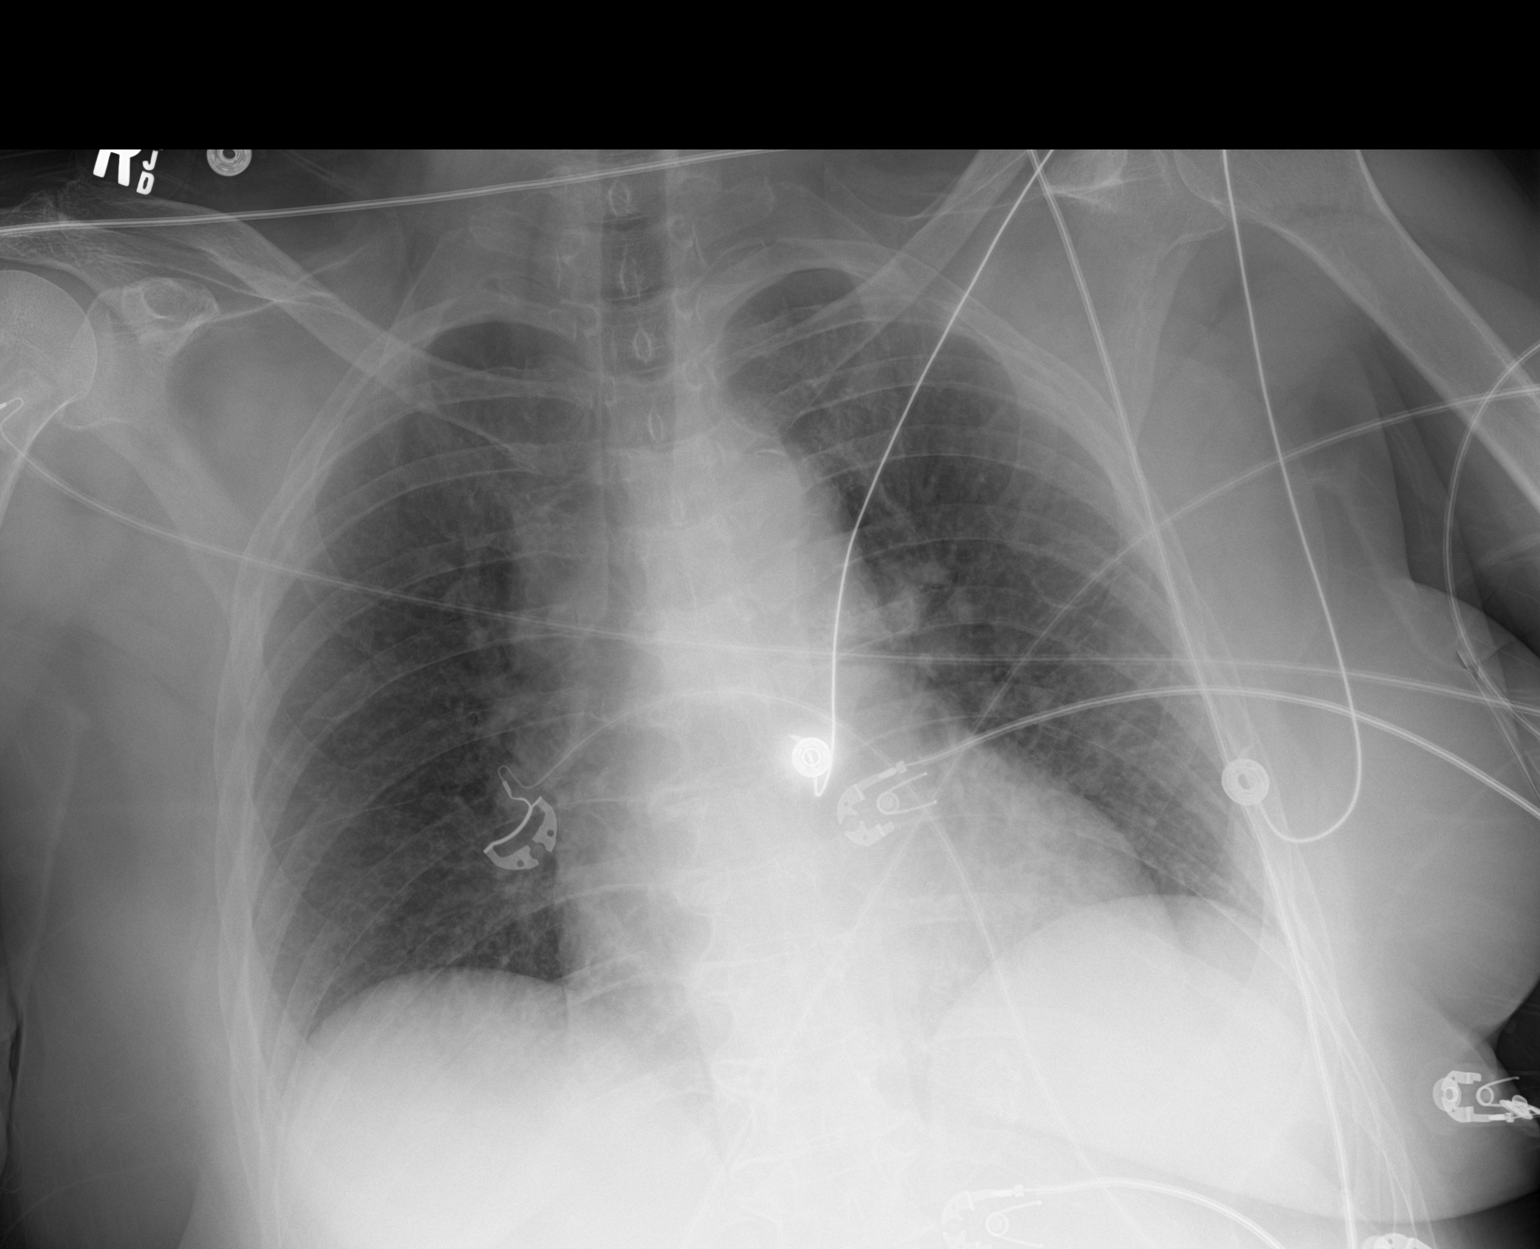

[1 of 1 positions shown; findings below may reference images not displayed]

FINDINGS: Artifact overlies the chest. Heart size is normal. There is aortic
atherosclerosis. Lungs are clear. The vascularity is normal. No
effusions. No bone abnormalities.
IMPRESSION: No active disease.  Aortic atherosclerosis.

## 2019-01-26 ENCOUNTER — Encounter (HOSPITAL_COMMUNITY): Payer: Self-pay | Admitting: Psychiatry

## 2019-01-26 ENCOUNTER — Other Ambulatory Visit: Payer: Self-pay

## 2019-01-26 ENCOUNTER — Ambulatory Visit (INDEPENDENT_AMBULATORY_CARE_PROVIDER_SITE_OTHER): Payer: Medicare Other | Admitting: Psychiatry

## 2019-01-26 DIAGNOSIS — F330 Major depressive disorder, recurrent, mild: Secondary | ICD-10-CM

## 2019-01-26 DIAGNOSIS — F5101 Primary insomnia: Secondary | ICD-10-CM | POA: Diagnosis not present

## 2019-01-26 MED ORDER — FLUOXETINE HCL 10 MG PO CAPS: 10.0000 mg | ORAL_CAPSULE | Freq: Every day | ORAL | 0 refills | Status: AC

## 2019-01-26 MED ORDER — TRAZODONE HCL 50 MG PO TABS: ORAL_TABLET | ORAL | 2 refills | Status: DC

## 2019-01-26 NOTE — Progress Notes (Signed)
Virtual Visit via Telephone Note  I connected with Rachel White on 01/26/19 at 10:20 AM EDT by telephone and verified that I am speaking with the correct person using two identifiers.   I discussed the limitations, risks, security and privacy concerns of performing an evaluation and management service by telephone and the availability of in person appointments. I also discussed with the patient that there may be a patient responsible charge related to this service. The patient expressed understanding and agreed to proceed.   History of Present Illness: Patient was evaluated through phone session.  She is doing very well.  Recently she had a new puppy and she enjoying the time with the puppy.  She try to cut down her trazodone but she could not sleep and decided to go back to take 50 mg of trazodone 2 tablet every night.  She also takes over-the-counter melatonin, tizanidine, and gabapentin.  She feels her depression is stable and like to come off from Prozac.  Her dose has been gradually decreased and now she is only taking 10 mg.  She has Parkinson but her tremors are better since taking the Sinemet.  She sees neurology for neuropathic pain and Parkinson.  Patient lives with her husband who is very supportive.  She is pleased that her husband recently started working and going to the office.  Her husband works for [employer].  Patient sleeping better.  She denies any crying spells, irritability, feeling of hopelessness or worthlessness.  She denies any suicidal thoughts.  Her appetite is okay.  Her energy level is good.  She reported her weight is stable.  She denies drinking or using any illegal substances.   Past Psychiatric History:Reviewed. H/O inpatient at [facility] in November 2018 after taking overdose on blood pressure medication required ICUtreatment. D/C on Abilify but switched to Prozac due to tremors. No h/o psychosis, hallucination, paranoia or any  mania.    Psychiatric Specialty Exam: Physical Exam  ROS  There were no vitals taken for this visit.There is no height or weight on file to calculate BMI.  General Appearance: NA  Eye Contact:  NA  Speech:  Clear and Coherent and Normal Rate  Volume:  Normal  Mood:  Euthymic  Affect:  NA  Thought Process:  Goal Directed  Orientation:  Full (Time, Place, and Person)  Thought Content:  WDL and Logical  Suicidal Thoughts:  No  Homicidal Thoughts:  No  Memory:  Immediate;   Good Recent;   Good Remote;   Good  Judgement:  Good  Insight:  Good  Psychomotor Activity:  Normal  Concentration:  Concentration: Good and Attention Span: Good  Recall:  Good  Fund of Knowledge:  Good  Language:  Good  Akathisia:  No  Handed:  Right  AIMS (if indicated):     Assets:  Communication Skills Desire for Improvement Housing Resilience Social Support  ADL's:  Intact  Cognition:  WNL  Sleep:   good      Assessment and Plan: Major depressive disorder, recurrent.  Primary insomnia.  Patient like to come off from Prozac however she wanted to have another refill in case she decided to go back on Prozac if depression started to get come back or worse.  She is only taking Prozac 10 mg.  After some discussion with signs of relapse we agreed to take her off from Prozac but we will provide prescription in case her depression comes back.  She does not want to  stop trazodone since it is working very well for her sleep.  Continue trazodone 50 mg to take 2 tablet at bedtime.  She is also taking gabapentin, tizanidine, melatonin which is also helping her sleep.  Discussed medication side effects and benefits.  Recommended to call us back if she has any question or any concern.  Follow-up in 3 months.  Follow Up Instructions:    I discussed the assessment and treatment plan with the patient. The patient was provided an opportunity to ask questions and all were answered. The patient agreed with the plan  and demonstrated an understanding of the instructions.   The patient was advised to call back or seek an in-person evaluation if the symptoms worsen or if the condition fails to improve as anticipated.  I provided 15 minutes of non-face-to-face time during this encounter.   Kathlee Nations, MD

## 2019-01-27 IMAGING — DX DG ABD PORTABLE 1V
2 series · 2 of 2 positions shown · non-contrast
Comparison: CT 03/31/2016

CLINICAL DATA: 66-year-old female with a history of abdominal pain

EXAM:
PORTABLE ABDOMEN - 1 VIEW

[abdomen kub (1 of 2)]
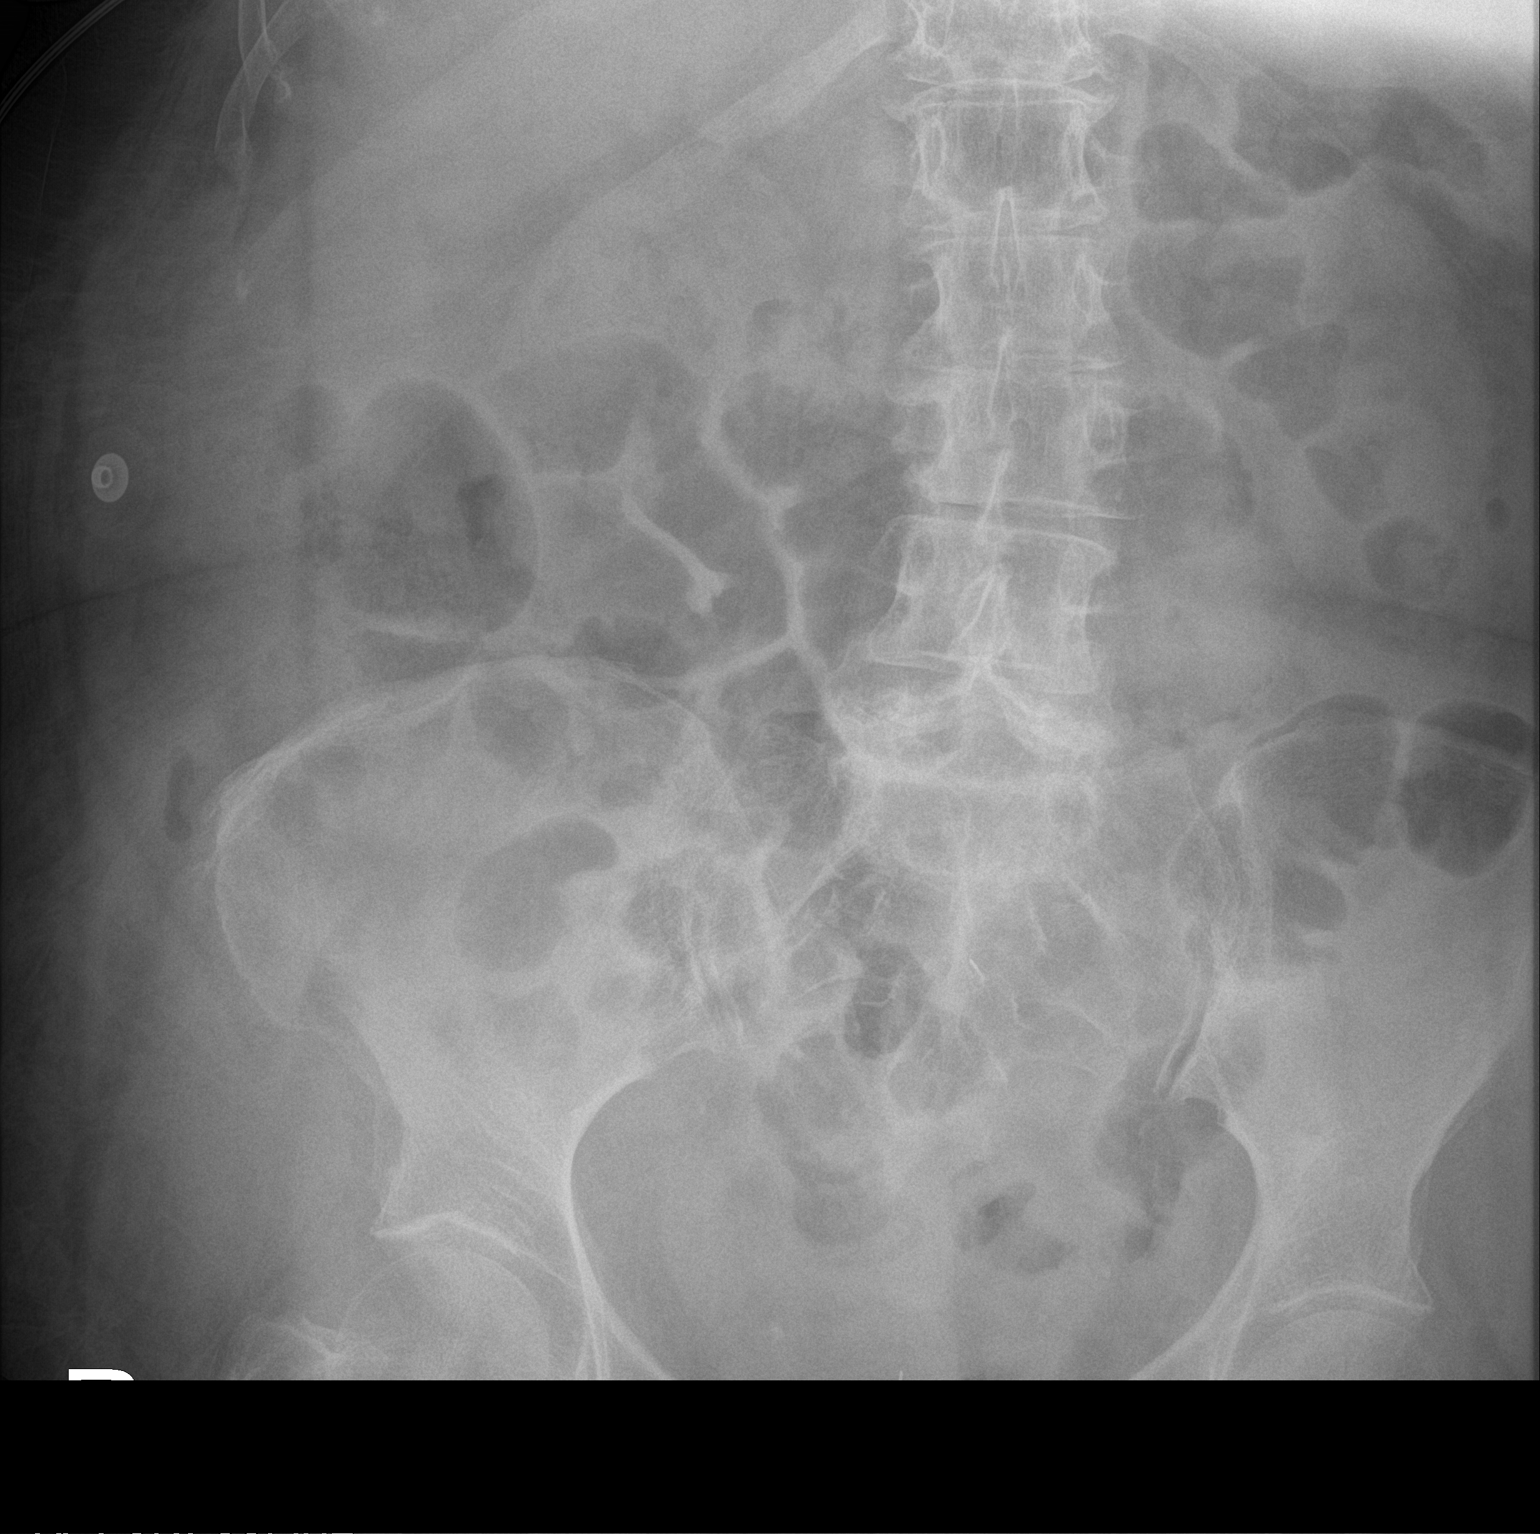

[abdomen kub (2 of 2)]
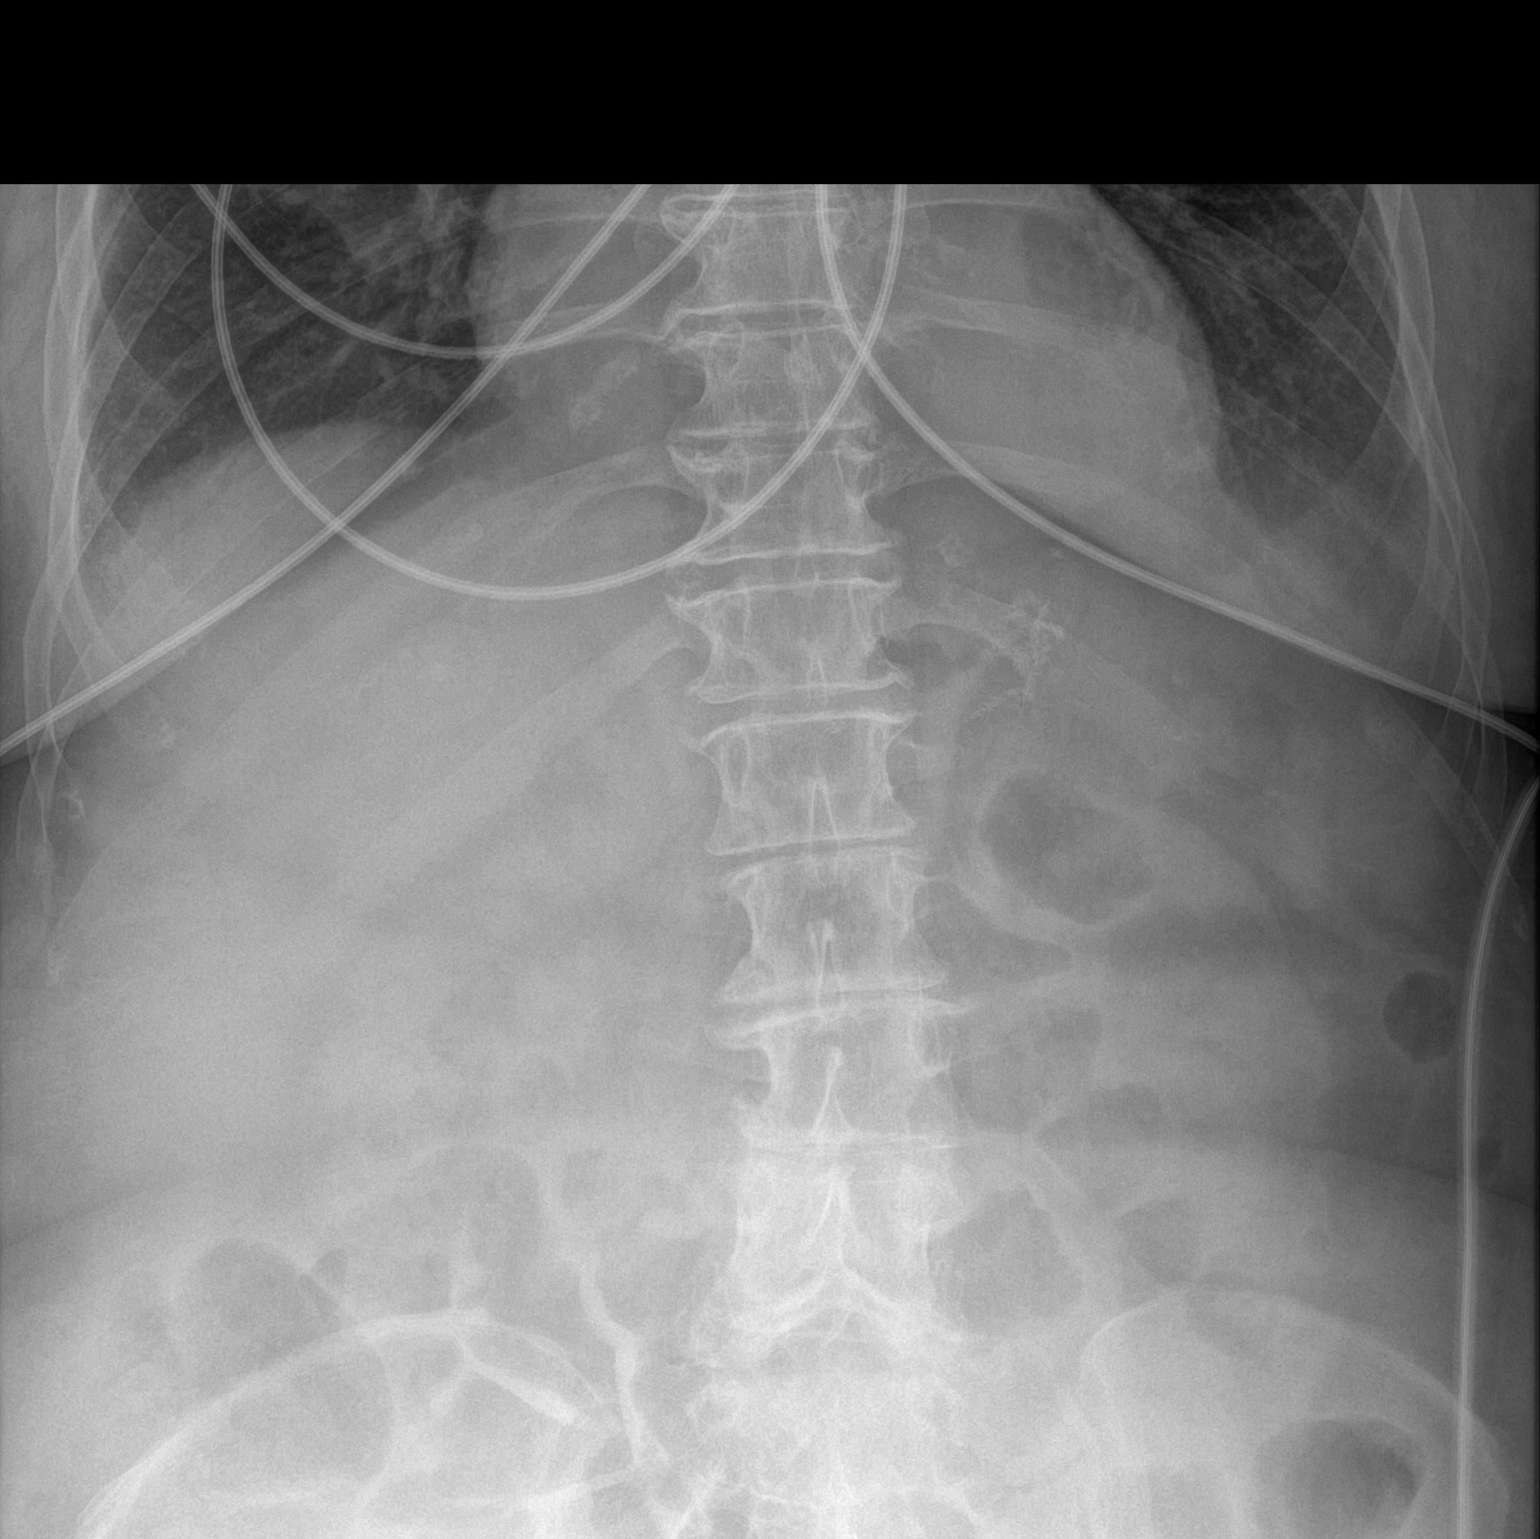

[2 of 2 positions shown; findings below may reference images not displayed]

FINDINGS: Gas within small bowel and colon.  No abnormal distention.

Paucity of gas within the stomach lumen.

Surgical changes again noted in the left upper quadrant.

No unexpected calcifications or radiopaque foreign body.

Pelvic thermister.
IMPRESSION: Nonobstructive bowel gas pattern.

Surgical changes of the left upper quadrant.

## 2019-01-27 IMAGING — DX DG CHEST 1V PORT
1 series · 1 of 1 positions shown · non-contrast
Comparison: 05/19/2017

CLINICAL DATA: 66-year-old female with a history of respiratory
failure

EXAM:
PORTABLE CHEST 1 VIEW

[chest ap]
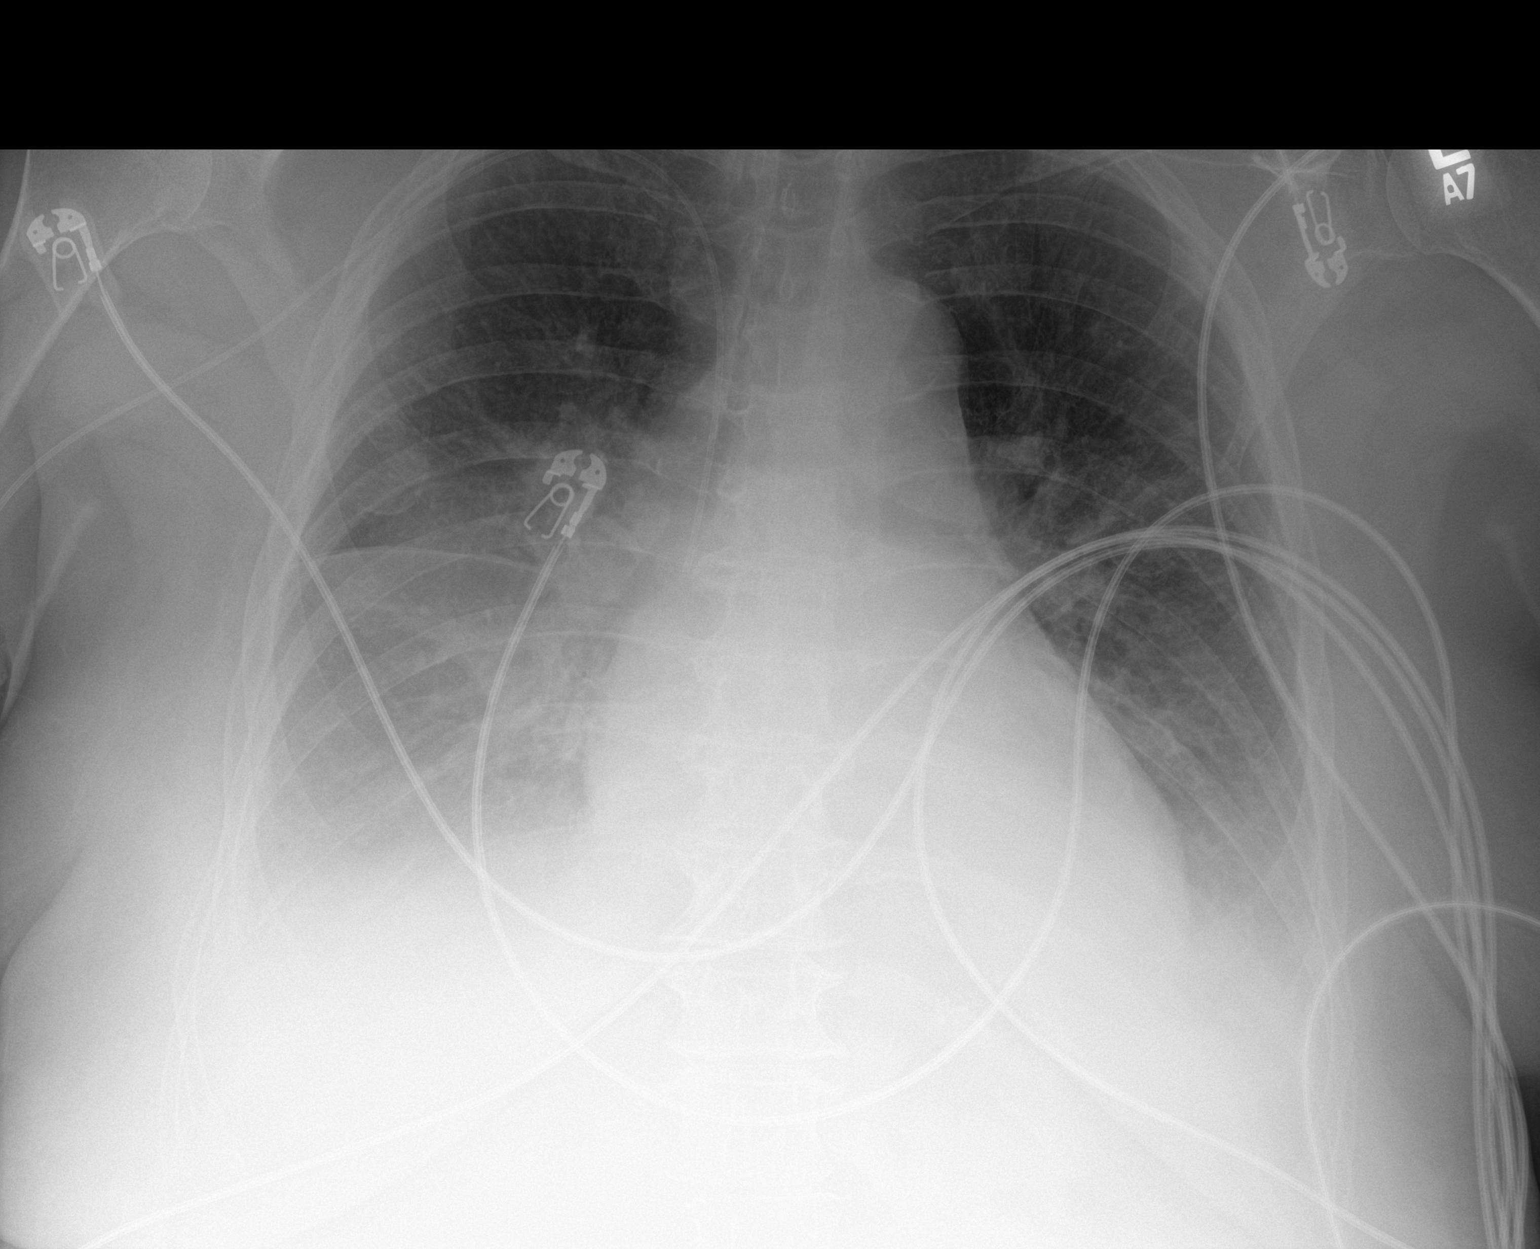

[1 of 1 positions shown; findings below may reference images not displayed]

FINDINGS: Cardiomediastinal silhouette unchanged.

Interval placement of right upper extremity PICC with the tip
appearing to terminate at the superior vena cava.

Interval development of hazy opacity at the right base with
obscuration the right hemidiaphragm in the right heart border.

Interval development of opacity at the left base partially obscuring
the left hemidiaphragm.
IMPRESSION: Right greater than left pleural effusion with associated
atelectasis. Superimposed infection cannot be excluded.

Interval placement of right-sided PICC.

## 2019-04-28 ENCOUNTER — Other Ambulatory Visit: Payer: Self-pay

## 2019-04-28 ENCOUNTER — Encounter (HOSPITAL_COMMUNITY): Payer: Self-pay | Admitting: Psychiatry

## 2019-04-28 ENCOUNTER — Ambulatory Visit (INDEPENDENT_AMBULATORY_CARE_PROVIDER_SITE_OTHER): Payer: Medicare Other | Admitting: Psychiatry

## 2019-04-28 DIAGNOSIS — F330 Major depressive disorder, recurrent, mild: Secondary | ICD-10-CM | POA: Diagnosis not present

## 2019-04-28 DIAGNOSIS — F5101 Primary insomnia: Secondary | ICD-10-CM

## 2019-04-28 MED ORDER — TRAZODONE HCL 100 MG PO TABS: 100.0000 mg | ORAL_TABLET | Freq: Every day | ORAL | 0 refills | Status: AC

## 2019-04-28 NOTE — Progress Notes (Signed)
Virtual Visit via Telephone Note  I connected with Rachel White on 04/28/19 at 10:20 AM EDT by telephone and verified that I am speaking with the correct person using two identifiers.   I discussed the limitations, risks, security and privacy concerns of performing an evaluation and management service by telephone and the availability of in person appointments. I also discussed with the patient that there may be a patient responsible charge related to this service. The patient expressed understanding and agreed to proceed.   History of Present Illness: Patient was evaluated by phone session.  She is taking trazodone 100 mg at bedtime.  She stopped taking Prinzide because she does not feel she need Prozac as her depression is resolved.  She denies any irritability, crying spells, feeling of hopelessness, anhedonia or any suicidal thoughts.  She takes melatonin and muscle relaxant at nighttime along with trazodone which helps his sleep.  She has Parkinson and she is seen neurology for neuropathy pain and Parkinson.  She lives with her husband who is very supportive.  Patient denies any paranoia or any hallucination.  She like to continue trazodone 100 mg but wondering if she can get the medication from PCP.  She denies drinking or using any illegal substances.  Her energy level is good.  Her appetite is okay.  Past Psychiatric History:Reviewed. [facility] after taking overdose on blood pressure medication required ICUtreatment.D/C on Abilify but switched to Prozacdue totremors.No h/opsychosis, hallucination, paranoia or any mania.   Psychiatric Specialty Exam: Physical Exam  ROS  There were no vitals taken for this visit.There is no height or weight on file to calculate BMI.  General Appearance: NA  Eye Contact:  NA  Speech:  Clear and Coherent and Normal Rate  Volume:  Normal  Mood:  Euthymic  Affect:  NA  Thought Process:  Goal  Directed  Orientation:  Full (Time, Place, and Person)  Thought Content:  WDL and Logical  Suicidal Thoughts:  No  Homicidal Thoughts:  No  Memory:  Immediate;   Good Recent;   Good Remote;   Good  Judgement:  Good  Insight:  Good  Psychomotor Activity:  NA  Concentration:  Concentration: Good and Attention Span: Good  Recall:  Good  Fund of Knowledge:  Good  Language:  Good  Akathisia:  No  Handed:  Right  AIMS (if indicated):     Assets:  Communication Skills Desire for Improvement Housing Resilience  ADL's:  Intact  Cognition:  WNL  Sleep:   ok      Assessment and Plan: Patient depressive disorder, recurrent.  Primary insomnia.  Patient is not taking Prozac.  We will discontinue as her depression is much better without medication.  However she like to continue trazodone to help her insomnia.  I recommended to contact PCP if her PCP is willing to give trazodone that she does not need any appointment.  However if PCP does not feel comfortable then she can schedule appointment in 6 months.  We will continue trazodone 100 mg at bedtime.  Discussed medication side effects and benefits.  Recommended to call us back if she has any question or concern.  We will schedule appointment in 6 months unless patient called and canceled appointment.  Follow Up Instructions:    I discussed the assessment and treatment plan with the patient. The patient was provided an opportunity to ask questions and all were answered. The patient agreed with the plan and  demonstrated an understanding of the instructions.   The patient was advised to call back or seek an in-person evaluation if the symptoms worsen or if the condition fails to improve as anticipated.  I provided 20 minutes of non-face-to-face time during this encounter.   Kathlee Nations, MD

## 2019-10-21 ENCOUNTER — Emergency Department (HOSPITAL_COMMUNITY)
Admission: EM | Admit: 2019-10-21 | Discharge: 2019-10-21 | Disposition: A | Discharge status: Home / Self Care | Payer: Medicare Other | Attending: Emergency Medicine | Admitting: Emergency Medicine

## 2019-10-21 ENCOUNTER — Other Ambulatory Visit: Payer: Self-pay

## 2019-10-21 ENCOUNTER — Emergency Department (HOSPITAL_COMMUNITY): Payer: Medicare Other

## 2019-10-21 ENCOUNTER — Encounter (HOSPITAL_COMMUNITY): Payer: Self-pay | Admitting: *Deleted

## 2019-10-21 DIAGNOSIS — Z7984 Long term (current) use of oral hypoglycemic drugs: Secondary | ICD-10-CM | POA: Insufficient documentation

## 2019-10-21 DIAGNOSIS — E119 Type 2 diabetes mellitus without complications: Secondary | ICD-10-CM | POA: Insufficient documentation

## 2019-10-21 DIAGNOSIS — Z79899 Other long term (current) drug therapy: Secondary | ICD-10-CM | POA: Insufficient documentation

## 2019-10-21 DIAGNOSIS — Z87891 Personal history of nicotine dependence: Secondary | ICD-10-CM | POA: Insufficient documentation

## 2019-10-21 DIAGNOSIS — M25551 Pain in right hip: Secondary | ICD-10-CM

## 2019-10-21 DIAGNOSIS — N183 Chronic kidney disease, stage 3 unspecified: Secondary | ICD-10-CM | POA: Insufficient documentation

## 2019-10-21 DIAGNOSIS — I129 Hypertensive chronic kidney disease with stage 1 through stage 4 chronic kidney disease, or unspecified chronic kidney disease: Secondary | ICD-10-CM | POA: Insufficient documentation

## 2019-10-21 MED ORDER — LIDOCAINE 5 % EX PTCH: 1.0000 | MEDICATED_PATCH | CUTANEOUS | 0 refills | Status: AC

## 2019-10-21 MED ORDER — PREDNISONE 20 MG PO TABS: 40.0000 mg | ORAL_TABLET | Freq: Every day | ORAL | 0 refills | Status: AC

## 2019-10-21 NOTE — ED Triage Notes (Signed)
Patient presents with right hip,thigh, and knee pain. Pain rating 8/10. Patient states pain started about three weeks ago with no known cause.

## 2019-10-21 NOTE — ED Provider Notes (Signed)
[facility] DEPT Provider Note   CSN: [id number] Arrival date & time: 10/21/19  0846     History Chief Complaint  Patient presents with  . Knee Pain    Right hip/knee/ thigh pain     Rachel White is a 69 y.o. female.  The history is provided by the patient.  Knee Pain Location:  Hip (report 3 weeks ago right hip started hurting spontaneously without injury or overuse and not getting better) Time since incident:  3 weeks Injury: no   Hip location:  R hip Pain details:    Quality:  Aching, shooting and throbbing   Radiates to:  R leg (shoots down the lateral portion of the leg to the knee)   Severity:  Severe (8/10)   Onset quality:  Gradual   Duration:  3 weeks   Timing:  Constant   Progression:  Worsening Chronicity:  New Relieved by: moving around throughout the day seems to improve the pain some but present all day. Exacerbated by: very stiff in the morning to the point it is hard to get out of bed. Ineffective treatments:  Acetaminophen and rest Associated symptoms: back pain and stiffness   Associated symptoms: no decreased ROM, no muscle weakness, no numbness, no swelling and no tingling   Associated symptoms comment:  No bowel or bladder incontinence or retention Risk factors comment:  Long term back problems that she gets shots with Dr. Marlou Sa regularly and next appt is next month      Past Medical History:  Diagnosis Date  . Complication of anesthesia    some difficulty waking up after tubes tied  . Diabetes mellitus without complication ([name])    diet controlled  . Headache    migraines years ago  . Hypertension   . Irritable bowel syndrome    under control  . Neuropathic pain   . Pneumonia    as a child  . Sleep apnea    does not use C_pap    Patient Active Problem List   Diagnosis Date Noted  . Recurrent major depressive disorder, in partial remission ([place]) 03/12/2018  . Tremor 10/12/2017  . Suicide attempt  ([place]) 06/03/2017  . OCD (obsessive compulsive disorder) 05/28/2017  . Tobacco use disorder 05/24/2017  . Major depressive disorder, single episode, severe with psychotic features ([place]) 05/24/2017  . Intentional drug overdose ([place])   . Overdose of calcium-channel Marich 05/19/2017  . Overdose of cardiovascular agent 05/19/2017  . Shock circulatory ([place])   . Symptomatic bradycardia   . Acute blood loss anemia 11/10/2015  . Severe epistaxis 11/09/2015  . Epistaxis 11/08/2015  . CKD (chronic kidney disease) stage 3, GFR 30-59 ml/min 11/08/2015  . Dehydration 10/26/2015  . Acute renal failure (ARF) ([place]) 10/26/2015  . Hypotension 10/26/2015  . Hypokalemia 10/26/2015  . Diabetes mellitus ([place]) 10/26/2015  . Sleep apnea 10/26/2015  . Diarrhea 10/26/2015  . Chronic back pain greater than 3 months duration 07/29/2015  . Hyperlipidemia 07/29/2015  . IBS (irritable bowel syndrome) 07/29/2015  . Osteopenia 07/29/2015  . Fracture of left tibial plateau 10/19/2014  . Tibial plateau fracture 10/19/2014  . Peripheral neuropathy ([place]) 08/01/2012  . Insomnia 08/01/2012  . Chronic diarrhea 08/01/2012    Past Surgical History:  Procedure Laterality Date  . COLONOSCOPY N/A 10/29/2015   Procedure: COLONOSCOPY;  Surgeon: Clarene Essex, MD;  Location: [facility];  Service: Endoscopy;  Laterality: N/A;  . DILATION AND CURETTAGE OF UTERUS    . FOOT SURGERY  Left   . GASTRIC BYPASS    . ORIF TIBIA PLATEAU Left 10/19/2014   Procedure: OPEN REDUCTION INTERNAL FIXATION (ORIF) LEFT BICONDYLAR TIBIAL PLATEAU;  Surgeon: Leandrew Koyanagi, MD;  Location: [facility];  Service: Orthopedics;  Laterality: Left;  . TEMPORARY PACEMAKER N/A 05/19/2017   Procedure: TEMPORARY PACEMAKER;  Surgeon: Leonie Man, MD;  Location: [facility];  Service: Cardiovascular;  Laterality: N/A;  . TUBAL LIGATION       OB History   No obstetric history on file.     Family History  Problem Relation Age of Onset  . Cancer  Mother   . Cancer Other   . COPD Other   . Diabetes type II Neg Hx     Social History   Tobacco Use  . Smoking status: Former Smoker    Packs/day: 1.00    Years: 41.00    Pack years: 41.00    Types: Cigarettes    Quit date: 02/23/2017    Years since quitting: 2.6  . Smokeless tobacco: Never Used  . Tobacco comment: quit 3 months ago  Substance Use Topics  . Alcohol use: No    Alcohol/week: 0.0 standard drinks  . Drug use: No    Home Medications Prior to Admission medications   Medication Sig Start Date End Date Taking? Authorizing Provider  atorvastatin (LIPITOR) 10 MG tablet Take 10 mg by mouth every evening.    [provider]  AZO-CRANBERRY PO Take by mouth.    [provider]  bifidobacterium infantis (ALIGN) capsule Take 1 capsule by mouth daily.    [provider]  calcium carbonate (OS-CAL - DOSED IN MG OF ELEMENTAL CALCIUM) 1250 (500 Ca) MG tablet Take by mouth.    [provider]  Calcium-Vitamin D-Vitamin K (VIACTIV PO) Take by mouth.    [provider]  carbidopa-levodopa (SINEMET IR) 25-100 MG tablet TAKE 2 TABLETS BY MOUTH THREE TIMES DAILY 11/30/18   [provider]  cyclobenzaprine (FLEXERIL) 10 MG tablet Take 1 tablet (10 mg total) by mouth 2 (two) times daily as needed for muscle spasms. 06/20/17   Charlann Lange, PA-C  FLUoxetine (PROZAC) 10 MG capsule Take 1 capsule (10 mg total) by mouth daily. 01/26/19 01/26/20  Arfeen, Arlyce Harman, MD  gabapentin (NEURONTIN) 800 MG tablet Take 800 mg by mouth 3 (three) times daily.    Hunter, Megan A, FNP  hydrochlorothiazide (MICROZIDE) 12.5 MG capsule Take 12.5 mg by mouth daily.    [provider]  lisinopril (PRINIVIL,ZESTRIL) 10 MG tablet Take 10 mg by mouth every morning.    [provider]  losartan (COZAAR) 100 MG tablet Take 100 mg by mouth daily. 11/08/18   [provider]  Melatonin 10 MG TABS Take by mouth.    [provider]    metoprolol succinate (TOPROL-XL) 25 MG 24 hr tablet TAKE 1 TABLET BY MOUTH ONCE DAILY 07/26/18   [provider]  Multiple Vitamins-Calcium (VIACTIV MULTI-VITAMIN) CHEW Chew 1 each by mouth daily.    [provider]  tiZANidine (ZANAFLEX) 2 MG tablet Take by mouth. 10/14/17   [provider]  traZODone (DESYREL) 100 MG tablet Take 1 tablet (100 mg total) by mouth at bedtime. 04/28/19   Arfeen, Arlyce Harman, MD  vitamin k 100 MCG tablet Take 100 mcg by mouth daily.    [provider]    Allergies    Sulfa antibiotics and Zolpidem tartrate  Review of Systems   Review of  Systems  Musculoskeletal: Positive for back pain and stiffness.  All other systems reviewed and are negative.   Physical Exam Updated Vital Signs BP 134/76 (BP Location: Left Arm)   Pulse 68   Temp 98 F (36.7 C) (Oral)   Resp 18   Ht 5\' 3"  (1.6 m)   SpO2 99%   BMI 29.05 kg/m   Physical Exam Vitals and nursing note reviewed.  Constitutional:      General: She is not in acute distress.    Appearance: Normal appearance. She is well-developed and normal weight.  HENT:     Head: Normocephalic and atraumatic.  Eyes:     Pupils: Pupils are equal, round, and reactive to light.  Cardiovascular:     Rate and Rhythm: Normal rate and regular rhythm.     Pulses: Normal pulses.     Heart sounds: Normal heart sounds. No murmur. No friction rub.  Pulmonary:     Effort: Pulmonary effort is normal.     Breath sounds: Normal breath sounds. No wheezing or rales.  Musculoskeletal:        General: Tenderness present. Normal range of motion.     Right hip: Bony tenderness present. Normal range of motion.     Right knee: Normal.     Right lower leg: No edema.     Left lower leg: No edema.       Legs:     Comments: No edema  Skin:    General: Skin is warm and dry.     Findings: No rash.  Neurological:     General: No focal deficit present.     Mental Status: She is alert and oriented to  person, place, and time. Mental status is at baseline.     Cranial Nerves: No cranial nerve deficit.  Psychiatric:        Mood and Affect: Mood normal.        Behavior: Behavior normal.        Thought Content: Thought content normal.     ED Results / Procedures / Treatments   Labs (all labs ordered are listed, but only abnormal results are displayed) Labs Reviewed - No data to display  EKG None  Radiology DG Hip Unilat W or Wo Pelvis 2-3 Views Right  Result Date: 10/21/2019 CLINICAL DATA:  Right hip pain radiating to the lower back and CP for 3 weeks. EXAM: DG HIP (WITH OR WITHOUT PELVIS) 2-3V RIGHT COMPARISON:  None. FINDINGS: No evidence of hip fracture or malalignment. No degenerative hip narrowing or spurring. Enthesophytes to the iliac crest and greater trochanter. No evidence of bone lesion. Osteopenic appearance. IMPRESSION: No acute finding or degenerative hip narrowing. Electronically Signed   By: Monte Fantasia M.D.   On: 10/21/2019 10:14    Procedures Procedures (including critical care time)  Medications Ordered in ED Medications - No data to display  ED Course  I have reviewed the triage vital signs and the nursing notes.  Pertinent labs & imaging results that were available during my care of the patient were reviewed by me and considered in my medical decision making (see chart for details).    MDM Rules/Calculators/A&P                      Elderly female with multiple medical problems including chronic back pain that gets injections regularly presenting today with right hip pain for the last 3 weeks that is nontraumatic.  Patient does have point tenderness  over her right iliac crest but no significant tenderness with lateral compression more concerning for bursitis.  Patient's pain may be related to known back disease and radiculopathy however also could be acute hip pathology.  Patient has no fever, skin changes or significant swelling.  Low concern for  infectious etiology.  We will do plain films to ensure no lesions or other cause for her pain.  Neurovascularly intact at this time and no symptoms to suggest cord compression as she is urinating and having bowel movements normally and denies any weakness in the leg.  She has been using Tylenol without relief.  10:29 AM Imaging of the right hip was ordered, reviewed and independently interpreted as well as radiology reading the film that showed no acute hip findings or significant degenerative changes.  Findings discussed with the patient.  We will try prednisone burst and lidocaine patches.  Also recommended she call Dr. Randel Pigg office  Final Clinical Impression(s) / ED Diagnoses Final diagnoses:  Acute right hip pain    Rx / DC Orders ED Discharge Orders         Ordered    predniSONE (DELTASONE) 20 MG tablet  Daily     10/21/19 1031    lidocaine (LIDODERM) 5 %  Every 24 hours     10/21/19 1031           Blanchie Dessert, MD 10/21/19 1031

## 2019-10-21 NOTE — Discharge Instructions (Signed)
Try to avoid excessive bending and twisting however it will be good to continue walking.  Also continue muscle relaxers and Tylenol.

## 2019-10-27 ENCOUNTER — Other Ambulatory Visit: Payer: Self-pay

## 2019-10-27 ENCOUNTER — Ambulatory Visit (HOSPITAL_COMMUNITY): Payer: Medicare Other | Admitting: Psychiatry

## 2020-04-06 ENCOUNTER — Other Ambulatory Visit (HOSPITAL_COMMUNITY): Payer: Self-pay | Admitting: Psychiatry

## 2020-04-06 DIAGNOSIS — F5101 Primary insomnia: Secondary | ICD-10-CM
